# Patient Record
Sex: Male | Born: 1935 | Race: White | Hispanic: No | Marital: Married | Smoking: Never smoker
Health system: Southern US, Community
[De-identification: ages and names within clinical notes are randomized; demographics above are authoritative.]

## PROBLEM LIST (undated history)

## (undated) DIAGNOSIS — D649 Anemia, unspecified: Secondary | ICD-10-CM

## (undated) DIAGNOSIS — I10 Essential (primary) hypertension: Secondary | ICD-10-CM

## (undated) DIAGNOSIS — R627 Adult failure to thrive: Secondary | ICD-10-CM

## (undated) DIAGNOSIS — K635 Polyp of colon: Secondary | ICD-10-CM

## (undated) DIAGNOSIS — E039 Hypothyroidism, unspecified: Secondary | ICD-10-CM

## (undated) DIAGNOSIS — N400 Enlarged prostate without lower urinary tract symptoms: Secondary | ICD-10-CM

## (undated) DIAGNOSIS — M75122 Complete rotator cuff tear or rupture of left shoulder, not specified as traumatic: Secondary | ICD-10-CM

## (undated) DIAGNOSIS — K5900 Constipation, unspecified: Secondary | ICD-10-CM

## (undated) DIAGNOSIS — T148XXA Other injury of unspecified body region, initial encounter: Secondary | ICD-10-CM

## (undated) DIAGNOSIS — L089 Local infection of the skin and subcutaneous tissue, unspecified: Secondary | ICD-10-CM

## (undated) DIAGNOSIS — K5790 Diverticulosis of intestine, part unspecified, without perforation or abscess without bleeding: Secondary | ICD-10-CM

## (undated) DIAGNOSIS — N3281 Overactive bladder: Secondary | ICD-10-CM

## (undated) DIAGNOSIS — J189 Pneumonia, unspecified organism: Secondary | ICD-10-CM

## (undated) DIAGNOSIS — M545 Low back pain: Secondary | ICD-10-CM

## (undated) DIAGNOSIS — L209 Atopic dermatitis, unspecified: Secondary | ICD-10-CM

## (undated) DIAGNOSIS — F0390 Unspecified dementia without behavioral disturbance: Secondary | ICD-10-CM

## (undated) DIAGNOSIS — I70219 Atherosclerosis of native arteries of extremities with intermittent claudication, unspecified extremity: Secondary | ICD-10-CM

## (undated) HISTORY — DX: Adult failure to thrive: R62.7

## (undated) HISTORY — DX: Atherosclerosis of native arteries of extremities with intermittent claudication, unspecified extremity: I70.219

## (undated) HISTORY — DX: Benign prostatic hyperplasia without lower urinary tract symptoms: N40.0

## (undated) HISTORY — DX: Overactive bladder: N32.81

## (undated) HISTORY — DX: Essential (primary) hypertension: I10

## (undated) HISTORY — DX: Constipation, unspecified: K59.00

## (undated) HISTORY — DX: Low back pain: M54.5

## (undated) HISTORY — DX: Anemia, unspecified: D64.9

## (undated) HISTORY — DX: Complete rotator cuff tear or rupture of left shoulder, not specified as traumatic: M75.122

## (undated) HISTORY — DX: Hypothyroidism, unspecified: E03.9

## (undated) HISTORY — PX: COLONOSCOPY: SHX174

## (undated) HISTORY — DX: Local infection of the skin and subcutaneous tissue, unspecified: L08.9

## (undated) HISTORY — DX: Diverticulosis of intestine, part unspecified, without perforation or abscess without bleeding: K57.90

## (undated) HISTORY — DX: Pneumonia, unspecified organism: J18.9

## (undated) HISTORY — DX: Atopic dermatitis, unspecified: L20.9

## (undated) HISTORY — DX: Other injury of unspecified body region, initial encounter: T14.8XXA

## (undated) HISTORY — DX: Polyp of colon: K63.5

## (undated) HISTORY — PX: EYE SURGERY: SHX253

---

## 1970-06-09 HISTORY — PX: HEMORRHOID SURGERY: SHX153

## 1990-07-10 HISTORY — PX: VITRECTOMY: SHX106

## 1995-04-10 HISTORY — PX: INGUINAL HERNIA REPAIR: SUR1180

## 2001-01-08 ENCOUNTER — Other Ambulatory Visit: Admission: RE | Admit: 2001-01-08 | Discharge: 2001-01-08 | Payer: Self-pay | Admitting: Gastroenterology

## 2001-01-08 ENCOUNTER — Encounter (INDEPENDENT_AMBULATORY_CARE_PROVIDER_SITE_OTHER): Payer: Self-pay | Admitting: Specialist

## 2002-12-29 ENCOUNTER — Encounter: Admission: RE | Admit: 2002-12-29 | Discharge: 2002-12-29 | Payer: Self-pay | Admitting: Endocrinology

## 2002-12-29 ENCOUNTER — Encounter: Payer: Self-pay | Admitting: Endocrinology

## 2006-07-24 ENCOUNTER — Inpatient Hospital Stay (HOSPITAL_COMMUNITY): Admission: EM | Admit: 2006-07-24 | Discharge: 2006-07-31 | Payer: Self-pay | Admitting: Emergency Medicine

## 2006-10-15 ENCOUNTER — Ambulatory Visit: Payer: Self-pay | Admitting: Gastroenterology

## 2006-10-16 ENCOUNTER — Encounter: Admission: RE | Admit: 2006-10-16 | Discharge: 2006-10-16 | Payer: Self-pay | Admitting: Endocrinology

## 2006-11-03 ENCOUNTER — Ambulatory Visit: Payer: Self-pay | Admitting: Gastroenterology

## 2007-12-05 ENCOUNTER — Encounter: Admission: RE | Admit: 2007-12-05 | Discharge: 2007-12-05 | Payer: Self-pay | Admitting: Endocrinology

## 2009-10-07 DEATH — deceased

## 2010-08-05 ENCOUNTER — Other Ambulatory Visit: Payer: MEDICARE

## 2010-08-05 ENCOUNTER — Other Ambulatory Visit: Payer: Self-pay | Admitting: Internal Medicine

## 2010-08-05 ENCOUNTER — Ambulatory Visit (INDEPENDENT_AMBULATORY_CARE_PROVIDER_SITE_OTHER): Payer: MEDICARE | Admitting: Internal Medicine

## 2010-08-05 ENCOUNTER — Encounter: Payer: Self-pay | Admitting: Internal Medicine

## 2010-08-05 DIAGNOSIS — E039 Hypothyroidism, unspecified: Secondary | ICD-10-CM

## 2010-08-05 DIAGNOSIS — I10 Essential (primary) hypertension: Secondary | ICD-10-CM

## 2010-08-05 DIAGNOSIS — D649 Anemia, unspecified: Secondary | ICD-10-CM

## 2010-08-05 DIAGNOSIS — D518 Other vitamin B12 deficiency anemias: Secondary | ICD-10-CM

## 2010-08-05 HISTORY — DX: Essential (primary) hypertension: I10

## 2010-08-05 HISTORY — DX: Hypothyroidism, unspecified: E03.9

## 2010-08-05 LAB — CBC WITH DIFFERENTIAL/PLATELET
Eosinophils Relative: 2.7 % (ref 0.0–5.0)
MCV: 103 fl — ABNORMAL HIGH (ref 78.0–100.0)
Monocytes Absolute: 0.7 10*3/uL (ref 0.1–1.0)
Monocytes Relative: 12.1 % — ABNORMAL HIGH (ref 3.0–12.0)
Neutrophils Relative %: 53 % (ref 43.0–77.0)
Platelets: 192 10*3/uL (ref 150.0–400.0)
WBC: 5.7 10*3/uL (ref 4.5–10.5)

## 2010-08-05 LAB — BASIC METABOLIC PANEL
BUN: 25 mg/dL — ABNORMAL HIGH (ref 6–23)
Calcium: 9.7 mg/dL (ref 8.4–10.5)
Creatinine, Ser: 1 mg/dL (ref 0.4–1.5)
GFR: 77.16 mL/min (ref >60.00)

## 2010-08-05 LAB — B12 AND FOLATE PANEL: Vitamin B-12: 488 pg/mL (ref 211–911)

## 2010-08-05 LAB — TSH: TSH: 1.45 u[IU]/mL (ref 0.35–5.50)

## 2010-08-12 ENCOUNTER — Telehealth: Payer: Self-pay | Admitting: Internal Medicine

## 2010-08-13 ENCOUNTER — Ambulatory Visit: Payer: MEDICARE

## 2010-08-15 NOTE — Assessment & Plan Note (Signed)
Summary: NEW/ MEDICARE /[place]   Vital Signs:  Patient profile:   77 year old male Height:      71 inches Weight:      135 pounds BMI:     18.90 O2 Sat:      98 % on Room air Temp:     97.3 degrees F oral Pulse rate:   67 / minute Pulse rhythm:   regular Resp:     16 per minute BP sitting:   120 / 70  (left arm) Cuff size:   regular  Vitals Entered By: Lamar Sprinkles, CMA (August 05, 2010 3:06 PM)  O2 Flow:  Room air CC: New to est/SD Is Patient Diabetic? No Pain Assessment Patient in pain? no        Primary Care Provider:  Etta Grandchild MD  CC:  New to est/SD.  History of Present Illness: New to me he needs a new PCP. Follow-Up Visit      This is an 77 year old man who presents for Follow-up visit.  The patient denies chest pain, palpitations, dizziness, syncope, low blood sugar symptoms, high blood sugar symptoms, edema, SOB, DOE, PND, and orthopnea.  Since the last visit the patient notes no new problems or concerns.  The patient reports taking meds as prescribed, monitoring BP, and dietary compliance.  When questioned about possible medication side effects, the patient notes none.    Preventive Screening-Counseling & Management  Alcohol-Tobacco     Alcohol drinks/day: <1     Alcohol type: beer     >5/day in last 3 mos: no     Alcohol Counseling: not indicated; use of alcohol is not excessive or problematic     Feels need to cut down: no     Feels annoyed by complaints: no     Feels guilty re: drinking: no     Needs 'eye opener' in am: no     Smoking Status: never     Tobacco Counseling: not indicated; no tobacco use  Caffeine-Diet-Exercise     Does Patient Exercise: yes  Hep-HIV-STD-Contraception     Hepatitis Risk: no risk noted     HIV Risk: risk noted     STD Risk: no risk noted      Sexual History:  currently monogamous.        Drug Use:  yes.        Blood Transfusions:  no.    Medications Prior to Update: 1)  None  Current Medications  (verified): 1)  Synthroid 75 Mcg Tabs (Levothyroxine Sodium) .Marland Kitchen.. 1 Once Daily 2)  Triamterene-Hctz 37.5-25 Mg Tabs (Triamterene-Hctz) .... 1/2 Once Daily  Allergies (verified): No Known Drug Allergies  Past History:  Past Medical History: Anemia-NOS Hypertension Hypothyroidism  Past Surgical History: Denies surgical history  Family History: Family History Lung cancer  Social History: Retired Married Never Smoked Alcohol use-yes Drug use-yes Regular exercise-yes Smoking Status:  never Drug Use:  yes Does Patient Exercise:  yes Hepatitis Risk:  no risk noted HIV Risk:  risk noted STD Risk:  no risk noted Sexual History:  currently monogamous Blood Transfusions:  no  Review of Systems  The patient denies anorexia, fever, weight loss, weight gain, chest pain, syncope, dyspnea on exertion, peripheral edema, prolonged cough, headaches, hemoptysis, abdominal pain, hematuria, suspicious skin lesions, transient blindness, difficulty walking, depression, enlarged lymph nodes, and angioedema.   Endo:  Denies cold intolerance, excessive hunger, excessive thirst, excessive urination, heat intolerance, polyuria, and weight change. Heme:  Denies abnormal bruising, bleeding, enlarge lymph nodes, fevers, pallor, and skin discoloration.  Physical Exam  General:  alert, well-developed, well-nourished, well-hydrated, appropriate dress, normal appearance, healthy-appearing, cooperative to examination, good hygiene, and underweight appearing.   Head:  normocephalic, atraumatic, no abnormalities observed, and no abnormalities palpated.   Eyes:  vision grossly intact, pupils equal, pupils round, and pupils reactive to light.   Ears:  R ear normal and L ear normal.   Nose:  External nasal examination shows no deformity or inflammation. Nasal mucosa are pink and moist without lesions or exudates. Mouth:  Oral mucosa and oropharynx without lesions or exudates.  Teeth in good repair. Neck:   supple, full ROM, no masses, no thyromegaly, no thyroid nodules or tenderness, no JVD, no HJR, and normal carotid upstroke.   Lungs:  normal respiratory effort, no intercostal retractions, no accessory muscle use, normal breath sounds, no dullness, no fremitus, no crackles, and no wheezes.   Heart:  normal rate, regular rhythm, no murmur, no gallop, no rub, and no JVD.   Abdomen:  soft, non-tender, normal bowel sounds, no distention, no masses, no guarding, no rigidity, no rebound tenderness, no abdominal hernia, no inguinal hernia, no hepatomegaly, and no splenomegaly.   Msk:  No deformity or scoliosis noted of thoracic or lumbar spine.   Pulses:  R and L carotid,radial,femoral,dorsalis pedis and posterior tibial pulses are full and equal bilaterally Extremities:  No clubbing, cyanosis, edema, or deformity noted with normal full range of motion of all joints.   Neurologic:  No cranial nerve deficits noted. Station and gait are normal. Plantar reflexes are down-going bilaterally. DTRs are symmetrical throughout. Sensory, motor and coordinative functions appear intact. Skin:  Intact without suspicious lesions or rashes Cervical Nodes:  No lymphadenopathy noted Psych:  Cognition and judgment appear intact. Alert and cooperative with normal attention span and concentration. No apparent delusions, illusions, hallucinations   Impression & Recommendations:  Problem # 1:  HYPOTHYROIDISM (ICD-244.9) Assessment Unchanged  His updated medication list for this problem includes:    Synthroid 75 Mcg Tabs (Levothyroxine sodium) .Marland Kitchen... 1 once daily  Orders: Venipuncture (34742) TLB-BMP (Basic Metabolic Panel-BMET) (80048-METABOL) TLB-CBC Platelet - w/Differential (85025-CBCD) TLB-TSH (Thyroid Stimulating Hormone) (84443-TSH) TLB-B12 + Folate Pnl (59563_87564-P32/RJJ)  Problem # 2:  HYPERTENSION (ICD-401.9) Assessment: Unchanged  His updated medication list for this problem includes:     Triamterene-hctz 37.5-25 Mg Tabs (Triamterene-hctz) .Marland Kitchen... 1/2 once daily  Orders: Venipuncture (88416) TLB-BMP (Basic Metabolic Panel-BMET) (80048-METABOL) TLB-CBC Platelet - w/Differential (85025-CBCD) TLB-TSH (Thyroid Stimulating Hormone) (84443-TSH) TLB-B12 + Folate Pnl (60630_16010-X32/TFT)  BP today: 120/70  Problem # 3:  ANEMIA, B12 DEFICIENCY (ICD-281.1) Assessment: Unchanged  Orders: Venipuncture (73220) TLB-BMP (Basic Metabolic Panel-BMET) (80048-METABOL) TLB-CBC Platelet - w/Differential (85025-CBCD) TLB-TSH (Thyroid Stimulating Hormone) (84443-TSH) TLB-B12 + Folate Pnl (25427_06237-S28/BTD)  Complete Medication List: 1)  Synthroid 75 Mcg Tabs (Levothyroxine sodium) .Marland Kitchen.. 1 once daily 2)  Triamterene-hctz 37.5-25 Mg Tabs (Triamterene-hctz) .... 1/2 once daily  Colorectal Screening:  Current Recommendations:    Hemoccult: patient refused  Colonoscopy Results:    Date of Exam: 08/24/2007    Results: Normal  Immunization & Chemoprophylaxis:    Tetanus vaccine: Tdap (State)  (09/08/2007)    Influenza vaccine: Historical  (03/14/2010)    Pneumovax: Pneumovax (Medicare)  (03/10/2007)  Patient Instructions: 1)  Please schedule a follow-up appointment in 4 months. 2)  Check your Blood Pressure regularly. If it is above 130/80: you should make an appointment.   Orders Added: 1)  Venipuncture [17616] 2)  TLB-BMP (Basic Metabolic Panel-BMET) [80048-METABOL] 3)  TLB-CBC Platelet - w/Differential [85025-CBCD] 4)  TLB-TSH (Thyroid Stimulating Hormone) [84443-TSH] 5)  TLB-B12 + Folate Pnl [82746_82607-B12/FOL] 6)  New Patient Level IV [16109]   Immunization History:  Tetanus/Td Immunization History:    Tetanus/Td:  tdap (state) (09/08/2007)  Influenza Immunization History:    Influenza:  historical (03/14/2010)  Pneumovax Immunization History:    Pneumovax:  pneumovax (medicare) (03/10/2007)   Immunization History:  Tetanus/Td Immunization History:     Tetanus/Td:  Tdap (State) (09/08/2007)  Influenza Immunization History:    Influenza:  Historical (03/14/2010)  Pneumovax Immunization History:    Pneumovax:  Pneumovax (Medicare) (03/10/2007)

## 2010-08-20 NOTE — Progress Notes (Signed)
Summary: B12?   Phone Note Call from Patient Call back at [facility] Phone [phone]   Summary of Call: Pt states that he gets b12 injections once monthly. OK to schedule nurse visit for this?  Initial call taken by: Lamar Sprinkles, CMA,  August 12, 2010 11:06 AM  Follow-up for Phone Call        yes Follow-up by: Etta Grandchild MD,  August 12, 2010 11:26 AM  Additional Follow-up for Phone Call Additional follow up Details #1::        Please set pt up for b12 nurse visit once monthly. THANKS Additional Follow-up by: Lamar Sprinkles, CMA,  August 12, 2010 11:31 AM    Additional Follow-up for Phone Call Additional follow up Details #2::    PT IS COMING TUES AT 10 FOR B12. Follow-up by: Hilarie Fredrickson,  August 12, 2010 4:16 PM

## 2010-09-09 ENCOUNTER — Ambulatory Visit (INDEPENDENT_AMBULATORY_CARE_PROVIDER_SITE_OTHER): Payer: MEDICARE | Admitting: *Deleted

## 2010-09-09 DIAGNOSIS — D518 Other vitamin B12 deficiency anemias: Secondary | ICD-10-CM

## 2010-09-09 DIAGNOSIS — D519 Vitamin B12 deficiency anemia, unspecified: Secondary | ICD-10-CM

## 2010-09-09 MED ORDER — CYANOCOBALAMIN 1000 MCG/ML IJ SOLN
1000.0000 ug | Freq: Once | INTRAMUSCULAR | Status: AC
  Administered 2010-09-09: 1000 ug via INTRAMUSCULAR

## 2010-09-19 ENCOUNTER — Telehealth: Payer: Self-pay | Admitting: Internal Medicine

## 2010-09-19 NOTE — Telephone Encounter (Signed)
Forwarded to Dr. Jones for review. °

## 2010-10-09 ENCOUNTER — Ambulatory Visit (INDEPENDENT_AMBULATORY_CARE_PROVIDER_SITE_OTHER): Payer: MEDICARE | Admitting: *Deleted

## 2010-10-09 DIAGNOSIS — E538 Deficiency of other specified B group vitamins: Secondary | ICD-10-CM

## 2010-10-09 DIAGNOSIS — D518 Other vitamin B12 deficiency anemias: Secondary | ICD-10-CM

## 2010-10-09 MED ORDER — CYANOCOBALAMIN 1000 MCG/ML IJ SOLN
1000.0000 ug | Freq: Once | INTRAMUSCULAR | Status: AC
  Administered 2010-10-09: 1000 ug via INTRAMUSCULAR

## 2010-10-25 NOTE — Op Note (Signed)
NAME:  Connor Miller, TANT NO.:  [id number]   MEDICAL RECORD NO.:  [id number]          PATIENT TYPE:  INP   LOCATION:  5013                         FACILITY:  [facility]   PHYSICIAN:  Mark C. Ophelia Charter, M.D.    DATE OF BIRTH:  [birth year]   DATE OF PROCEDURE:  DATE OF DISCHARGE:  07/31/2006                               OPERATIVE REPORT   FINAL DIAGNOSES:  1. Left intertrochanteric hip fracture after a fall.  2. Acute blood loss anemia secondary to intertrochanteric hip      fracture.  3. Hypothyroidism.  4. Hypertension.  5. History of glaucoma.   This 77 year old male who is active and is a good ambulator, was  trimming some trees, fell off a 4-foot ladder suffering left  intertrochanteric hip fracture.   ADMISSION MEDICATIONS:  Include:  1. Synthroid 0.1 mg daily.  2. Triamterene 37.5 mg.  3. Cosopt left eye drops 1 drop b.i.d.   He was admitted and after informed consent, he was taken to the  operating room and underwent intermedullary hip screw fixation on  July 24, 2006 with a long Synthes intramedullary rod that went down  to the patella.  X-rays postop looked good.  Postop hemoglobin was 9.2.  Due to a severe comminuted fracture, it was decided by the operating  surgeon, Dr. Ophelia Charter, to put the patient on subtherapeutic Coumadin  treatment due to expected blood loss from the severe comminuted fracture  and high energy of impact.  He did have a glucose of 112.  He was placed  on an ADA diet.  Followup hemoglobin the next day with an INR of 1.3  showed his hemoglobin dropped to 7.7 with hematocrit of 21.8 and he  required transfusion.  Post transfusion of 2 units of blood, his  hemoglobin was 10, INR remained 1.3.  There was no evidence of DVT.  He  was ambulatory 10 feet.  Foley was removed on postop day #3.  He was  voiding well.  Hemoglobin remained stable at 10.  He was seen by PT, OT  and made excellent progress.  He was ambulatory down the halls with a  walker.   PLAN:  Coumadin 1 mg daily x3 weeks which is subtherapeutic as planned.  INR was stable 1.3.  He was taking Tylox for pain.  X-ray showed  excellent position, wound was dry.  Office followup 1 week after  discharge to see Dr. Ophelia Charter.   CONDITION AT DISCHARGE:  Improved.   DISCHARGE MEDICATIONS:  Same as admission medications with the addition  of Coumadin 1 mg daily x3 weeks and Tylox for pain.      Mark C. Ophelia Charter, M.D.  Electronically Signed     MCY/MEDQ  D:  09/04/2006  T:  09/04/2006  Job:  [id number]

## 2010-10-25 NOTE — Op Note (Signed)
NAME:  Connor Miller, Connor Miller NO.:  [id number]   MEDICAL RECORD NO.:  [id number]          PATIENT TYPE:  INP   LOCATION:  1843                         FACILITY:  [facility]   PHYSICIAN:  Mark C. Ophelia Charter, M.D.    DATE OF BIRTH:  08-05-1922   DATE OF PROCEDURE:  07/24/2006  DATE OF DISCHARGE:                               OPERATIVE REPORT   PREOPERATIVE DIAGNOSIS:  Left four-part intertrochanteric hip fracture.   POSTOPERATIVE DIAGNOSIS:  Left four-part intertrochanteric hip fracture.   PROCEDURE PERFORMED:  Left intramedullary hip screw.   SURGEON:  Mark C. Ophelia Charter, M.D.   ANESTHESIA:  GOT.   ESTIMATED BLOOD LOSS:  100 mL.   COMPONENTS USED:  Synthes intramedullary hip screw 110 lag screw, 11 mm  x 420 mm nail.   PROCEDURE:  After induction of general anesthesia, reduction  confirmation under fluoroscopy with traction internal rotation, AP and  lateral x-ray were checked, fluoroscopic views with good positioning of  the lamina and standard prep with DuraPrep, the area was spread with  towels, large shower curtain, Betadine body Vi-Drape was applied.  Timeout was taken.  Preoperative Ancef had been given.  Incision was  made proximal to trochanter.  Fascia over the gluteus medius was split.  K wire was drilled into the appropriate position. Intramedullary reaming  was performed and then measurement over the top with a long ruler and  placement of a 420 mm nail.  The nail went down to the mid portion of  the patella on AP view.  It was currently tight in the intramedullary  canal.  Incision was made laterally and the thumb screw was pushed,  tightened down securely against the lateral cortex, reamed, measured and  then 110 mm lag screw was then placed center-center on AP and lateral  with good position and alignment.  After locking the lag screw with hex  head, thumb bolt was pushed.  K wire was removed from its placement  center-center in the head.  Entire apparatus AP and  lateral was checked  under fluoroscopy with excellent position.  Spinal films taken for  confirmation.  Wound irrigated, closed with 0 Vicryl in the fascia, 2-0  in subcutaneous tissue, skin stapled closed after Marcaine infiltration  in the skin and postop dressing.  Instrument count and needle count was  correct.      Mark C. Ophelia Charter, M.D.  Electronically Signed     MCY/MEDQ  D:  07/24/2006  T:  07/25/2006  Job:  [id number]

## 2010-11-12 ENCOUNTER — Encounter: Payer: Self-pay | Admitting: Internal Medicine

## 2010-11-13 ENCOUNTER — Encounter: Payer: Self-pay | Admitting: Internal Medicine

## 2010-11-13 ENCOUNTER — Other Ambulatory Visit (INDEPENDENT_AMBULATORY_CARE_PROVIDER_SITE_OTHER): Payer: MEDICARE

## 2010-11-13 ENCOUNTER — Ambulatory Visit (INDEPENDENT_AMBULATORY_CARE_PROVIDER_SITE_OTHER): Payer: MEDICARE | Admitting: Internal Medicine

## 2010-11-13 VITALS — BP 114/70 | HR 59 | Temp 97.4°F | Resp 16 | Wt 137.0 lb

## 2010-11-13 DIAGNOSIS — I10 Essential (primary) hypertension: Secondary | ICD-10-CM

## 2010-11-13 DIAGNOSIS — D649 Anemia, unspecified: Secondary | ICD-10-CM

## 2010-11-13 DIAGNOSIS — E039 Hypothyroidism, unspecified: Secondary | ICD-10-CM

## 2010-11-13 DIAGNOSIS — D519 Vitamin B12 deficiency anemia, unspecified: Secondary | ICD-10-CM

## 2010-11-13 DIAGNOSIS — D518 Other vitamin B12 deficiency anemias: Secondary | ICD-10-CM

## 2010-11-13 LAB — CBC WITH DIFFERENTIAL/PLATELET
Basophils Relative: 0.3 % (ref 0.0–3.0)
Eosinophils Relative: 2.5 % (ref 0.0–5.0)
HCT: 36.7 % — ABNORMAL LOW (ref 39.0–52.0)
Hemoglobin: 12.6 g/dL — ABNORMAL LOW (ref 13.0–17.0)
Lymphs Abs: 1.6 10*3/uL (ref 0.7–4.0)
Monocytes Relative: 13.1 % — ABNORMAL HIGH (ref 3.0–12.0)
Neutro Abs: 2.3 10*3/uL (ref 1.4–7.7)
RBC: 3.57 Mil/uL — ABNORMAL LOW (ref 4.22–5.81)
RDW: 14.3 % (ref 11.5–14.6)
WBC: 4.7 10*3/uL (ref 4.5–10.5)

## 2010-11-13 LAB — TSH: TSH: 1.35 u[IU]/mL (ref 0.35–5.50)

## 2010-11-13 LAB — COMPREHENSIVE METABOLIC PANEL
ALT: 17 U/L (ref 0–53)
CO2: 33 mEq/L — ABNORMAL HIGH (ref 19–32)
Creatinine, Ser: 1.1 mg/dL (ref 0.4–1.5)
GFR: 70.44 mL/min (ref >60.00)
Total Bilirubin: 0.6 mg/dL (ref 0.3–1.2)

## 2010-11-13 MED ORDER — CYANOCOBALAMIN 1000 MCG/ML IJ SOLN
1000.0000 ug | Freq: Once | INTRAMUSCULAR | Status: AC
  Administered 2010-11-13: 1000 ug via INTRAMUSCULAR

## 2010-11-13 NOTE — Assessment & Plan Note (Signed)
He got a B12 injection today

## 2010-11-13 NOTE — Assessment & Plan Note (Signed)
I will check his CBC today

## 2010-11-13 NOTE — Assessment & Plan Note (Signed)
His fatigue is concerning, I will check his TSH level today

## 2010-11-13 NOTE — Patient Instructions (Signed)
Hypothyroidism The thyroid is a large gland located in the lower front of your neck. The thyroid gland helps control metabolism. Metabolism is how your body handles food. It controls metabolism with the hormone thyroxine. When this gland is underactive (hypothyroid), it produces too little hormone.  SYMPTOMS OF HYPOTHYROIDISM  Lethargy (feeling as though you have no energy)   Cold intolerance   Weight gain (in spite of normal food intake)   Dry skin   Coarse hair   Menstrual irregularity (if severe, may lead to infertility)   Slowing of thought processes  Cardiac problems are also caused by insufficient amounts of thyroid hormone. Hypothyroidism in the newborn is cretinism, and is an extreme form. It is important that this form be treated adequately and immediately or it will lead rapidly to retarded physical and mental development. CAUSES OF HYPOTHYROIDISM These include:   Absence or destruction of thyroid tissue.  Goiter due to iodine deficiency.   Goiter due to medications.   Congenital defects (since birth).  Problems with the pituitary. This causes a lack of TSH (thyroid stimulating hormone). This hormone tells the thyroid to turn out more hormone.   DIAGNOSIS To prove hypothyroidism, your caregiver may do blood tests and ultrasound tests. Sometimes the signs are hidden. It may be necessary for your caregiver to watch this illness with blood tests either before or after diagnosis and treatment. TREATMENT  Low levels of thyroid hormone are increased by using synthetic thyroid hormone. This is a safe, effective treatment. It usually takes about four weeks to gain the full effects of the medication. After you have the full effect of the medication, it will generally take another four weeks for problems to leave. Your caregiver may start you on low doses. If you have had heart problems the dose may be gradually increased. It is generally not an emergency to get rapidly to  normal. HOME CARE INSTRUCTIONS  Take your medications as your caregiver suggests. Let your caregiver know of any medications you are taking or start taking. Your caregiver will help you with dosage schedules.   As your condition improves, your dosage needs may increase. It will be necessary to have continuing blood tests as suggested by your caregiver.   Report all suspected medication side effects to your caregiver.  SEEK MEDICAL CARE IF YOU DEVELOP:  Sweating.  Tremulousness (tremors).   Anxiety.   Rapid weight loss.   Heat intolerance.  Emotional swings.   Diarrhea.   Weakness.   SEEK IMMEDIATE MEDICAL CARE IF: You develop chest pain, an irregular heart beat (palpitations), or a rapid heart beat. MAKE SURE YOU:   Understand these instructions.   Will watch your condition.   Will get help right away if you are not doing well or get worse.  Document Released: 05/26/2005 Document Re-Released: 05/08/2008 [employer] Patient Information 2011 [employer]. 

## 2010-11-13 NOTE — Progress Notes (Signed)
Subjective:    Patient ID: Connor Miller, male    DOB: 09/12/1922, 77 y.o.   MRN: [id number]  Thyroid Problem Presents for follow-up visit. Symptoms include fatigue. Patient reports no anxiety, cold intolerance, constipation, depressed mood, diaphoresis, diarrhea, dry skin, hair loss, heat intolerance, hoarse voice, leg swelling, menstrual problem, nail problem, palpitations, tremors, visual change, weight gain or weight loss. The symptoms have been stable.  Hypertension This is a chronic problem. The current episode started more than 1 year ago. The problem has been gradually improving since onset. The problem is controlled. Pertinent negatives include no anxiety, blurred vision, chest pain, headaches, malaise/fatigue, neck pain, orthopnea, palpitations, peripheral edema, PND, shortness of breath or sweats. Past treatments include diuretics. The current treatment provides significant improvement. There are no compliance problems.  Hypertensive end-organ damage includes a thyroid problem.      Review of Systems  Constitutional: Positive for fatigue. Negative for fever, chills, weight loss, weight gain, malaise/fatigue, diaphoresis, activity change, appetite change and unexpected weight change.  HENT: Negative for sore throat, hoarse voice, facial swelling, trouble swallowing, neck pain, neck stiffness and voice change.   Eyes: Negative for blurred vision, photophobia and visual disturbance.  Respiratory: Negative for apnea, cough, choking, chest tightness, shortness of breath, wheezing and stridor.   Cardiovascular: Negative for chest pain, palpitations, orthopnea, leg swelling and PND.  Gastrointestinal: Negative for nausea, vomiting, abdominal pain, diarrhea, constipation and blood in stool.  Genitourinary: Negative for dysuria, urgency, frequency, hematuria, flank pain, decreased urine volume, enuresis, difficulty urinating, genital sores and menstrual problem.  Musculoskeletal: Negative for  myalgias, back pain, joint swelling, arthralgias and gait problem.  Skin: Negative for color change, pallor and rash.  Neurological: Negative for dizziness, tremors, seizures, syncope, facial asymmetry, speech difficulty, weakness, light-headedness, numbness and headaches.  Hematological: Negative for cold intolerance, heat intolerance and adenopathy. Does not bruise/bleed easily.  Psychiatric/Behavioral: Negative.        Objective:   Physical Exam  [vitalsreviewed. Constitutional: He is oriented to person, place, and time. He appears well-developed and well-nourished. No distress.  HENT:  Head: Normocephalic and atraumatic.  Right Ear: External ear normal.  Left Ear: External ear normal.  Nose: Nose normal.  Mouth/Throat: Oropharynx is clear and moist. No oropharyngeal exudate.  Eyes: Conjunctivae and EOM are normal. Pupils are equal, round, and reactive to light. Right eye exhibits no discharge. Left eye exhibits no discharge. No scleral icterus.  Neck: Normal range of motion. Neck supple. No JVD present. No tracheal deviation present. No thyromegaly present.  Cardiovascular: Normal rate, regular rhythm, normal heart sounds and intact distal pulses.  Exam reveals no gallop and no friction rub.   No murmur heard. Pulmonary/Chest: Effort normal and breath sounds normal. No stridor. No respiratory distress. He has no wheezes. He has no rales. He exhibits no tenderness.  Abdominal: Soft. Bowel sounds are normal. He exhibits no distension and no mass. There is no tenderness. There is no rebound and no guarding.  Musculoskeletal: Normal range of motion. He exhibits no edema and no tenderness.  Lymphadenopathy:    He has no cervical adenopathy.  Neurological: He is alert and oriented to person, place, and time. He has normal reflexes. He displays normal reflexes. No cranial nerve deficit. He exhibits normal muscle tone. Coordination normal.  Skin: Skin is warm and dry. No rash noted. He is not  diaphoretic. No erythema. No pallor.  Psychiatric: He has a normal mood and affect. His behavior is normal. Judgment and thought content  normal.        Lab Results  Component Value Date   WBC 5.7 08/05/2010   HGB 13.0 08/05/2010   HCT 37.9* 08/05/2010   PLT 192.0 08/05/2010   NA 141 08/05/2010   K 4.3 08/05/2010   CL 103 08/05/2010   CREATININE 1.0 08/05/2010   BUN 25* 08/05/2010   CO2 33* 08/05/2010   TSH 1.45 08/05/2010    Assessment & Plan:

## 2010-11-13 NOTE — Assessment & Plan Note (Signed)
His BP is well controlled, today I will monitor his lytes and renal function 

## 2010-11-14 ENCOUNTER — Encounter: Payer: Self-pay | Admitting: Internal Medicine

## 2010-11-19 ENCOUNTER — Other Ambulatory Visit: Payer: Self-pay | Admitting: Dermatology

## 2010-12-12 ENCOUNTER — Ambulatory Visit (INDEPENDENT_AMBULATORY_CARE_PROVIDER_SITE_OTHER): Payer: MEDICARE

## 2010-12-12 DIAGNOSIS — D518 Other vitamin B12 deficiency anemias: Secondary | ICD-10-CM

## 2010-12-12 DIAGNOSIS — D519 Vitamin B12 deficiency anemia, unspecified: Secondary | ICD-10-CM

## 2010-12-12 MED ORDER — CYANOCOBALAMIN 1000 MCG/ML IJ SOLN
1000.0000 ug | Freq: Once | INTRAMUSCULAR | Status: AC
  Administered 2010-12-12: 1000 ug via INTRAMUSCULAR

## 2010-12-13 ENCOUNTER — Ambulatory Visit: Payer: MEDICARE

## 2011-01-08 ENCOUNTER — Ambulatory Visit (INDEPENDENT_AMBULATORY_CARE_PROVIDER_SITE_OTHER): Payer: MEDICARE | Admitting: *Deleted

## 2011-01-08 DIAGNOSIS — D518 Other vitamin B12 deficiency anemias: Secondary | ICD-10-CM

## 2011-01-08 MED ORDER — CYANOCOBALAMIN 1000 MCG/ML IJ SOLN
1000.0000 ug | Freq: Once | INTRAMUSCULAR | Status: AC
  Administered 2011-01-08: 1000 ug via INTRAMUSCULAR

## 2011-02-11 ENCOUNTER — Ambulatory Visit (INDEPENDENT_AMBULATORY_CARE_PROVIDER_SITE_OTHER): Payer: MEDICARE

## 2011-02-11 DIAGNOSIS — E538 Deficiency of other specified B group vitamins: Secondary | ICD-10-CM

## 2011-02-11 MED ORDER — CYANOCOBALAMIN 1000 MCG/ML IJ SOLN
1000.0000 ug | Freq: Once | INTRAMUSCULAR | Status: AC
  Administered 2011-02-11: 1000 ug via INTRAMUSCULAR

## 2011-03-19 ENCOUNTER — Ambulatory Visit (INDEPENDENT_AMBULATORY_CARE_PROVIDER_SITE_OTHER): Payer: MEDICARE | Admitting: Internal Medicine

## 2011-03-19 ENCOUNTER — Encounter: Payer: Self-pay | Admitting: Internal Medicine

## 2011-03-19 VITALS — BP 110/78 | HR 64 | Temp 97.5°F | Resp 16 | Wt 134.0 lb

## 2011-03-19 DIAGNOSIS — D649 Anemia, unspecified: Secondary | ICD-10-CM

## 2011-03-19 DIAGNOSIS — D518 Other vitamin B12 deficiency anemias: Secondary | ICD-10-CM

## 2011-03-19 DIAGNOSIS — I10 Essential (primary) hypertension: Secondary | ICD-10-CM

## 2011-03-19 DIAGNOSIS — E538 Deficiency of other specified B group vitamins: Secondary | ICD-10-CM

## 2011-03-19 DIAGNOSIS — E039 Hypothyroidism, unspecified: Secondary | ICD-10-CM

## 2011-03-19 DIAGNOSIS — Z23 Encounter for immunization: Secondary | ICD-10-CM

## 2011-03-19 MED ORDER — TRIAMTERENE-HCTZ 37.5-25 MG PO CAPS: 1.0000 | ORAL_CAPSULE | Freq: Every day | ORAL | Status: DC

## 2011-03-19 MED ORDER — CYANOCOBALAMIN 1000 MCG/ML IJ SOLN
1000.0000 ug | Freq: Once | INTRAMUSCULAR | Status: AC
  Administered 2011-03-19: 1000 ug via INTRAMUSCULAR

## 2011-03-19 MED ORDER — LEVOTHYROXINE SODIUM 75 MCG PO TABS: 75.0000 ug | ORAL_TABLET | Freq: Every day | ORAL | Status: DC

## 2011-03-19 NOTE — Assessment & Plan Note (Signed)
This is stable 

## 2011-03-19 NOTE — Patient Instructions (Signed)

## 2011-03-19 NOTE — Assessment & Plan Note (Signed)
Continue current synthroid dose.

## 2011-03-19 NOTE — Assessment & Plan Note (Signed)
BP is well controlled 

## 2011-03-19 NOTE — Progress Notes (Signed)
Subjective:    Patient ID: Connor Miller, male    DOB: 02/01/1923, 78 y.o.   MRN: [id number]  Thyroid Problem Presents for follow-up visit. Patient reports no anxiety, cold intolerance, constipation, depressed mood, diaphoresis, diarrhea, dry skin, fatigue, hair loss, heat intolerance, hoarse voice, leg swelling, nail problem, palpitations, tremors, visual change, weight gain or weight loss. The symptoms have been stable.  Hypertension This is a chronic problem. The current episode started more than 1 year ago. The problem has been gradually improving since onset. The problem is controlled. Pertinent negatives include no anxiety, blurred vision, chest pain, headaches, malaise/fatigue, neck pain, orthopnea, palpitations, peripheral edema, PND, shortness of breath or sweats. There are no associated agents to hypertension. Past treatments include diuretics. The current treatment provides significant improvement. There are no compliance problems.  Hypertensive end-organ damage includes a thyroid problem.      Review of Systems  Constitutional: Negative for fever, chills, weight loss, weight gain, malaise/fatigue, diaphoresis, activity change, appetite change, fatigue and unexpected weight change.  HENT: Negative.  Negative for hoarse voice and neck pain.   Eyes: Negative.  Negative for blurred vision.  Respiratory: Negative for apnea, cough, choking, chest tightness, shortness of breath, wheezing and stridor.   Cardiovascular: Negative for chest pain, palpitations, orthopnea, leg swelling and PND.  Gastrointestinal: Negative for nausea, vomiting, abdominal pain, diarrhea, constipation, blood in stool, abdominal distention, anal bleeding and rectal pain.  Genitourinary: Negative for dysuria, urgency, frequency, hematuria, flank pain, decreased urine volume, enuresis, difficulty urinating and genital sores.  Musculoskeletal: Negative.   Skin: Negative for color change, pallor, rash and wound.    Neurological: Negative for dizziness, tremors, seizures, syncope, facial asymmetry, speech difficulty, weakness, light-headedness, numbness and headaches.  Hematological: Negative for cold intolerance, heat intolerance and adenopathy. Does not bruise/bleed easily.  Psychiatric/Behavioral: Negative.        Objective:   Physical Exam  Vitals reviewed. Constitutional: He is oriented to person, place, and time. He appears well-developed and well-nourished. No distress.  HENT:  Mouth/Throat: Oropharynx is clear and moist. No oropharyngeal exudate.  Eyes: Conjunctivae are normal. Right eye exhibits no discharge. Left eye exhibits no discharge. No scleral icterus.  Neck: Normal range of motion. Neck supple. No JVD present. No tracheal deviation present. No thyromegaly present.  Cardiovascular: Normal rate, regular rhythm, normal heart sounds and intact distal pulses.  Exam reveals no gallop and no friction rub.   No murmur heard. Pulmonary/Chest: Effort normal and breath sounds normal. No stridor. No respiratory distress. He has no wheezes. He has no rales. He exhibits no tenderness.  Abdominal: Soft. Bowel sounds are normal. He exhibits no distension. There is no tenderness. There is no rebound and no guarding.  Musculoskeletal: Normal range of motion. He exhibits no edema and no tenderness.  Lymphadenopathy:    He has no cervical adenopathy.  Neurological: He is oriented to person, place, and time. He displays normal reflexes. He exhibits normal muscle tone. Coordination normal.  Skin: Skin is warm and dry. No rash noted. He is not diaphoretic. No erythema. No pallor.  Psychiatric: He has a normal mood and affect. His behavior is normal. Judgment and thought content normal.     Lab Results  Component Value Date   WBC 4.7 11/13/2010   HGB 12.6* 11/13/2010   HCT 36.7* 11/13/2010   PLT 202.0 11/13/2010   GLUCOSE 74 11/13/2010   ALT 17 11/13/2010   AST 19 11/13/2010   NA 137 11/13/2010   K 4.3 11/13/2010  CL 100 11/13/2010   CREATININE 1.1 11/13/2010   BUN 19 11/13/2010   CO2 33* 11/13/2010   TSH 1.35 11/13/2010       Assessment & Plan:

## 2011-03-19 NOTE — Assessment & Plan Note (Signed)
Continue B12 replacement. ?

## 2011-04-15 ENCOUNTER — Telehealth: Payer: Self-pay

## 2011-04-15 NOTE — Telephone Encounter (Signed)
Patient called [facility] 04/14/11 requesting to reschedule b12 appt from 11/9 to 04/21/11. Appt r/s, returned call to patient//no answer or VM.

## 2011-04-18 ENCOUNTER — Ambulatory Visit: Payer: MEDICARE

## 2011-04-18 DIAGNOSIS — D518 Other vitamin B12 deficiency anemias: Secondary | ICD-10-CM

## 2011-04-18 MED ORDER — CYANOCOBALAMIN 1000 MCG/ML IJ SOLN
1000.0000 ug | Freq: Once | INTRAMUSCULAR | Status: AC
  Administered 2011-04-18: 1000 ug via INTRAMUSCULAR

## 2011-04-21 ENCOUNTER — Other Ambulatory Visit: Payer: Self-pay | Admitting: Dermatology

## 2011-04-21 ENCOUNTER — Ambulatory Visit: Payer: MEDICARE

## 2011-05-07 ENCOUNTER — Encounter: Payer: Self-pay | Admitting: Internal Medicine

## 2011-05-07 ENCOUNTER — Ambulatory Visit (INDEPENDENT_AMBULATORY_CARE_PROVIDER_SITE_OTHER): Payer: MEDICARE | Admitting: Internal Medicine

## 2011-05-07 ENCOUNTER — Other Ambulatory Visit (INDEPENDENT_AMBULATORY_CARE_PROVIDER_SITE_OTHER): Payer: MEDICARE

## 2011-05-07 VITALS — BP 122/70 | HR 58 | Temp 98.3°F | Resp 16 | Wt 135.8 lb

## 2011-05-07 DIAGNOSIS — E538 Deficiency of other specified B group vitamins: Secondary | ICD-10-CM

## 2011-05-07 DIAGNOSIS — D518 Other vitamin B12 deficiency anemias: Secondary | ICD-10-CM

## 2011-05-07 DIAGNOSIS — D649 Anemia, unspecified: Secondary | ICD-10-CM

## 2011-05-07 DIAGNOSIS — I10 Essential (primary) hypertension: Secondary | ICD-10-CM

## 2011-05-07 DIAGNOSIS — N400 Enlarged prostate without lower urinary tract symptoms: Secondary | ICD-10-CM

## 2011-05-07 DIAGNOSIS — E039 Hypothyroidism, unspecified: Secondary | ICD-10-CM

## 2011-05-07 DIAGNOSIS — R634 Abnormal weight loss: Secondary | ICD-10-CM

## 2011-05-07 HISTORY — DX: Benign prostatic hyperplasia without lower urinary tract symptoms: N40.0

## 2011-05-07 LAB — COMPREHENSIVE METABOLIC PANEL
ALT: 15 U/L (ref 0–53)
AST: 19 U/L (ref 0–37)
BUN: 25 mg/dL — ABNORMAL HIGH (ref 6–23)
CO2: 32 mEq/L (ref 19–32)
Creatinine, Ser: 1 mg/dL (ref 0.4–1.5)
GFR: 74.39 mL/min (ref >60.00)
Total Bilirubin: 0.8 mg/dL (ref 0.3–1.2)

## 2011-05-07 LAB — CBC WITH DIFFERENTIAL/PLATELET
Basophils Absolute: 0 10*3/uL (ref 0.0–0.1)
HCT: 36.9 % — ABNORMAL LOW (ref 39.0–52.0)
Lymphs Abs: 1.7 10*3/uL (ref 0.7–4.0)
MCHC: 33.5 g/dL (ref 30.0–36.0)
MCV: 104.6 fl — ABNORMAL HIGH (ref 78.0–100.0)
Monocytes Absolute: 0.5 10*3/uL (ref 0.1–1.0)
Platelets: 200 10*3/uL (ref 150.0–400.0)
RDW: 14.5 % (ref 11.5–14.6)

## 2011-05-07 LAB — URINALYSIS, ROUTINE W REFLEX MICROSCOPIC
Nitrite: NEGATIVE
Specific Gravity, Urine: 1.025 (ref 1.000–1.030)
Urine Glucose: NEGATIVE
Urobilinogen, UA: 0.2 (ref 0.0–1.0)

## 2011-05-07 LAB — PSA: PSA: 2.32 ng/mL (ref 0.10–4.00)

## 2011-05-07 LAB — HEMOGLOBIN A1C: Hgb A1c MFr Bld: 5.5 % (ref 4.6–6.5)

## 2011-05-07 MED ORDER — CYANOCOBALAMIN 1000 MCG/ML IJ SOLN
1000.0000 ug | Freq: Once | INTRAMUSCULAR | Status: AC
  Administered 2011-05-07: 1000 ug via INTRAMUSCULAR

## 2011-05-07 NOTE — Assessment & Plan Note (Signed)
I will check his CBC and vitamin B12 level

## 2011-05-07 NOTE — Progress Notes (Signed)
Subjective:    Patient ID: Connor Miller, male    DOB: January 31, 1923, 77 y.o.   MRN: [id number]  HPI He returns for f/up and he tells me that he has lost about 35 pounds over the last 3 years and he tells me that his prior physician was not able to find anything wrong with him that caused the weight loss. He does not have any complaints today to tell me about other than the weight loss.   Review of Systems  Constitutional: Positive for unexpected weight change. Negative for fever, chills, diaphoresis, activity change, appetite change and fatigue.  HENT: Negative.   Eyes: Negative.   Respiratory: Negative for cough, choking, chest tightness, shortness of breath, wheezing and stridor.   Cardiovascular: Negative for chest pain, palpitations and leg swelling.  Gastrointestinal: Negative for nausea, vomiting, abdominal pain, diarrhea, constipation, blood in stool, abdominal distention and anal bleeding.  Genitourinary: Negative for dysuria, urgency, frequency, hematuria, flank pain, decreased urine volume, discharge, penile swelling, scrotal swelling, enuresis, difficulty urinating, genital sores, penile pain and testicular pain.  Musculoskeletal: Negative for myalgias, back pain, joint swelling, arthralgias and gait problem.  Skin: Negative for color change, pallor, rash and wound.  Neurological: Negative for dizziness, tremors, seizures, syncope, facial asymmetry, speech difficulty, weakness, light-headedness, numbness and headaches.  Hematological: Negative for adenopathy. Does not bruise/bleed easily.  Psychiatric/Behavioral: Negative.        Objective:   Physical Exam  Vitals reviewed. Constitutional: He is oriented to person, place, and time. He appears well-developed and well-nourished. No distress.  HENT:  Head: Normocephalic and atraumatic.  Mouth/Throat: Oropharynx is clear and moist. No oropharyngeal exudate.  Eyes: Conjunctivae are normal. Right eye exhibits no discharge. Left eye  exhibits no discharge. No scleral icterus.  Neck: Normal range of motion. Neck supple. No JVD present. No tracheal deviation present. No thyromegaly present.  Cardiovascular: Normal rate, regular rhythm, normal heart sounds and intact distal pulses.  Exam reveals no gallop and no friction rub.   No murmur heard. Pulmonary/Chest: Effort normal and breath sounds normal. No stridor. No respiratory distress. He has no wheezes. He has no rales. He exhibits no tenderness.  Abdominal: Soft. Bowel sounds are normal. He exhibits no distension and no mass. There is no tenderness. There is no rebound and no guarding. Hernia confirmed negative in the right inguinal area and confirmed negative in the left inguinal area.  Genitourinary: Rectum normal, testes normal and penis normal. Rectal exam shows no external hemorrhoid, no internal hemorrhoid, no fissure, no mass, no tenderness and anal tone normal. Guaiac negative stool. Prostate is enlarged. Prostate is not tender. Right testis shows no mass, no swelling and no tenderness. Right testis is descended. Left testis shows no mass, no swelling and no tenderness. Left testis is descended. No penile erythema or penile tenderness. No discharge found.  Musculoskeletal: Normal range of motion. He exhibits no edema and no tenderness.  Lymphadenopathy:    He has no cervical adenopathy.       Right: No inguinal adenopathy present.       Left: No inguinal adenopathy present.  Neurological: He is oriented to person, place, and time.  Skin: Skin is warm and dry. No rash noted. He is not diaphoretic. No erythema. No pallor.  Psychiatric: He has a normal mood and affect. His behavior is normal. Judgment and thought content normal.     Lab Results  Component Value Date   WBC 4.7 11/13/2010   HGB 12.6* 11/13/2010  HCT 36.7* 11/13/2010   PLT 202.0 11/13/2010   GLUCOSE 74 11/13/2010   ALT 17 11/13/2010   AST 19 11/13/2010   NA 137 11/13/2010   K 4.3 11/13/2010   CL 100 11/13/2010    CREATININE 1.1 11/13/2010   BUN 19 11/13/2010   CO2 33* 11/13/2010   TSH 1.35 11/13/2010       Assessment & Plan:

## 2011-05-07 NOTE — Assessment & Plan Note (Signed)
This appears to be stable 

## 2011-05-07 NOTE — Progress Notes (Signed)
Addended by: Rock Nephew T on: 05/07/2011 02:13 PM   Modules accepted: Orders

## 2011-05-07 NOTE — Assessment & Plan Note (Signed)
I will check his PSA and UA

## 2011-05-07 NOTE — Assessment & Plan Note (Signed)
The weight loss sounds like it is part of advanced age, I do not see anything today to explain it. I will check labs to look for secondary causes.

## 2011-05-07 NOTE — Patient Instructions (Signed)

## 2011-05-07 NOTE — Assessment & Plan Note (Signed)
His BP is well controlled, I will check his lytes and renal function 

## 2011-05-13 ENCOUNTER — Ambulatory Visit: Payer: MEDICARE

## 2011-05-28 ENCOUNTER — Encounter: Payer: Self-pay | Admitting: Internal Medicine

## 2011-05-28 ENCOUNTER — Ambulatory Visit (INDEPENDENT_AMBULATORY_CARE_PROVIDER_SITE_OTHER)
Admission: RE | Admit: 2011-05-28 | Discharge: 2011-05-28 | Disposition: A | Discharge status: Home / Self Care | Payer: MEDICARE | Source: Ambulatory Visit | Attending: Internal Medicine | Admitting: Internal Medicine

## 2011-05-28 ENCOUNTER — Other Ambulatory Visit: Payer: Self-pay | Admitting: *Deleted

## 2011-05-28 ENCOUNTER — Ambulatory Visit (INDEPENDENT_AMBULATORY_CARE_PROVIDER_SITE_OTHER): Payer: MEDICARE | Admitting: Internal Medicine

## 2011-05-28 DIAGNOSIS — I10 Essential (primary) hypertension: Secondary | ICD-10-CM

## 2011-05-28 DIAGNOSIS — J189 Pneumonia, unspecified organism: Secondary | ICD-10-CM

## 2011-05-28 DIAGNOSIS — E039 Hypothyroidism, unspecified: Secondary | ICD-10-CM

## 2011-05-28 DIAGNOSIS — D649 Anemia, unspecified: Secondary | ICD-10-CM

## 2011-05-28 DIAGNOSIS — R634 Abnormal weight loss: Secondary | ICD-10-CM

## 2011-05-28 DIAGNOSIS — R05 Cough: Secondary | ICD-10-CM

## 2011-05-28 DIAGNOSIS — D518 Other vitamin B12 deficiency anemias: Secondary | ICD-10-CM

## 2011-05-28 HISTORY — DX: Pneumonia, unspecified organism: J18.9

## 2011-05-28 MED ORDER — MOXIFLOXACIN HCL 400 MG PO TABS: 400.0000 mg | ORAL_TABLET | Freq: Every day | ORAL | Status: DC

## 2011-05-28 MED ORDER — MOXIFLOXACIN HCL 400 MG PO TABS: 400.0000 mg | ORAL_TABLET | Freq: Every day | ORAL | Status: AC

## 2011-05-28 MED ORDER — GUAIFENESIN-CODEINE 100-10 MG/5ML PO SYRP: 5.0000 mL | ORAL_SOLUTION | Freq: Three times a day (TID) | ORAL | Status: AC | PRN

## 2011-05-28 NOTE — Assessment & Plan Note (Signed)
Recent tsh was normal

## 2011-05-28 NOTE — Patient Instructions (Signed)

## 2011-05-28 NOTE — Assessment & Plan Note (Signed)
He will start taking avelox for the infection and a cough suppressant

## 2011-05-28 NOTE — Assessment & Plan Note (Signed)
His BP is well controlled 

## 2011-05-28 NOTE — Progress Notes (Signed)
Subjective:    Patient ID: Connor Miller, male    DOB: [birth year], 77 y.o.   MRN: [id number]  Cough This is a new problem. Episode onset: for 2 weeks. The problem has been gradually worsening. The problem occurs every few hours. The cough is non-productive. Associated symptoms include chills, rhinorrhea and shortness of breath. Pertinent negatives include no chest pain, ear congestion, ear pain, fever, headaches, heartburn, hemoptysis, myalgias, nasal congestion, postnasal drip, rash, sore throat, sweats, weight loss or wheezing. The symptoms are aggravated by nothing. He has tried OTC cough suppressant for the symptoms. The treatment provided no relief.      Review of Systems  Constitutional: Positive for chills. Negative for fever, weight loss, diaphoresis, activity change, appetite change, fatigue and unexpected weight change.  HENT: Positive for rhinorrhea. Negative for ear pain, sore throat, facial swelling, sneezing, trouble swallowing, neck pain, neck stiffness, voice change, postnasal drip and sinus pressure.   Respiratory: Positive for cough and shortness of breath. Negative for hemoptysis, choking, chest tightness, wheezing and stridor.   Cardiovascular: Negative for chest pain, palpitations and leg swelling.  Gastrointestinal: Negative for heartburn, nausea, vomiting, abdominal pain, diarrhea, constipation, blood in stool and abdominal distention.  Genitourinary: Negative.   Musculoskeletal: Negative for myalgias, back pain, joint swelling, arthralgias and gait problem.  Skin: Negative for color change, pallor, rash and wound.  Neurological: Negative for dizziness, tremors, seizures, syncope, facial asymmetry, speech difficulty, weakness, light-headedness, numbness and headaches.  Hematological: Negative for adenopathy. Does not bruise/bleed easily.  Psychiatric/Behavioral: Negative.        Objective:   Physical Exam  Vitals reviewed. Constitutional: He is oriented to person,  place, and time. He appears well-developed and well-nourished. No distress.  HENT:  Head: No trismus in the jaw.  Right Ear: Hearing, tympanic membrane, external ear and ear canal normal.  Left Ear: Hearing, tympanic membrane, external ear and ear canal normal.  Nose: Nose normal. No mucosal edema, rhinorrhea, nose lacerations, sinus tenderness, nasal deformity, septal deviation or nasal septal hematoma. No epistaxis.  No foreign bodies. Right sinus exhibits no maxillary sinus tenderness and no frontal sinus tenderness. Left sinus exhibits no maxillary sinus tenderness and no frontal sinus tenderness.  Mouth/Throat: Oropharynx is clear and moist and mucous membranes are normal. Mucous membranes are not pale, not dry and not cyanotic. No uvula swelling. No oropharyngeal exudate, posterior oropharyngeal edema, posterior oropharyngeal erythema or tonsillar abscesses.  Eyes: Conjunctivae are normal. Right eye exhibits no discharge. Left eye exhibits no discharge. No scleral icterus.  Neck: Normal range of motion. Neck supple. No JVD present. No tracheal deviation present. No thyromegaly present.  Cardiovascular: Normal rate, regular rhythm, normal heart sounds and intact distal pulses.  Exam reveals no gallop and no friction rub.   No murmur heard. Pulmonary/Chest: Effort normal and breath sounds normal. No stridor. No respiratory distress. He has no wheezes. He has no rales. He exhibits no tenderness.  Abdominal: Soft. Bowel sounds are normal. He exhibits no distension and no mass. There is no tenderness. There is no rebound and no guarding.  Musculoskeletal: Normal range of motion. He exhibits no edema and no tenderness.  Lymphadenopathy:    He has no cervical adenopathy.  Neurological: He is oriented to person, place, and time.  Skin: Skin is warm and dry. No rash noted. He is not diaphoretic. No erythema. No pallor.  Psychiatric: He has a normal mood and affect. His behavior is normal. Judgment and  thought content normal.  Lab Results  Component Value Date   WBC 4.6 05/07/2011   HGB 12.3* 05/07/2011   HCT 36.9* 05/07/2011   PLT 200.0 05/07/2011   GLUCOSE 63* 05/07/2011   ALT 15 05/07/2011   AST 19 05/07/2011   NA 144 05/07/2011   K 4.3 05/07/2011   CL 104 05/07/2011   CREATININE 1.0 05/07/2011   BUN 25* 05/07/2011   CO2 32 05/07/2011   TSH 1.24 05/07/2011   PSA 2.32 05/07/2011   HGBA1C 5.5 05/07/2011      Assessment & Plan:

## 2011-05-28 NOTE — Assessment & Plan Note (Signed)
This is not pathological

## 2011-05-28 NOTE — Assessment & Plan Note (Signed)
His CXR is + for a new infiltrate c/w pna

## 2011-06-11 ENCOUNTER — Telehealth: Payer: Self-pay | Admitting: *Deleted

## 2011-06-11 DIAGNOSIS — K521 Toxic gastroenteritis and colitis: Secondary | ICD-10-CM

## 2011-06-11 DIAGNOSIS — T887XXA Unspecified adverse effect of drug or medicament, initial encounter: Secondary | ICD-10-CM

## 2011-06-11 NOTE — Telephone Encounter (Signed)
Patient informed. IFob & Align samples ready for p/u. Order for stool c-diff entered.

## 2011-06-11 NOTE — Telephone Encounter (Signed)
Pt as given Avelox which at first caused constipation, but has now turned to diarrhea-Pt c/o "can't leave the house now" and is requesting recommendation as to what he can take for this issue.?

## 2011-06-11 NOTE — Telephone Encounter (Signed)
Imodium  AD 2 qid prn Align 1 a day x 2 wks Stool for c.diff OV any MD this wk Thx

## 2011-06-12 ENCOUNTER — Ambulatory Visit: Payer: MEDICARE

## 2011-06-17 ENCOUNTER — Ambulatory Visit (INDEPENDENT_AMBULATORY_CARE_PROVIDER_SITE_OTHER): Payer: MEDICARE | Admitting: Internal Medicine

## 2011-06-17 ENCOUNTER — Ambulatory Visit (INDEPENDENT_AMBULATORY_CARE_PROVIDER_SITE_OTHER)
Admission: RE | Admit: 2011-06-17 | Discharge: 2011-06-17 | Disposition: A | Discharge status: Home / Self Care | Payer: MEDICARE | Source: Ambulatory Visit | Attending: Internal Medicine | Admitting: Internal Medicine

## 2011-06-17 ENCOUNTER — Encounter: Payer: Self-pay | Admitting: Internal Medicine

## 2011-06-17 DIAGNOSIS — K5900 Constipation, unspecified: Secondary | ICD-10-CM

## 2011-06-17 DIAGNOSIS — I10 Essential (primary) hypertension: Secondary | ICD-10-CM

## 2011-06-17 DIAGNOSIS — J189 Pneumonia, unspecified organism: Secondary | ICD-10-CM

## 2011-06-17 DIAGNOSIS — D649 Anemia, unspecified: Secondary | ICD-10-CM

## 2011-06-17 MED ORDER — POLYETHYLENE GLYCOL 3350 17 GM/SCOOP PO POWD: 17.0000 g | Freq: Two times a day (BID) | ORAL | Status: AC | PRN

## 2011-06-17 NOTE — Patient Instructions (Signed)

## 2011-06-18 DIAGNOSIS — K5900 Constipation, unspecified: Secondary | ICD-10-CM | POA: Insufficient documentation

## 2011-06-18 HISTORY — DX: Constipation, unspecified: K59.00

## 2011-06-18 NOTE — Assessment & Plan Note (Signed)
This is stable 

## 2011-06-18 NOTE — Assessment & Plan Note (Signed)
I will try him on miralax to hopefully get more regular and consistent BM's

## 2011-06-18 NOTE — Assessment & Plan Note (Signed)
BP is well controlled 

## 2011-06-18 NOTE — Assessment & Plan Note (Signed)
His CXR shows significant improvement today, his symptoms have resolved

## 2011-06-18 NOTE — Progress Notes (Signed)
  Subjective:    Patient ID: Connor Miller, male    DOB: [birth year], 78 y.o.   MRN: [id number]  HPI He returns c/o constipation for several weeks, he feels like the antibiotics he took for PNA caused it. He did a laxative 2 weeks ago and had VERY good results, in fact he tells me that he had diarrhea for 2 days after the laxative and so he started immodium-ad and now he feels constipated again.  Review of Systems  Constitutional: Negative for fever, chills, diaphoresis, activity change, appetite change, fatigue and unexpected weight change.  HENT: Negative for sore throat, facial swelling, trouble swallowing, neck pain, neck stiffness and voice change.   Eyes: Negative.   Respiratory: Negative for cough, chest tightness, shortness of breath, wheezing and stridor.   Cardiovascular: Negative for chest pain, palpitations and leg swelling.  Gastrointestinal: Positive for constipation. Negative for nausea, vomiting, abdominal pain, diarrhea, blood in stool, abdominal distention, anal bleeding and rectal pain.  Genitourinary: Negative.   Musculoskeletal: Negative.   Skin: Negative.   Neurological: Negative for dizziness, tremors, seizures, syncope, facial asymmetry, speech difficulty, weakness, light-headedness, numbness and headaches.  Hematological: Negative for adenopathy. Does not bruise/bleed easily.  Psychiatric/Behavioral: Negative.        Objective:   Physical Exam  Vitals reviewed. Constitutional: He is oriented to person, place, and time. He appears well-developed and well-nourished. No distress.  HENT:  Head: Normocephalic and atraumatic.  Mouth/Throat: Oropharynx is clear and moist. No oropharyngeal exudate.  Eyes: Conjunctivae are normal. Right eye exhibits no discharge. Left eye exhibits no discharge. No scleral icterus.  Neck: Normal range of motion. Neck supple. No JVD present. No tracheal deviation present. No thyromegaly present.  Cardiovascular: Normal rate, regular  rhythm, normal heart sounds and intact distal pulses.  Exam reveals no gallop and no friction rub.   No murmur heard. Pulmonary/Chest: Effort normal and breath sounds normal. No stridor. No respiratory distress. He has no wheezes. He has no rales. He exhibits no tenderness.  Abdominal: Soft. Bowel sounds are normal. He exhibits no distension and no mass. There is no tenderness. There is no rebound and no guarding.  Musculoskeletal: Normal range of motion. He exhibits no edema and no tenderness.  Lymphadenopathy:    He has no cervical adenopathy.  Neurological: He is oriented to person, place, and time.  Skin: Skin is warm and dry. No rash noted. He is not diaphoretic. No erythema. No pallor.  Psychiatric: He has a normal mood and affect. His behavior is normal. Judgment and thought content normal.     Lab Results  Component Value Date   WBC 4.6 05/07/2011   HGB 12.3* 05/07/2011   HCT 36.9* 05/07/2011   PLT 200.0 05/07/2011   GLUCOSE 63* 05/07/2011   ALT 15 05/07/2011   AST 19 05/07/2011   NA 144 05/07/2011   K 4.3 05/07/2011   CL 104 05/07/2011   CREATININE 1.0 05/07/2011   BUN 25* 05/07/2011   CO2 32 05/07/2011   TSH 1.24 05/07/2011   PSA 2.32 05/07/2011   HGBA1C 5.5 05/07/2011       Assessment & Plan:

## 2011-06-26 ENCOUNTER — Other Ambulatory Visit: Payer: MEDICARE

## 2011-06-26 DIAGNOSIS — Z1211 Encounter for screening for malignant neoplasm of colon: Secondary | ICD-10-CM

## 2011-06-26 LAB — HEMOCCULT SLIDES (X 3 CARDS)
Fecal Occult Blood: NEGATIVE
OCCULT 2: NEGATIVE
OCCULT 3: NEGATIVE
OCCULT 4: NEGATIVE

## 2011-07-09 ENCOUNTER — Emergency Department (HOSPITAL_COMMUNITY): Payer: MEDICARE

## 2011-07-09 ENCOUNTER — Emergency Department (HOSPITAL_COMMUNITY)
Admission: EM | Admit: 2011-07-09 | Discharge: 2011-07-09 | Disposition: A | Discharge status: Home / Self Care | Payer: MEDICARE | Attending: Emergency Medicine | Admitting: Emergency Medicine

## 2011-07-09 ENCOUNTER — Other Ambulatory Visit: Payer: Self-pay

## 2011-07-09 ENCOUNTER — Encounter (HOSPITAL_COMMUNITY): Payer: Self-pay | Admitting: Emergency Medicine

## 2011-07-09 DIAGNOSIS — W010XXA Fall on same level from slipping, tripping and stumbling without subsequent striking against object, initial encounter: Secondary | ICD-10-CM | POA: Insufficient documentation

## 2011-07-09 DIAGNOSIS — S0180XA Unspecified open wound of other part of head, initial encounter: Secondary | ICD-10-CM | POA: Insufficient documentation

## 2011-07-09 DIAGNOSIS — H5789 Other specified disorders of eye and adnexa: Secondary | ICD-10-CM | POA: Insufficient documentation

## 2011-07-09 DIAGNOSIS — I10 Essential (primary) hypertension: Secondary | ICD-10-CM | POA: Insufficient documentation

## 2011-07-09 DIAGNOSIS — M25519 Pain in unspecified shoulder: Secondary | ICD-10-CM | POA: Insufficient documentation

## 2011-07-09 DIAGNOSIS — S0003XA Contusion of scalp, initial encounter: Secondary | ICD-10-CM | POA: Insufficient documentation

## 2011-07-09 DIAGNOSIS — IMO0002 Reserved for concepts with insufficient information to code with codable children: Secondary | ICD-10-CM

## 2011-07-09 DIAGNOSIS — W19XXXA Unspecified fall, initial encounter: Secondary | ICD-10-CM

## 2011-07-09 DIAGNOSIS — R079 Chest pain, unspecified: Secondary | ICD-10-CM | POA: Insufficient documentation

## 2011-07-09 DIAGNOSIS — E039 Hypothyroidism, unspecified: Secondary | ICD-10-CM | POA: Insufficient documentation

## 2011-07-09 DIAGNOSIS — S1093XA Contusion of unspecified part of neck, initial encounter: Secondary | ICD-10-CM | POA: Insufficient documentation

## 2011-07-09 DIAGNOSIS — T148XXA Other injury of unspecified body region, initial encounter: Secondary | ICD-10-CM | POA: Insufficient documentation

## 2011-07-09 DIAGNOSIS — I6789 Other cerebrovascular disease: Secondary | ICD-10-CM | POA: Insufficient documentation

## 2011-07-09 DIAGNOSIS — M25529 Pain in unspecified elbow: Secondary | ICD-10-CM | POA: Insufficient documentation

## 2011-07-09 LAB — CBC
HCT: 33.9 % — ABNORMAL LOW (ref 39.0–52.0)
MCHC: 33.6 g/dL (ref 30.0–36.0)
MCV: 99.1 fL (ref 78.0–100.0)
Platelets: 162 10*3/uL (ref 150–400)
RDW: 14.1 % (ref 11.5–15.5)
WBC: 7.3 10*3/uL (ref 4.0–10.5)

## 2011-07-09 LAB — POCT I-STAT TROPONIN I: Troponin i, poc: 0.14 ng/mL (ref 0.00–0.08)

## 2011-07-09 LAB — POCT I-STAT, CHEM 8
Calcium, Ion: 0.99 mmol/L — ABNORMAL LOW (ref 1.12–1.32)
Chloride: 104 mEq/L (ref 96–112)
Glucose, Bld: 106 mg/dL — ABNORMAL HIGH (ref 70–99)
HCT: 37 % — ABNORMAL LOW (ref 39.0–52.0)
Hemoglobin: 12.6 g/dL — ABNORMAL LOW (ref 13.0–17.0)

## 2011-07-09 LAB — CARDIAC PANEL(CRET KIN+CKTOT+MB+TROPI): Relative Index: INVALID (ref 0.0–2.5)

## 2011-07-09 MED ORDER — LIDOCAINE HCL 1 % IJ SOLN
INTRAMUSCULAR | Status: AC
  Filled 2011-07-09: qty 20

## 2011-07-09 MED ORDER — LIDOCAINE HCL (PF) 1 % IJ SOLN
5.0000 mL | Freq: Once | INTRAMUSCULAR | Status: DC
  Filled 2011-07-09: qty 5

## 2011-07-09 MED ORDER — TETANUS-DIPHTH-ACELL PERTUSSIS 5-2.5-18.5 LF-MCG/0.5 IM SUSP
0.5000 mL | Freq: Once | INTRAMUSCULAR | Status: AC
  Administered 2011-07-09: 0.5 mL via INTRAMUSCULAR
  Filled 2011-07-09: qty 0.5

## 2011-07-09 MED ORDER — OXYCODONE-ACETAMINOPHEN 5-325 MG PO TABS
1.0000 | ORAL_TABLET | Freq: Once | ORAL | Status: AC
  Administered 2011-07-09: 1 via ORAL
  Filled 2011-07-09: qty 1

## 2011-07-09 MED ORDER — CEPHALEXIN 500 MG PO CAPS: 500.0000 mg | ORAL_CAPSULE | Freq: Three times a day (TID) | ORAL | Status: AC

## 2011-07-09 NOTE — ED Notes (Signed)
Was trimmimg branches with a long pole yesterday, lost balance, fell onto concrete driveway. Left side of face bruised, nose swollen/bruised with small lac to bridge of nose. Laceration to forehead between eyebrows- open, not bleeding, approximately 1". Denies LOC

## 2011-07-09 NOTE — ED Notes (Signed)
Patient taken to radiology.will obtain labs upon patient's return

## 2011-07-09 NOTE — ED Notes (Signed)
Attempted IV start x 3. IV tx called.

## 2011-07-09 NOTE — ED Notes (Signed)
Ambulated in hallway with minimal assistance.

## 2011-07-09 NOTE — ED Provider Notes (Signed)
History     CSN: [id number]  Arrival date & time 07/09/11  1017   First MD Initiated Contact with Patient 07/09/11 1032      Chief Complaint  Patient presents with  . fall- facial lac   . Chest Pain    sternum pain -- hurts with movement    (Consider location/radiation/quality/duration/timing/severity/associated sxs/prior treatment) HPI Comments: Patient with hx of anemia, hyperthyroidism, & HTN presents to the emergency deartment with chief complaint of fall.  He states that he was cutting branches yesterday afternoon around 4 p.m. with a heavy pool, then noticed he felt dizzy, miss stepped and fell face forward.  Impact of fall was on his left shoulder, elbow, and nose & forehead.  Patient denies LOC, change in speech or vision, unilateral weakness, memory or cognition impairment, nausea, vomiting or HA. Pt is not on blood thinners, but reports taking 2 Tylenol last night to treat pain. Pt reports having sternal chest pain that is worsened with movement and deep breathing.Pt states he has been unable to ambulate d/t sternal pain.   The pain does not radiate and is rated at an 8/10. He also reports pain of his nose face and left arm. Pt denies SOB, DOE, edema, or cardiac history.   Patient is a 78 y.o. male presenting with chest pain. The history is provided by the patient.  Chest Pain Primary symptoms include dizziness (prior to fall, not current). Pertinent negatives for primary symptoms include no fever, no shortness of breath, no cough, no wheezing, no palpitations, no abdominal pain, no nausea and no vomiting.  Dizziness does not occur with tinnitus, nausea, vomiting, weakness or diaphoresis.  Pertinent negatives for associated symptoms include no diaphoresis, no numbness and no weakness.  Pertinent negatives for past medical history include no seizures.     Past Medical History  Diagnosis Date  . Anemia     NOS  . Hypertension   . Hypothyroidism     History reviewed. No  pertinent past surgical history.  Family History  Problem Relation Age of Onset  . Lung cancer Other   . Cancer Other     lung    History  Substance Use Topics  . Smoking status: Never Smoker   . Smokeless tobacco: Not on file  . Alcohol Use: 0.0 oz/week     occasionally,       Review of Systems  Constitutional: Negative for fever, chills, diaphoresis and appetite change.  HENT: Negative for hearing loss, ear pain, trouble swallowing, neck pain, neck stiffness and tinnitus.   Eyes: Negative for photophobia, pain and visual disturbance.  Respiratory: Negative for cough, shortness of breath, wheezing and stridor.   Cardiovascular: Positive for chest pain. Negative for palpitations.  Gastrointestinal: Negative for nausea, vomiting and abdominal pain.  Genitourinary: Negative for dysuria, urgency and difficulty urinating.  Musculoskeletal: Negative for myalgias and back pain.  Skin: Positive for wound. Negative for color change, pallor and rash.  Neurological: Positive for dizziness (prior to fall, not current). Negative for seizures, syncope, facial asymmetry, speech difficulty, weakness, light-headedness, numbness and headaches.  Hematological: Does not bruise/bleed easily.  Psychiatric/Behavioral: Negative for behavioral problems, confusion, decreased concentration and agitation.  All other systems reviewed and are negative.    Allergies  Review of patient's allergies indicates no known allergies.  Home Medications   Current Outpatient Rx  Name Route Sig Dispense Refill  . ACETAMINOPHEN 325 MG PO TABS Oral Take 650 mg by mouth every 6 (six) hours as needed.  For pain.    . DORZOLAMIDE HCL-TIMOLOL MAL 22.3-6.8 MG/ML OP SOLN Left Eye Place 1 drop into the left eye daily.    Marland Kitchen LEVOTHYROXINE SODIUM 75 MCG PO TABS Oral Take 1 tablet (75 mcg total) by mouth daily. 90 tablet 3    BRAND MEDICALLY NECESSARY  . ALIGN PO Oral Take 1 capsule by mouth daily.     . TRIAMTERENE-HCTZ  37.5-25 MG PO CAPS Oral Take 1 capsule by mouth every other day.      BP 131/63  Pulse 64  Temp(Src) 97.7 F (36.5 C) (Oral)  Resp 16  SpO2 99%  Physical Exam  Nursing note and vitals reviewed. Constitutional: He is oriented to person, place, and time. Vital signs are normal. He appears well-developed and well-nourished. No distress.  HENT:  Head: Normocephalic. Head is with abrasion, with contusion and with laceration.    Nose: Nose lacerations and sinus tenderness present. No septal deviation or nasal septal hematoma.       1.5 cm laceration located between eyes, not currently bleeding. Contusions and tenderness to palpation under left eye, no entrapment with EOMs. No battle's sign or mastoid tenderness. Abrasion located on nose bridge.   Eyes: Conjunctivae and EOM are normal. Pupils are equal, round, and reactive to light. No scleral icterus.  Neck: Normal range of motion and full passive range of motion without pain. Neck supple. No JVD present. Carotid bruit is not present. No rigidity. No Brudzinski's sign noted.       No spinous process tenderness, FROM with out pain.   Cardiovascular: Normal rate, regular rhythm, normal heart sounds, intact distal pulses and normal pulses.   Pulmonary/Chest: Effort normal and breath sounds normal. No respiratory distress. He has no wheezes. He has no rales. He exhibits tenderness and bony tenderness. He exhibits no crepitus, no deformity and no swelling.    Abdominal: Soft. Normal appearance, normal aorta and bowel sounds are normal. There is no tenderness.  Musculoskeletal:       Left shoulder: He exhibits decreased range of motion, tenderness, bony tenderness and pain. He exhibits no swelling, no effusion and no laceration.       Left elbow: He exhibits normal range of motion and no swelling. tenderness found.       Arms:      No hip instability. FROM active & passive of hips. No bony tenderness of lower extremities. Pain with both active and  passive ROM of left upper extremity w bony tenderness of olecranon process and humerus head.   Lymphadenopathy:    He has no cervical adenopathy.  Neurological: He is alert and oriented to person, place, and time. He has normal strength. No cranial nerve deficit or sensory deficit. He displays a negative Romberg sign. Coordination and gait normal. GCS eye subscore is 4. GCS verbal subscore is 5. GCS motor subscore is 6.       A&O x3.  Able to follow commands. PERRL, EOMs, no vertical or bidirectional nystagmus. Shoulder shrug, facial muscles, tongue protrusion and swallow intact.  Motor strength 5/5 bilaterally including grip strength, triceps, hamstrings and ankle dorsiflexion.  Normal patellar DTRs.  Light touch intact in all 4 distal limbs.  Intact finger to nose, shin to heel and rapid alternating movements. No ataxia or dysequilibrium.   Skin: Skin is warm and dry. Abrasion, bruising and laceration noted. No rash noted. He is not diaphoretic.       See HEENT  Psychiatric: He has a normal mood and affect.  His behavior is normal.    ED Course  Procedures (including critical care time)  Labs Reviewed  CBC - Abnormal; Notable for the following:    RBC 3.42 (*)    Hemoglobin 11.4 (*)    HCT 33.9 (*)    All other components within normal limits  POCT I-STAT, CHEM 8 - Abnormal; Notable for the following:    Glucose, Bld 106 (*)    Calcium, Ion 0.99 (*)    Hemoglobin 12.6 (*)    HCT 37.0 (*)    All other components within normal limits  POCT I-STAT TROPONIN I - Abnormal; Notable for the following:    Troponin i, poc 0.14 (*)    All other components within normal limits  CARDIAC PANEL(CRET KIN+CKTOT+MB+TROPI)   Dg Chest 2 View  07/09/2011  *RADIOLOGY REPORT*  Clinical Data: Anterior chest pain.  Patient fell on concrete.  CHEST - 2 VIEW  Comparison: Chest radiograph 06/17/2011 and 05/28/2011.  Findings: Heart, mediastinal, and hilar contours are within normal limits.  There are  emphysematous changes.  Linear scar atelectasis in the lingula.  Negative for pneumothorax or pleural effusion.  In the lateral projection, no displaced sternal fracture is identified.  Thoracic spine vertebral bodies are decreased in mineralization, but normal in height.  A right cervical rib is noted.  IMPRESSION: 1.  No acute cardiopulmonary disease or evidence of trauma to the chest. 2.  Probable emphysema.  Original Report Authenticated By: Britta Mccreedy, M.D.   Dg Elbow Complete Left  07/09/2011  *RADIOLOGY REPORT*  Clinical Data: Fall.  Pain.  LEFT ELBOW - COMPLETE 3+ VIEW  Comparison: None  Findings: No acute bony abnormality.  Specifically, no fracture, subluxation, or dislocation.  Soft tissues are intact.  IMPRESSION: No acute bony abnormality.  Original Report Authenticated By: Cyndie Chime, M.D.   Ct Head Wo Contrast  07/09/2011  *RADIOLOGY REPORT*  Clinical Data:  Fall, bruising left orbit.  CT HEAD WITHOUT CONTRAST CT MAXILLOFACIAL WITHOUT CONTRAST  Technique:  Multidetector CT imaging of the head and maxillofacial structures were performed using the standard protocol without intravenous contrast. Multiplanar CT image reconstructions of the maxillofacial structures were also generated.  Comparison:  None.  CT HEAD  Findings: There is atrophy and chronic small vessel disease changes. No acute intracranial abnormality.  Specifically, no hemorrhage, hydrocephalus, mass lesion, acute infarction, or significant intracranial injury.  No acute calvarial abnormality. Visualized paranasal sinuses and mastoids clear.  Orbital soft tissues unremarkable.  IMPRESSION: No acute intracranial abnormality.  Atrophy, chronic microvascular disease.  CT MAXILLOFACIAL  Findings:   Soft tissue swelling in the left supraorbital region. Small amount of soft tissue gas, presumably from laceration. Orbital walls are intact.  Paranasal sinuses are clear.  Zygomatic arches intact.  Globes and intraorbital soft tissues  unremarkable.  IMPRESSION: No evidence of facial fracture.  Original Report Authenticated By: Cyndie Chime, M.D.   Dg Shoulder Left  07/09/2011  *RADIOLOGY REPORT*  Clinical Data: Fall, left arm and shoulder pain.  LEFT SHOULDER - 2+ VIEW  Comparison: None.  Findings: Mild degenerative changes in the left shoulder. No acute bony abnormality.  Specifically, no fracture, subluxation, or dislocation.  Soft tissues are intact.  IMPRESSION: No acute bony abnormality.  Original Report Authenticated By: Cyndie Chime, M.D.   Ct Maxillofacial Wo Cm  07/09/2011  *RADIOLOGY REPORT*  Clinical Data:  Fall, bruising left orbit.  CT HEAD WITHOUT CONTRAST CT MAXILLOFACIAL WITHOUT CONTRAST  Technique:  Multidetector CT imaging of  the head and maxillofacial structures were performed using the standard protocol without intravenous contrast. Multiplanar CT image reconstructions of the maxillofacial structures were also generated.  Comparison:  None.  CT HEAD  Findings: There is atrophy and chronic small vessel disease changes. No acute intracranial abnormality.  Specifically, no hemorrhage, hydrocephalus, mass lesion, acute infarction, or significant intracranial injury.  No acute calvarial abnormality. Visualized paranasal sinuses and mastoids clear.  Orbital soft tissues unremarkable.  IMPRESSION: No acute intracranial abnormality.  Atrophy, chronic microvascular disease.  CT MAXILLOFACIAL  Findings:   Soft tissue swelling in the left supraorbital region. Small amount of soft tissue gas, presumably from laceration. Orbital walls are intact.  Paranasal sinuses are clear.  Zygomatic arches intact.  Globes and intraorbital soft tissues unremarkable.  IMPRESSION: No evidence of facial fracture.  Original Report Authenticated By: Cyndie Chime, M.D.     No diagnosis found.   Date: 07/09/2011  Rate: 64  Rhythm: normal sinus rhythm  QRS Axis: normal  Intervals: normal  ST/T Wave abnormalities: normal  Conduction  Disutrbances:none  Narrative Interpretation:   Old EKG Reviewed: No significant changes noted  LACERATION REPAIR Performed by: Jaci Carrel Authorized by: Jaci Carrel Consent: Verbal consent obtained. Risks and benefits: risks, benefits and alternatives were discussed Consent given by: patient Patient identity confirmed: provided demographic data Prepped and Draped in normal sterile fashion Wound explored  Laceration Location: Forehead, between eyes  Laceration Length: 1.5cm  No Foreign Bodies seen or palpated  Anesthesia: local infiltration  Local anesthetic: lidocaine 1% with epinephrine  Anesthetic total: 1 ml  Irrigation method: syringe Amount of cleaning: extensive, w NS & AF sterile spray  Skin closure: Prolene 6.0  Number of sutures: 2  Technique: simple interrupted   Patient tolerance: Patient tolerated the procedure well with no immediate complications.      MDM  Fall, laceration  Patient chest pain likely due to contusion and less likely diagnosis of cardiac etiology do to negative cardiac enzymes and normal EKG.  No evidence of fractures, dislocations or acute hemorrhage  on radiology. Patient's pain was managed in the emergency department with Percocet.  Patient has been ambulated in the hallway and denies dizziness.  The patient was given a tetanus shot and his laceration was repaired with instructions to return to the ED for removal of sutures in 5 days.  Discussed reasons to return to the emergency department including severe headaches, double vision, and vomiting.  Patient verbalizes understanding.  Patient is hemodynamically stable and in no acute distress prior to discharge.  Patient will be sent home with antibiotic Keflex as prophylactic treatment for facial laceration.  This patient has been seen with Dr. Leo Rod who agrees with the above plan.      Jaci Carrel, [place] 07/09/11 1405

## 2011-07-10 NOTE — ED Provider Notes (Signed)
Medical screening examination/treatment/procedure(s) were conducted as a shared visit with non-physician practitioner(s) and myself.  I personally evaluated the patient during the encounter S/p mechanical fall. No faintness or dizziness. No preceeding symptoms. Contusion/tenderness ant chest wall and face/nose. Spine non tender. Xrays/   Suzi Roots, MD 07/10/11 [id number]

## 2011-07-15 ENCOUNTER — Encounter: Payer: Self-pay | Admitting: Internal Medicine

## 2011-07-15 ENCOUNTER — Ambulatory Visit (INDEPENDENT_AMBULATORY_CARE_PROVIDER_SITE_OTHER): Payer: MEDICARE | Admitting: Internal Medicine

## 2011-07-15 VITALS — BP 118/84 | HR 73 | Temp 97.6°F | Resp 14 | Wt 137.4 lb

## 2011-07-15 DIAGNOSIS — R071 Chest pain on breathing: Secondary | ICD-10-CM

## 2011-07-15 DIAGNOSIS — Z5189 Encounter for other specified aftercare: Secondary | ICD-10-CM

## 2011-07-15 DIAGNOSIS — S0003XA Contusion of scalp, initial encounter: Secondary | ICD-10-CM

## 2011-07-15 DIAGNOSIS — D518 Other vitamin B12 deficiency anemias: Secondary | ICD-10-CM

## 2011-07-15 DIAGNOSIS — S0083XA Contusion of other part of head, initial encounter: Secondary | ICD-10-CM

## 2011-07-15 DIAGNOSIS — R0789 Other chest pain: Secondary | ICD-10-CM

## 2011-07-15 MED ORDER — CYANOCOBALAMIN 1000 MCG/ML IJ SOLN
1000.0000 ug | Freq: Once | INTRAMUSCULAR | Status: AC
  Administered 2011-07-15: 1000 ug via INTRAMUSCULAR

## 2011-07-20 ENCOUNTER — Encounter: Payer: Self-pay | Admitting: Internal Medicine

## 2011-07-20 DIAGNOSIS — S0083XA Contusion of other part of head, initial encounter: Secondary | ICD-10-CM | POA: Insufficient documentation

## 2011-07-20 DIAGNOSIS — Z5189 Encounter for other specified aftercare: Secondary | ICD-10-CM | POA: Insufficient documentation

## 2011-07-20 DIAGNOSIS — R0789 Other chest pain: Secondary | ICD-10-CM | POA: Insufficient documentation

## 2011-07-20 NOTE — Patient Instructions (Signed)
Facial and Scalp Contusions You have a contusion (bruise) on your face or scalp. Injuries around the face and head generally cause a lot of swelling, especially around the eyes. This is because the blood supply to this area is good and tissues are loose. Swelling from a contusion is usually better in 2-3 days. It may take a week or longer for a "black eye" to clear up completely. HOME CARE INSTRUCTIONS   Apply ice packs to the injured area for about 15 to 20 minutes, 3 to 4 times a day, for the first couple days. This helps keep swelling down.   Use mild pain medicine as needed or instructed by your caregiver.   You may have a mild headache, slight dizziness, nausea, and weakness for a few days. This usually clears up with bed rest and mild pain medications.   Contact your caregiver if you are concerned about facial defects or have any difficulty with your bite or develop pain with chewing.  SEEK IMMEDIATE MEDICAL CARE IF:  You develop severe pain or a headache, unrelieved by medication.   You develop unusual sleepiness, confusion, personality changes, or vomiting.   You have a persistent nosebleed, double or blurred vision, or drainage from the nose or ear.   You have difficulty walking or using your arms or legs.  MAKE SURE YOU:   Understand these instructions.   Will watch your condition.   Will get help right away if you are not doing well or get worse.  Document Released: 07/03/2004 Document Revised: 02/05/2011 Document Reviewed: 05/26/2005 [employer] Patient Information 2012 [employer]. 

## 2011-07-20 NOTE — Assessment & Plan Note (Signed)
This is healing well.

## 2011-07-20 NOTE — Progress Notes (Signed)
Subjective:    Patient ID: Connor Miller, male    DOB: 1922/11/06, 78 y.o.   MRN: [id number]  Suture / Staple Removal The sutures were placed 3 to 6 days ago. He tried antibiotic ointment use since the wound repair. The treatment provided significant relief. There has been no drainage from the wound. There is no redness present. The swelling has improved. The pain has no pain. He has no difficulty moving the affected extremity or digit.  He fell 6 days ago and was seen in the ER, see attached xray results (all normal), and returns today for a suture removal over his forehead. He has no headache, facial pain, or neuro symptoms. He reports mild soreness over his upper chest and he feels like that is much better    Review of Systems  Constitutional: Negative for activity change and appetite change.  HENT: Positive for facial swelling. Negative for hearing loss, nosebleeds, congestion, sore throat, rhinorrhea, sneezing, drooling, mouth sores, trouble swallowing, neck pain, neck stiffness, dental problem, voice change, postnasal drip, sinus pressure, tinnitus and ear discharge.   Eyes: Negative for photophobia, pain, discharge, redness, itching and visual disturbance.  Respiratory: Negative for apnea, cough, choking, chest tightness, shortness of breath, wheezing and stridor.   Cardiovascular: Positive for chest pain. Negative for palpitations and leg swelling.  Genitourinary: Negative.   Musculoskeletal: Positive for arthralgias. Negative for myalgias, back pain, joint swelling and gait problem.  Skin: Positive for wound. Negative for color change, pallor and rash.  Neurological: Negative for dizziness, tremors, seizures, syncope, facial asymmetry, speech difficulty, weakness, light-headedness, numbness and headaches.  Hematological: Negative for adenopathy. Does not bruise/bleed easily.  Psychiatric/Behavioral: Negative.        Objective:   Physical Exam  Vitals reviewed. Constitutional: He  is oriented to person, place, and time. He appears well-developed and well-nourished.  Non-toxic appearance. He does not have a sickly appearance. He does not appear ill. No distress.  HENT:  Head: Head is with raccoon's eyes, with abrasion, with contusion and with laceration. Head is without Battle's sign. Hair is normal. No trismus in the jaw.    Right Ear: Hearing, tympanic membrane, external ear and ear canal normal.  Left Ear: Hearing, tympanic membrane, external ear and ear canal normal.  Nose: No mucosal edema, rhinorrhea, sinus tenderness, nasal deformity, septal deviation or nasal septal hematoma. No epistaxis.  No foreign bodies.  Mouth/Throat: Oropharynx is clear and moist and mucous membranes are normal. Mucous membranes are not pale, not dry and not cyanotic. No uvula swelling. No oropharyngeal exudate, posterior oropharyngeal edema, posterior oropharyngeal erythema or tonsillar abscesses.       He has scattered abrasions/lacerations across his forehead and nose and all appear to be healing well, two sutures are removed from one, there is no exudate/warmth/ttp, he does have mild bilateral racoon eyes  Eyes: EOM are normal. Pupils are equal, round, and reactive to light. Right eye exhibits no discharge. Left eye exhibits no discharge. No scleral icterus.  Neck: Normal range of motion. Neck supple.  Cardiovascular: Normal rate, regular rhythm, normal heart sounds and intact distal pulses.  Exam reveals no gallop and no friction rub.   No murmur heard. Pulmonary/Chest: Effort normal and breath sounds normal. Not tachypneic. No respiratory distress. He has no decreased breath sounds. He has no wheezes. He has no rhonchi. He has no rales. Chest wall is not dull to percussion. He exhibits no mass, no tenderness, no bony tenderness, no laceration, no crepitus, no edema,  no deformity, no swelling and no retraction.  Abdominal: Soft. Bowel sounds are normal. He exhibits no distension and no mass.  There is no tenderness. There is no rebound and no guarding.  Musculoskeletal: Normal range of motion. He exhibits no edema and no tenderness.  Neurological: He is alert and oriented to person, place, and time. He has normal reflexes. He displays normal reflexes. No cranial nerve deficit. He exhibits normal muscle tone. Coordination normal.  Skin: Skin is warm and dry. No rash noted. He is not diaphoretic. No erythema. No pallor.  Psychiatric: He has a normal mood and affect. His behavior is normal. Judgment and thought content normal.      Dg Chest 2 View  07/09/2011  *RADIOLOGY REPORT*  Clinical Data: Anterior chest pain.  Patient fell on concrete.  CHEST - 2 VIEW  Comparison: Chest radiograph 06/17/2011 and 05/28/2011.  Findings: Heart, mediastinal, and hilar contours are within normal limits.  There are emphysematous changes.  Linear scar atelectasis in the lingula.  Negative for pneumothorax or pleural effusion.  In the lateral projection, no displaced sternal fracture is identified.  Thoracic spine vertebral bodies are decreased in mineralization, but normal in height.  A right cervical rib is noted.  IMPRESSION: 1.  No acute cardiopulmonary disease or evidence of trauma to the chest. 2.  Probable emphysema.  Original Report Authenticated By: Britta Mccreedy, M.D.   Dg Elbow Complete Left  07/09/2011  *RADIOLOGY REPORT*  Clinical Data: Fall.  Pain.  LEFT ELBOW - COMPLETE 3+ VIEW  Comparison: None  Findings: No acute bony abnormality.  Specifically, no fracture, subluxation, or dislocation.  Soft tissues are intact.  IMPRESSION: No acute bony abnormality.  Original Report Authenticated By: Cyndie Chime, M.D.   Ct Head Wo Contrast  07/09/2011  *RADIOLOGY REPORT*  Clinical Data:  Fall, bruising left orbit.  CT HEAD WITHOUT CONTRAST CT MAXILLOFACIAL WITHOUT CONTRAST  Technique:  Multidetector CT imaging of the head and maxillofacial structures were performed using the standard protocol without  intravenous contrast. Multiplanar CT image reconstructions of the maxillofacial structures were also generated.  Comparison:  None.  CT HEAD  Findings: There is atrophy and chronic small vessel disease changes. No acute intracranial abnormality.  Specifically, no hemorrhage, hydrocephalus, mass lesion, acute infarction, or significant intracranial injury.  No acute calvarial abnormality. Visualized paranasal sinuses and mastoids clear.  Orbital soft tissues unremarkable.  IMPRESSION: No acute intracranial abnormality.  Atrophy, chronic microvascular disease.  CT MAXILLOFACIAL  Findings:   Soft tissue swelling in the left supraorbital region. Small amount of soft tissue gas, presumably from laceration. Orbital walls are intact.  Paranasal sinuses are clear.  Zygomatic arches intact.  Globes and intraorbital soft tissues unremarkable.  IMPRESSION: No evidence of facial fracture.  Original Report Authenticated By: Cyndie Chime, M.D.   Dg Shoulder Left  07/09/2011  *RADIOLOGY REPORT*  Clinical Data: Fall, left arm and shoulder pain.  LEFT SHOULDER - 2+ VIEW  Comparison: None.  Findings: Mild degenerative changes in the left shoulder. No acute bony abnormality.  Specifically, no fracture, subluxation, or dislocation.  Soft tissues are intact.  IMPRESSION: No acute bony abnormality.  Original Report Authenticated By: Cyndie Chime, M.D.   Ct Maxillofacial Wo Cm  07/09/2011  *RADIOLOGY REPORT*  Clinical Data:  Fall, bruising left orbit.  CT HEAD WITHOUT CONTRAST CT MAXILLOFACIAL WITHOUT CONTRAST  Technique:  Multidetector CT imaging of the head and maxillofacial structures were performed using the standard protocol without intravenous contrast. Multiplanar CT  image reconstructions of the maxillofacial structures were also generated.  Comparison:  None.  CT HEAD  Findings: There is atrophy and chronic small vessel disease changes. No acute intracranial abnormality.  Specifically, no hemorrhage, hydrocephalus, mass  lesion, acute infarction, or significant intracranial injury.  No acute calvarial abnormality. Visualized paranasal sinuses and mastoids clear.  Orbital soft tissues unremarkable.  IMPRESSION: No acute intracranial abnormality.  Atrophy, chronic microvascular disease.  CT MAXILLOFACIAL  Findings:   Soft tissue swelling in the left supraorbital region. Small amount of soft tissue gas, presumably from laceration. Orbital walls are intact.  Paranasal sinuses are clear.  Zygomatic arches intact.  Globes and intraorbital soft tissues unremarkable.  IMPRESSION: No evidence of facial fracture.  Original Report Authenticated By: Cyndie Chime, M.D.     Assessment & Plan:

## 2011-07-20 NOTE — Assessment & Plan Note (Signed)
Sutures removed, all wounds appear to be healing well with no s/s of infection

## 2011-07-20 NOTE — Assessment & Plan Note (Signed)
He appears to be progressing without any complications

## 2011-08-12 ENCOUNTER — Ambulatory Visit: Payer: MEDICARE

## 2011-08-15 ENCOUNTER — Ambulatory Visit (INDEPENDENT_AMBULATORY_CARE_PROVIDER_SITE_OTHER): Payer: MEDICARE | Admitting: *Deleted

## 2011-08-15 DIAGNOSIS — D518 Other vitamin B12 deficiency anemias: Secondary | ICD-10-CM

## 2011-08-15 MED ORDER — CYANOCOBALAMIN 1000 MCG/ML IJ SOLN
1000.0000 ug | Freq: Once | INTRAMUSCULAR | Status: AC
  Administered 2011-08-15: 1000 ug via INTRAMUSCULAR

## 2011-09-18 ENCOUNTER — Other Ambulatory Visit (INDEPENDENT_AMBULATORY_CARE_PROVIDER_SITE_OTHER): Payer: Medicare Other

## 2011-09-18 ENCOUNTER — Ambulatory Visit (INDEPENDENT_AMBULATORY_CARE_PROVIDER_SITE_OTHER): Payer: Medicare Other | Admitting: Internal Medicine

## 2011-09-18 ENCOUNTER — Encounter: Payer: Self-pay | Admitting: Internal Medicine

## 2011-09-18 VITALS — BP 108/78 | HR 77 | Temp 97.5°F | Resp 14 | Wt 138.5 lb

## 2011-09-18 DIAGNOSIS — I10 Essential (primary) hypertension: Secondary | ICD-10-CM

## 2011-09-18 DIAGNOSIS — D649 Anemia, unspecified: Secondary | ICD-10-CM

## 2011-09-18 DIAGNOSIS — D518 Other vitamin B12 deficiency anemias: Secondary | ICD-10-CM

## 2011-09-18 DIAGNOSIS — E039 Hypothyroidism, unspecified: Secondary | ICD-10-CM

## 2011-09-18 LAB — COMPREHENSIVE METABOLIC PANEL
BUN: 20 mg/dL (ref 6–23)
CO2: 32 mEq/L (ref 19–32)
Creatinine, Ser: 1.1 mg/dL (ref 0.4–1.5)
GFR: 65.97 mL/min (ref >60.00)
Glucose, Bld: 84 mg/dL (ref 70–99)
Sodium: 138 mEq/L (ref 135–145)
Total Bilirubin: 0.6 mg/dL (ref 0.3–1.2)
Total Protein: 6.4 g/dL (ref 6.0–8.3)

## 2011-09-18 LAB — CBC WITH DIFFERENTIAL/PLATELET
Basophils Relative: 0.4 % (ref 0.0–3.0)
Eosinophils Relative: 1.2 % (ref 0.0–5.0)
HCT: 37 % — ABNORMAL LOW (ref 39.0–52.0)
Hemoglobin: 12.5 g/dL — ABNORMAL LOW (ref 13.0–17.0)
Lymphs Abs: 1.5 10*3/uL (ref 0.7–4.0)
MCV: 104.1 fl — ABNORMAL HIGH (ref 78.0–100.0)
Monocytes Absolute: 0.6 10*3/uL (ref 0.1–1.0)
Monocytes Relative: 13.2 % — ABNORMAL HIGH (ref 3.0–12.0)
Neutro Abs: 2.4 10*3/uL (ref 1.4–7.7)
RBC: 3.56 Mil/uL — ABNORMAL LOW (ref 4.22–5.81)
WBC: 4.6 10*3/uL (ref 4.5–10.5)

## 2011-09-18 MED ORDER — CYANOCOBALAMIN 1000 MCG/ML IJ SOLN
1000.0000 ug | Freq: Once | INTRAMUSCULAR | Status: AC
  Administered 2011-09-18: 1000 ug via INTRAMUSCULAR

## 2011-09-18 NOTE — Progress Notes (Signed)
  Subjective:    Patient ID: Connor Miller, male    DOB: 1923/02/16, 78 y.o.   MRN: [id number]  Hypertension This is a chronic problem. The current episode started more than 1 year ago. The problem has been gradually improving since onset. The problem is controlled. Pertinent negatives include no anxiety, blurred vision, chest pain, headaches, malaise/fatigue, neck pain, orthopnea, palpitations, peripheral edema, PND, shortness of breath or sweats. Past treatments include diuretics. The current treatment provides significant improvement. There are no compliance problems.  Hypertensive end-organ damage includes a thyroid problem.  Thyroid Problem Presents for follow-up visit. Patient reports no anxiety, cold intolerance, constipation, depressed mood, diaphoresis, diarrhea, dry skin, fatigue, hair loss, heat intolerance, hoarse voice, leg swelling, menstrual problem, nail problem, palpitations, tremors, visual change, weight gain or weight loss. The symptoms have been stable.      Review of Systems  Constitutional: Negative.  Negative for weight loss, weight gain, malaise/fatigue, diaphoresis and fatigue.  HENT: Negative.  Negative for hoarse voice and neck pain.   Eyes: Negative.  Negative for blurred vision.  Respiratory: Negative.  Negative for shortness of breath.   Cardiovascular: Negative.  Negative for chest pain, palpitations, orthopnea and PND.  Gastrointestinal: Negative.  Negative for diarrhea and constipation.  Genitourinary: Negative.  Negative for menstrual problem.  Musculoskeletal: Negative.   Skin: Negative.   Neurological: Negative.  Negative for tremors and headaches.  Hematological: Negative.  Negative for cold intolerance and heat intolerance.  Psychiatric/Behavioral: Negative.        Objective:   Physical Exam  Vitals reviewed. Constitutional: He is oriented to person, place, and time. He appears well-developed and well-nourished. No distress.  HENT:  Head:  Normocephalic and atraumatic.  Mouth/Throat: Oropharynx is clear and moist. No oropharyngeal exudate.  Eyes: Conjunctivae are normal. Left eye exhibits no discharge. No scleral icterus.  Neck: Normal range of motion. Neck supple. No JVD present. No tracheal deviation present. No thyromegaly present.  Cardiovascular: Normal rate, normal heart sounds and intact distal pulses.  Exam reveals no gallop and no friction rub.   No murmur heard. Pulmonary/Chest: Effort normal and breath sounds normal. No stridor. No respiratory distress. He has no wheezes. He has no rales. He exhibits no tenderness.  Abdominal: Soft. Bowel sounds are normal. He exhibits no distension and no mass. There is no tenderness. There is no rebound and no guarding.  Musculoskeletal: Normal range of motion. He exhibits no edema and no tenderness.  Lymphadenopathy:    He has no cervical adenopathy.  Neurological: He is oriented to person, place, and time.  Skin: Skin is warm and dry. No rash noted. He is not diaphoretic. No erythema. No pallor.  Psychiatric: He has a normal mood and affect. His behavior is normal. Judgment and thought content normal.     Lab Results  Component Value Date   WBC 7.3 07/09/2011   HGB 12.6* 07/09/2011   HCT 37.0* 07/09/2011   PLT 162 07/09/2011   GLUCOSE 106* 07/09/2011   ALT 15 05/07/2011   AST 19 05/07/2011   NA 136 07/09/2011   K 3.9 07/09/2011   CL 104 07/09/2011   CREATININE 1.20 07/09/2011   BUN 18 07/09/2011   CO2 32 05/07/2011   TSH 1.24 05/07/2011   PSA 2.32 05/07/2011   HGBA1C 5.5 05/07/2011       Assessment & Plan:

## 2011-09-18 NOTE — Assessment & Plan Note (Signed)
I will check his TSH level today and will change his dose if needed

## 2011-09-18 NOTE — Assessment & Plan Note (Signed)
His BP is well controlled, I will check his lytes and renal function today 

## 2011-09-18 NOTE — Progress Notes (Signed)
Addended by: Regis Bill on: 09/18/2011 04:53 PM   Modules accepted: Orders

## 2011-09-18 NOTE — Patient Instructions (Signed)

## 2011-09-18 NOTE — Assessment & Plan Note (Signed)
Recheck the CBC today

## 2011-09-23 ENCOUNTER — Other Ambulatory Visit: Payer: Self-pay | Admitting: Dermatology

## 2011-10-14 ENCOUNTER — Ambulatory Visit (INDEPENDENT_AMBULATORY_CARE_PROVIDER_SITE_OTHER): Payer: MEDICARE

## 2011-10-14 DIAGNOSIS — D518 Other vitamin B12 deficiency anemias: Secondary | ICD-10-CM

## 2011-10-14 MED ORDER — METHYLPREDNISOLONE ACETATE 80 MG/ML IJ SUSP: 120.0000 mg | Freq: Once | INTRAMUSCULAR | Status: DC

## 2011-10-14 MED ORDER — CYANOCOBALAMIN 1000 MCG/ML IJ SOLN
1000.0000 ug | Freq: Once | INTRAMUSCULAR | Status: AC
  Administered 2011-10-14: 1000 ug via INTRAMUSCULAR

## 2011-10-21 ENCOUNTER — Telehealth: Payer: Self-pay | Admitting: Internal Medicine

## 2011-11-20 ENCOUNTER — Encounter: Payer: Self-pay | Admitting: Internal Medicine

## 2011-11-20 ENCOUNTER — Other Ambulatory Visit (INDEPENDENT_AMBULATORY_CARE_PROVIDER_SITE_OTHER): Payer: MEDICARE

## 2011-11-20 ENCOUNTER — Ambulatory Visit (INDEPENDENT_AMBULATORY_CARE_PROVIDER_SITE_OTHER): Payer: MEDICARE | Admitting: Internal Medicine

## 2011-11-20 VITALS — BP 120/78 | HR 76 | Temp 97.7°F | Resp 16 | Wt 133.0 lb

## 2011-11-20 DIAGNOSIS — E039 Hypothyroidism, unspecified: Secondary | ICD-10-CM

## 2011-11-20 DIAGNOSIS — I10 Essential (primary) hypertension: Secondary | ICD-10-CM

## 2011-11-20 LAB — LIPID PANEL
HDL: 68.5 mg/dL (ref >39.00)
LDL Cholesterol: 75 mg/dL (ref 0–99)
Total CHOL/HDL Ratio: 2
VLDL: 9 mg/dL (ref 0.0–40.0)

## 2011-11-20 LAB — TSH: TSH: 0.92 u[IU]/mL (ref 0.35–5.50)

## 2011-11-20 LAB — COMPREHENSIVE METABOLIC PANEL
ALT: 17 U/L (ref 0–53)
AST: 17 U/L (ref 0–37)
Albumin: 3.7 g/dL (ref 3.5–5.2)
Alkaline Phosphatase: 86 U/L (ref 39–117)
Glucose, Bld: 86 mg/dL (ref 70–99)
Potassium: 4.6 mEq/L (ref 3.5–5.1)
Sodium: 140 mEq/L (ref 135–145)
Total Bilirubin: 0.7 mg/dL (ref 0.3–1.2)
Total Protein: 6.2 g/dL (ref 6.0–8.3)

## 2011-11-20 NOTE — Progress Notes (Signed)
  Subjective:    Patient ID: Connor Miller, male    DOB: February 01, 1923, 78 y.o.   MRN: [id number]  Thyroid Problem Presents for follow-up visit. Symptoms include fatigue. Patient reports no anxiety, cold intolerance, constipation, depressed mood, diaphoresis, diarrhea, dry skin, hair loss, heat intolerance, hoarse voice, leg swelling, nail problem, palpitations, tremors, visual change, weight gain or weight loss. The symptoms have been stable.      Review of Systems  Constitutional: Positive for fatigue. Negative for fever, chills, weight loss, weight gain, diaphoresis, activity change, appetite change and unexpected weight change.  HENT: Negative.  Negative for hoarse voice.   Eyes: Negative.   Respiratory: Negative for apnea, cough, choking, shortness of breath, wheezing and stridor.   Cardiovascular: Negative for chest pain, palpitations and leg swelling.  Gastrointestinal: Negative for nausea, vomiting, diarrhea, constipation, blood in stool and anal bleeding.  Genitourinary: Negative for dysuria, urgency, frequency, hematuria, flank pain, decreased urine volume, enuresis and difficulty urinating.  Musculoskeletal: Negative for myalgias, back pain, joint swelling, arthralgias and gait problem.  Skin: Negative for color change, pallor, rash and wound.  Neurological: Negative.  Negative for tremors.  Hematological: Negative for cold intolerance, heat intolerance and adenopathy. Does not bruise/bleed easily.  Psychiatric/Behavioral: Negative.        Objective:   Physical Exam  Vitals reviewed. Constitutional: He is oriented to person, place, and time. He appears well-developed and well-nourished. No distress.  HENT:  Head: Normocephalic and atraumatic.  Mouth/Throat: Oropharynx is clear and moist. No oropharyngeal exudate.  Eyes: Conjunctivae are normal. Right eye exhibits no discharge. Left eye exhibits no discharge. No scleral icterus.  Neck: Normal range of motion. Neck supple. No  JVD present. No tracheal deviation present. No thyromegaly present.  Cardiovascular: Normal rate, regular rhythm, normal heart sounds and intact distal pulses.  Exam reveals no gallop and no friction rub.   No murmur heard. Pulmonary/Chest: Effort normal and breath sounds normal. No stridor. No respiratory distress. He has no wheezes. He has no rales. He exhibits no tenderness.  Abdominal: Soft. Bowel sounds are normal. He exhibits no distension and no mass. There is no tenderness. There is no rebound and no guarding.  Musculoskeletal: Normal range of motion. He exhibits no tenderness.  Lymphadenopathy:    He has no cervical adenopathy.  Neurological: He is oriented to person, place, and time.  Skin: Skin is warm and dry. No rash noted. He is not diaphoretic. No erythema. No pallor.  Psychiatric: He has a normal mood and affect. His behavior is normal. Judgment and thought content normal.      Lab Results  Component Value Date   WBC 4.6 09/18/2011   HGB 12.5* 09/18/2011   HCT 37.0* 09/18/2011   PLT 213.0 09/18/2011   GLUCOSE 84 09/18/2011   ALT 13 09/18/2011   AST 19 09/18/2011   NA 138 09/18/2011   K 4.1 09/18/2011   CL 99 09/18/2011   CREATININE 1.1 09/18/2011   BUN 20 09/18/2011   CO2 32 09/18/2011   TSH 1.64 09/18/2011   PSA 2.32 05/07/2011   HGBA1C 5.5 05/07/2011      Assessment & Plan:

## 2011-11-20 NOTE — Patient Instructions (Signed)

## 2011-11-20 NOTE — Assessment & Plan Note (Signed)
His BP is well controlled, I will check his lytes and renal function 

## 2011-11-20 NOTE — Assessment & Plan Note (Signed)
I will check his TSH today 

## 2011-12-15 ENCOUNTER — Ambulatory Visit (INDEPENDENT_AMBULATORY_CARE_PROVIDER_SITE_OTHER): Payer: MEDICARE

## 2011-12-15 DIAGNOSIS — D649 Anemia, unspecified: Secondary | ICD-10-CM

## 2011-12-15 MED ORDER — CYANOCOBALAMIN 1000 MCG/ML IJ SOLN
1000.0000 ug | Freq: Once | INTRAMUSCULAR | Status: AC
  Administered 2011-12-15: 1000 ug via INTRAMUSCULAR

## 2012-01-15 ENCOUNTER — Ambulatory Visit: Payer: MEDICARE

## 2012-01-21 ENCOUNTER — Ambulatory Visit: Payer: MEDICARE

## 2012-02-10 ENCOUNTER — Ambulatory Visit: Payer: MEDICARE

## 2012-02-11 ENCOUNTER — Emergency Department (HOSPITAL_COMMUNITY): Payer: MEDICARE

## 2012-02-11 ENCOUNTER — Encounter (HOSPITAL_COMMUNITY): Payer: Self-pay | Admitting: *Deleted

## 2012-02-11 ENCOUNTER — Emergency Department (HOSPITAL_COMMUNITY)
Admission: EM | Admit: 2012-02-11 | Discharge: 2012-02-11 | Disposition: A | Discharge status: Home / Self Care | Payer: MEDICARE | Attending: Emergency Medicine | Admitting: Emergency Medicine

## 2012-02-11 DIAGNOSIS — S40029A Contusion of unspecified upper arm, initial encounter: Secondary | ICD-10-CM

## 2012-02-11 DIAGNOSIS — S7000XA Contusion of unspecified hip, initial encounter: Secondary | ICD-10-CM | POA: Insufficient documentation

## 2012-02-11 DIAGNOSIS — S40019A Contusion of unspecified shoulder, initial encounter: Secondary | ICD-10-CM | POA: Insufficient documentation

## 2012-02-11 DIAGNOSIS — E039 Hypothyroidism, unspecified: Secondary | ICD-10-CM | POA: Insufficient documentation

## 2012-02-11 DIAGNOSIS — W1789XA Other fall from one level to another, initial encounter: Secondary | ICD-10-CM | POA: Insufficient documentation

## 2012-02-11 DIAGNOSIS — I10 Essential (primary) hypertension: Secondary | ICD-10-CM | POA: Insufficient documentation

## 2012-02-11 MED ORDER — TRAMADOL-ACETAMINOPHEN 37.5-325 MG PO TABS: 1.0000 | ORAL_TABLET | Freq: Four times a day (QID) | ORAL | Status: AC | PRN

## 2012-02-11 NOTE — ED Notes (Signed)
Pt ambulated to the BR with a cane 

## 2012-02-11 NOTE — ED Provider Notes (Signed)
History     CSN: [id number]  Arrival date & time 02/11/12  1420   First MD Initiated Contact with Patient 02/11/12 1546      Chief Complaint  Patient presents with  . Fall    (Consider location/radiation/quality/duration/timing/severity/associated sxs/prior treatment) HPI Comments: Connor Miller is a 78 y.o. Male  who fell several days ago, mechanical fall, when he tripped while doing work on his deck. Height of the fall was about 6 inches. He was able to get up and work after that. He has had persistent pain in the right shoulder and right hip. He is able to cannulate using a cane. He denies head, neck, or back injury. He is using Celebrex with mild relief. The discomfort is worst when walking.  Patient is a 78 y.o. male presenting with fall. The history is provided by the patient and the spouse.  Fall    Past Medical History  Diagnosis Date  . Anemia     NOS  . Hypertension   . Hypothyroidism     History reviewed. No pertinent past surgical history.  Family History  Problem Relation Age of Onset  . Lung cancer Other   . Cancer Other     lung    History  Substance Use Topics  . Smoking status: Never Smoker   . Smokeless tobacco: Not on file  . Alcohol Use: 0.0 oz/week     occasionally,       Review of Systems  All other systems reviewed and are negative.    Allergies  Review of patient's allergies indicates no known allergies.  Home Medications   Current Outpatient Rx  Name Route Sig Dispense Refill  . DORZOLAMIDE HCL-TIMOLOL MAL 22.3-6.8 MG/ML OP SOLN Left Eye Place 1 drop into the left eye 2 (two) times daily.     Marland Kitchen LEVOTHYROXINE SODIUM 75 MCG PO TABS Oral Take 1 tablet (75 mcg total) by mouth daily. 90 tablet 3    BRAND MEDICALLY NECESSARY  . TRIAMTERENE-HCTZ 37.5-25 MG PO CAPS Oral Take 1 capsule by mouth every other day.    . TRAMADOL-ACETAMINOPHEN 37.5-325 MG PO TABS Oral Take 1 tablet by mouth every 6 (six) hours as needed for pain. 30 tablet 0     BP 111/60  Pulse 70  Temp 97.7 F (36.5 C) (Oral)  Resp 16  SpO2 98%  Physical Exam  Nursing note and vitals reviewed. Constitutional: He is oriented to person, place, and time. He appears well-developed and well-nourished.  HENT:  Head: Normocephalic and atraumatic.  Right Ear: External ear normal.  Left Ear: External ear normal.  Eyes: Conjunctivae and EOM are normal. Pupils are equal, round, and reactive to light.  Neck: Normal range of motion and phonation normal. Neck supple.  Cardiovascular: Normal rate, regular rhythm and normal heart sounds.   Pulmonary/Chest: Effort normal and breath sounds normal. He exhibits no bony tenderness.  Abdominal: Soft. Normal appearance. There is no tenderness.  Musculoskeletal:       Nontender spine. He guards against passive range of motion of the right shoulder and hip. He is able to ambulate, using his cane.  Neurological: He is alert and oriented to person, place, and time. He has normal strength. No cranial nerve deficit or sensory deficit. He exhibits normal muscle tone. Coordination normal.  Skin: Skin is warm, dry and intact.  Psychiatric: He has a normal mood and affect. His behavior is normal. Judgment and thought content normal.    ED Course  Procedures (  including critical care time)  Labs Reviewed - No data to display Dg Shoulder Right  02/11/2012  *RADIOLOGY REPORT*  Clinical Data: Fall.  Pain when raising arm.  RIGHT SHOULDER - 2+ VIEW  Comparison: None.  Findings: No evidence for fracture.  No evidence for shoulder separation or dislocation.  Bones are diffusely demineralized. Degenerative changes are seen at the acromioclavicular joint and rotator cuff insertion.  IMPRESSION: No acute bony findings.   Original Report Authenticated By: ERIC A. MANSELL, M.D.    Dg Hip Complete Right  02/11/2012  *RADIOLOGY REPORT*  Clinical Data: Fall with anterior hip pain  RIGHT HIP - COMPLETE 2+ VIEW  Comparison: None.  Findings: The  frontal view the pelvis shows diffuse bony demineralization. Helical blade fixation of the left hip is evident.  AP and frog-leg lateral views of the right hip show no evidence for right femoral neck fracture.  Joint space in the right hip is relatively well preserved without substantial hypertrophic spurring.  Symphysis pubis and SI joints are unremarkable.  No evidence for an acute pubic ramus fracture.  IMPRESSION: No acute bony findings.   Original Report Authenticated By: ERIC A. MANSELL, M.D.      1. Contusion shoulder/arm   2. Contusion, hip       MDM  Contusions, without fracture. Fall was mechanical in nature Doubt metabolic instability, serious bacterial infection or impending vascular collapse; the patient is stable for discharge.   Plan: Home Medications- Ultracet; Home Treatments- heat and gentle ROM exercices; Recommended follow up- PCP prn       Flint Melter, MD 02/11/12 1640

## 2012-02-11 NOTE — ED Notes (Signed)
Pt reports fall Saturday, must have twisted his foot.  Reports landing on his R shoulder.  Pt states he thought it will get better but pain is getting worse instead.

## 2012-02-23 ENCOUNTER — Emergency Department (HOSPITAL_COMMUNITY): Payer: MEDICARE

## 2012-02-23 ENCOUNTER — Emergency Department (HOSPITAL_COMMUNITY)
Admission: EM | Admit: 2012-02-23 | Discharge: 2012-02-24 | Disposition: A | Discharge status: Home / Self Care | Payer: MEDICARE | Attending: Emergency Medicine | Admitting: Emergency Medicine

## 2012-02-23 ENCOUNTER — Encounter (HOSPITAL_COMMUNITY): Payer: Self-pay | Admitting: *Deleted

## 2012-02-23 DIAGNOSIS — I10 Essential (primary) hypertension: Secondary | ICD-10-CM | POA: Insufficient documentation

## 2012-02-23 DIAGNOSIS — Z801 Family history of malignant neoplasm of trachea, bronchus and lung: Secondary | ICD-10-CM | POA: Insufficient documentation

## 2012-02-23 DIAGNOSIS — S76019A Strain of muscle, fascia and tendon of unspecified hip, initial encounter: Secondary | ICD-10-CM

## 2012-02-23 DIAGNOSIS — IMO0002 Reserved for concepts with insufficient information to code with codable children: Secondary | ICD-10-CM | POA: Insufficient documentation

## 2012-02-23 DIAGNOSIS — W1789XA Other fall from one level to another, initial encounter: Secondary | ICD-10-CM | POA: Insufficient documentation

## 2012-02-23 NOTE — ED Notes (Signed)
Pt seen her on the 3rd after a fall, continues to have rt leg pain

## 2012-02-24 NOTE — ED Provider Notes (Signed)
History     CSN: [id number]  Arrival date & time 02/23/12  1636   First MD Initiated Contact with Patient 02/23/12 2152      Chief Complaint  Patient presents with  . Leg Pain    rt    (Consider location/radiation/quality/duration/timing/severity/associated sxs/prior treatment) HPI Connor Miller is a 78 y.o. male who presented to the emergency department on August 4 after falling onto his lawn from his deck by tripping on a chair. He fell onto the long onto his right side hurt his shoulder and hip both of which were x-rayed at the time and showed no acute fracture. Since that time the patient has continued to have right leg pain which she says is worsening. It is located at the top of the leg, is worse with standing, he is now using a walker or cane to ambulate, denies any fevers chills or weight loss, no diaphoresis, chest pain or shortness of breath. He does bear weight on the leg which causes the pain to be worse. If he takes pressure off the leg it does not hurt at all.  Past Medical History  Diagnosis Date  . Anemia     NOS  . Hypertension   . Hypothyroidism     Past Surgical History  Procedure Date  . Eye surgery     Family History  Problem Relation Age of Onset  . Lung cancer Other   . Cancer Other     lung    History  Substance Use Topics  . Smoking status: Never Smoker   . Smokeless tobacco: Not on file  . Alcohol Use: 0.0 oz/week     occasionally,       Review of Systems At least 10pt or greater review of systems completed and are negative except where specified in the HPI.  Allergies  Review of patient's allergies indicates no known allergies.  Home Medications   Current Outpatient Rx  Name Route Sig Dispense Refill  . DORZOLAMIDE HCL-TIMOLOL MAL 22.3-6.8 MG/ML OP SOLN Left Eye Place 1 drop into the left eye 2 (two) times daily.     Marland Kitchen LEVOTHYROXINE SODIUM 75 MCG PO TABS Oral Take 1 tablet (75 mcg total) by mouth daily. 90 tablet 3    BRAND  MEDICALLY NECESSARY  . TRIAMTERENE-HCTZ 37.5-25 MG PO CAPS Oral Take 1 capsule by mouth every other day.      BP 129/69  Pulse 68  Temp 97.6 F (36.4 C) (Oral)  Resp 18  SpO2 96%  Physical Exam  Nursing notes reviewed.  Electronic medical record reviewed. VITAL SIGNS:   Filed Vitals:   02/23/12 1707  BP: 129/69  Pulse: 68  Temp: 97.6 F (36.4 C)  TempSrc: Oral  Resp: 18  SpO2: 96%   CONSTITUTIONAL: Awake, oriented, appears non-toxic HENT: Atraumatic, normocephalic, oral mucosa pink and moist, airway patent. Nares patent without drainage. External ears normal. EYES: Conjunctiva clear, EOMI, PERRLA NECK: Trachea midline, non-tender, supple CARDIOVASCULAR: Normal heart rate, Normal rhythm, No murmurs, rubs, gallops PULMONARY/CHEST: Clear to auscultation, no rhonchi, wheezes, or rales. Symmetrical breath sounds. Non-tender. ABDOMINAL: Non-distended, soft, non-tender - no rebound or guarding.  BS normal. NEUROLOGIC: Non-focal, moving all four extremities, no gross sensory or motor deficits. EXTREMITIES: No clubbing, cyanosis, or edema. Tenderness to palpation over the hip flexors and hip abductors - pain is most pronounced when actively abducting and a little bit less so against active hip flexion. Bedside ultrasound shows no hematoma. SKIN: Warm, Dry, No erythema, No  rash  ED Course  Procedures (including critical care time)  Labs Reviewed - No data to display Dg Hip Complete Right  02/23/2012  *RADIOLOGY REPORT*  Clinical Data: Fall.  Right hip pain.  RIGHT HIP - COMPLETE 2+ VIEW  Comparison: Plain films right hip 02/11/2012.  Findings: Both hips are located.  Old left intertrochanteric fracture with hardware in place noted.  No acute fracture is identified. Atherosclerotic vascular disease is noted.  IMPRESSION: No acute finding.  Stable compared to prior exam.   Original Report Authenticated By: Bernadene Bell. D'ALESSIO, M.D.    Dg Femur Right  02/23/2012  *RADIOLOGY REPORT*   Clinical Data: Fall 2 weeks ago.  Right hip and femur pain.  RIGHT FEMUR - 2 VIEW  Comparison: Plain films right hip 02/11/12.  Findings: There is no acute bony or joint abnormality. Atherosclerosis is noted.  There is some degenerative disease about the right hip.  IMPRESSION: No acute finding.   Original Report Authenticated By: Bernadene Bell. D'ALESSIO, M.D.      1. Strain of hip flexor       MDM  Connor Miller is a 78 y.o. male presenting with continued right hip pain. Followup x-rays they do show a sclerotic bone or healing bone consistent with the first read as no acute fracture. Patient does have tenderness over the hip flexor and hip abductor on the right, I did a bedside ultrasound of this region which showed no hematoma and otherwise normal tissue.  Patient discharged home stable and good condition with muscle strain. He continues his walker or cane to ambulate and has been using his Celebrex for pain relief. I urged him to follow up with his primary care physician in one week if the pain does not get better the patient may need some physical therapy to aid the process of healing.  Vision should return to the emergency department for any worsening symptoms        Jones Skene, MD 02/24/12 [phone]

## 2012-03-03 ENCOUNTER — Ambulatory Visit (INDEPENDENT_AMBULATORY_CARE_PROVIDER_SITE_OTHER): Payer: MEDICARE | Admitting: Internal Medicine

## 2012-03-03 ENCOUNTER — Other Ambulatory Visit (INDEPENDENT_AMBULATORY_CARE_PROVIDER_SITE_OTHER): Payer: MEDICARE

## 2012-03-03 ENCOUNTER — Encounter: Payer: Self-pay | Admitting: Internal Medicine

## 2012-03-03 VITALS — BP 124/68 | HR 71 | Temp 96.0°F | Resp 16 | Ht 71.0 in | Wt 132.0 lb

## 2012-03-03 DIAGNOSIS — E039 Hypothyroidism, unspecified: Secondary | ICD-10-CM

## 2012-03-03 DIAGNOSIS — I10 Essential (primary) hypertension: Secondary | ICD-10-CM

## 2012-03-03 DIAGNOSIS — M79659 Pain in unspecified thigh: Secondary | ICD-10-CM | POA: Insufficient documentation

## 2012-03-03 DIAGNOSIS — M79609 Pain in unspecified limb: Secondary | ICD-10-CM

## 2012-03-03 DIAGNOSIS — Z23 Encounter for immunization: Secondary | ICD-10-CM | POA: Insufficient documentation

## 2012-03-03 DIAGNOSIS — D518 Other vitamin B12 deficiency anemias: Secondary | ICD-10-CM

## 2012-03-03 DIAGNOSIS — M79606 Pain in leg, unspecified: Secondary | ICD-10-CM | POA: Insufficient documentation

## 2012-03-03 LAB — CBC WITH DIFFERENTIAL/PLATELET
Basophils Absolute: 0 10*3/uL (ref 0.0–0.1)
Eosinophils Absolute: 0 10*3/uL (ref 0.0–0.7)
Hemoglobin: 12.8 g/dL — ABNORMAL LOW (ref 13.0–17.0)
Lymphocytes Relative: 26 % (ref 12.0–46.0)
Lymphs Abs: 1.3 10*3/uL (ref 0.7–4.0)
Monocytes Relative: 9.1 % (ref 3.0–12.0)
Platelets: 214 10*3/uL (ref 150.0–400.0)
RBC: 3.71 Mil/uL — ABNORMAL LOW (ref 4.22–5.81)
RDW: 14.2 % (ref 11.5–14.6)
WBC: 4.9 10*3/uL (ref 4.5–10.5)

## 2012-03-03 LAB — BASIC METABOLIC PANEL
CO2: 32 mEq/L (ref 19–32)
Calcium: 9.1 mg/dL (ref 8.4–10.5)
Chloride: 95 mEq/L — ABNORMAL LOW (ref 96–112)
Glucose, Bld: 111 mg/dL — ABNORMAL HIGH (ref 70–99)
Potassium: 4.1 mEq/L (ref 3.5–5.1)
Sodium: 134 mEq/L — ABNORMAL LOW (ref 135–145)

## 2012-03-03 MED ORDER — CYANOCOBALAMIN 1000 MCG/ML IJ SOLN
1000.0000 ug | Freq: Once | INTRAMUSCULAR | Status: AC
  Administered 2012-03-03: 1000 ug via INTRAMUSCULAR

## 2012-03-03 NOTE — Patient Instructions (Signed)

## 2012-03-03 NOTE — Assessment & Plan Note (Signed)
PT referral for help with pain and to strengthen and mobilize his right leg

## 2012-03-03 NOTE — Assessment & Plan Note (Addendum)
His BP is well controlled, I will check his lytes and renal function today 

## 2012-03-03 NOTE — Progress Notes (Signed)
Subjective:    Patient ID: Connor Miller, male    DOB: 1922-09-02, 78 y.o.   MRN: [id number]  Injury The incident occurred more than 1 week ago. The incident occurred at home. The injury mechanism was a fall. Arm Injury Location: right upper arm. Leg Injury Location: right thigh. The pain is mild. Associated symptoms include weakness (right leg). Pertinent negatives include no abdominal pain, abnormal behavior, chest pain, coughing, headaches, inability to bear weight, light-headedness, loss of consciousness, memory loss, nausea, neck pain, numbness, seizures, tingling or vomiting. His tetanus status is UTD.      Review of Systems  HENT: Negative for facial swelling, neck pain and neck stiffness.   Eyes: Negative.   Respiratory: Negative for cough, chest tightness, shortness of breath, wheezing and stridor.   Cardiovascular: Negative.  Negative for chest pain.  Gastrointestinal: Negative for nausea, vomiting, abdominal pain, diarrhea and constipation.  Genitourinary: Negative.   Musculoskeletal: Positive for arthralgias (aching in right upper arm and right thigh). Negative for myalgias, back pain, joint swelling and gait problem.  Skin: Negative for color change, pallor, rash and wound.  Neurological: Positive for weakness (right leg). Negative for dizziness, tingling, tremors, seizures, loss of consciousness, syncope, facial asymmetry, speech difficulty, light-headedness, numbness and headaches.  Hematological: Negative for adenopathy. Does not bruise/bleed easily.  Psychiatric/Behavioral: Negative.  Negative for memory loss.       Objective:   Physical Exam  Vitals reviewed. Constitutional: He is oriented to person, place, and time. He appears well-developed and well-nourished.  Non-toxic appearance. He does not have a sickly appearance. He does not appear ill. No distress.  HENT:  Head: Normocephalic and atraumatic.  Mouth/Throat: Oropharynx is clear and moist. No oropharyngeal  exudate.  Eyes: Conjunctivae normal are normal. Right eye exhibits no discharge. Left eye exhibits no discharge. No scleral icterus.  Neck: Normal range of motion. Neck supple. No JVD present. No tracheal deviation present. No thyromegaly present.  Cardiovascular: Normal rate, regular rhythm and intact distal pulses.  Exam reveals no gallop and no friction rub.   No murmur heard. Pulmonary/Chest: Effort normal and breath sounds normal. No stridor. No respiratory distress. He has no wheezes. He has no rales. He exhibits no tenderness.  Abdominal: Soft. Bowel sounds are normal. He exhibits no distension and no mass. There is no tenderness. There is no rebound and no guarding.  Musculoskeletal: Normal range of motion. He exhibits no edema and no tenderness.  Lymphadenopathy:    He has no cervical adenopathy.  Neurological: He is alert and oriented to person, place, and time. He has normal strength and normal reflexes. He displays no atrophy, no tremor and normal reflexes. No cranial nerve deficit or sensory deficit. He exhibits normal muscle tone. He displays a negative Romberg sign. He displays no seizure activity. Gait (right antalgic gait) abnormal. Coordination normal.  Skin: Skin is warm and dry. No rash noted. He is not diaphoretic. No erythema. No pallor.  Psychiatric: He has a normal mood and affect. His behavior is normal. Judgment and thought content normal.      Lab Results  Component Value Date   WBC 4.6 09/18/2011   HGB 12.5* 09/18/2011   HCT 37.0* 09/18/2011   PLT 213.0 09/18/2011   GLUCOSE 86 11/20/2011   CHOL 152 11/20/2011   TRIG 45.0 11/20/2011   HDL 68.50 11/20/2011   LDLCALC 75 11/20/2011   ALT 17 11/20/2011   AST 17 11/20/2011   NA 140 11/20/2011   K 4.6 11/20/2011  CL 103 11/20/2011   CREATININE 1.0 11/20/2011   BUN 16 11/20/2011   CO2 33* 11/20/2011   TSH 0.92 11/20/2011   PSA 2.32 05/07/2011   HGBA1C 5.5 05/07/2011  Dg Hip Complete Right  02/23/2012  *RADIOLOGY REPORT*   Clinical Data: Fall.  Right hip pain.  RIGHT HIP - COMPLETE 2+ VIEW  Comparison: Plain films right hip 02/11/2012.  Findings: Both hips are located.  Old left intertrochanteric fracture with hardware in place noted.  No acute fracture is identified. Atherosclerotic vascular disease is noted.  IMPRESSION: No acute finding.  Stable compared to prior exam.   Original Report Authenticated By: Bernadene Bell. D'ALESSIO, M.D.    Dg Femur Right  02/23/2012  *RADIOLOGY REPORT*  Clinical Data: Fall 2 weeks ago.  Right hip and femur pain.  RIGHT FEMUR - 2 VIEW  Comparison: Plain films right hip 02/11/12.  Findings: There is no acute bony or joint abnormality. Atherosclerosis is noted.  There is some degenerative disease about the right hip.  IMPRESSION: No acute finding.   Original Report Authenticated By: Bernadene Bell. Maricela Curet, M.D.      Assessment & Plan:

## 2012-03-03 NOTE — Assessment & Plan Note (Signed)
F/up CBC today 

## 2012-03-03 NOTE — Assessment & Plan Note (Signed)
I will check his TSH today 

## 2012-03-15 ENCOUNTER — Ambulatory Visit: Payer: MEDICARE | Attending: Internal Medicine

## 2012-03-15 DIAGNOSIS — M25559 Pain in unspecified hip: Secondary | ICD-10-CM | POA: Insufficient documentation

## 2012-03-15 DIAGNOSIS — M25519 Pain in unspecified shoulder: Secondary | ICD-10-CM | POA: Insufficient documentation

## 2012-03-15 DIAGNOSIS — M25619 Stiffness of unspecified shoulder, not elsewhere classified: Secondary | ICD-10-CM | POA: Insufficient documentation

## 2012-03-15 DIAGNOSIS — R262 Difficulty in walking, not elsewhere classified: Secondary | ICD-10-CM | POA: Insufficient documentation

## 2012-03-15 DIAGNOSIS — IMO0001 Reserved for inherently not codable concepts without codable children: Secondary | ICD-10-CM | POA: Insufficient documentation

## 2012-03-17 ENCOUNTER — Ambulatory Visit: Payer: MEDICARE

## 2012-03-23 ENCOUNTER — Ambulatory Visit: Payer: MEDICARE

## 2012-03-24 ENCOUNTER — Other Ambulatory Visit (INDEPENDENT_AMBULATORY_CARE_PROVIDER_SITE_OTHER): Payer: MEDICARE

## 2012-03-24 ENCOUNTER — Ambulatory Visit (INDEPENDENT_AMBULATORY_CARE_PROVIDER_SITE_OTHER): Payer: MEDICARE | Admitting: Internal Medicine

## 2012-03-24 ENCOUNTER — Encounter: Payer: Self-pay | Admitting: Internal Medicine

## 2012-03-24 VITALS — BP 122/70 | HR 64 | Temp 96.7°F | Resp 18 | Ht 70.0 in | Wt 131.0 lb

## 2012-03-24 DIAGNOSIS — I10 Essential (primary) hypertension: Secondary | ICD-10-CM

## 2012-03-24 DIAGNOSIS — E039 Hypothyroidism, unspecified: Secondary | ICD-10-CM

## 2012-03-24 DIAGNOSIS — D518 Other vitamin B12 deficiency anemias: Secondary | ICD-10-CM

## 2012-03-24 DIAGNOSIS — N318 Other neuromuscular dysfunction of bladder: Secondary | ICD-10-CM

## 2012-03-24 DIAGNOSIS — N3281 Overactive bladder: Secondary | ICD-10-CM | POA: Insufficient documentation

## 2012-03-24 HISTORY — DX: Overactive bladder: N32.81

## 2012-03-24 LAB — BASIC METABOLIC PANEL
CO2: 34 mEq/L — ABNORMAL HIGH (ref 19–32)
Calcium: 8.9 mg/dL (ref 8.4–10.5)
Chloride: 100 mEq/L (ref 96–112)
Potassium: 3.8 mEq/L (ref 3.5–5.1)
Sodium: 137 mEq/L (ref 135–145)

## 2012-03-24 LAB — URINALYSIS, ROUTINE W REFLEX MICROSCOPIC
Leukocytes, UA: NEGATIVE
Specific Gravity, Urine: 1.02 (ref 1.000–1.030)
Urine Glucose: NEGATIVE
Urobilinogen, UA: 0.2 (ref 0.0–1.0)
pH: 6 (ref 5.0–8.0)

## 2012-03-24 MED ORDER — MIRABEGRON ER 25 MG PO TB24: 25.0000 mg | ORAL_TABLET | Freq: Every day | ORAL | Status: DC

## 2012-03-24 NOTE — Assessment & Plan Note (Signed)
His BP is well controlled, I will check his lytes and renal function 

## 2012-03-24 NOTE — Progress Notes (Signed)
  Subjective:    Patient ID: Connor Miller, male    DOB: Mar 30, 1923, 78 y.o.   MRN: [id number]  Urinary Frequency  This is a new problem. The current episode started more than 1 month ago. The problem occurs intermittently. The problem has been gradually worsening. The pain is at a severity of 0/10. The patient is experiencing no pain. There has been no fever. He is not sexually active. There is no history of pyelonephritis. Associated symptoms include frequency and urgency. Pertinent negatives include no chills, discharge, flank pain, hematuria, hesitancy, nausea, sweats or vomiting. He has tried nothing for the symptoms.      Review of Systems  Constitutional: Negative for fever, chills, diaphoresis, activity change, appetite change, fatigue and unexpected weight change.  HENT: Negative.   Eyes: Negative.   Respiratory: Negative for cough, choking, chest tightness, shortness of breath, wheezing and stridor.   Cardiovascular: Negative for chest pain, palpitations and leg swelling.  Gastrointestinal: Negative for nausea, vomiting, abdominal pain, diarrhea, constipation and blood in stool.  Genitourinary: Positive for urgency and frequency. Negative for dysuria, hesitancy, hematuria, flank pain, decreased urine volume, discharge, penile swelling, scrotal swelling, enuresis, genital sores, penile pain and testicular pain.  Musculoskeletal: Negative.   Skin: Negative.   Neurological: Negative.   Hematological: Negative for adenopathy. Does not bruise/bleed easily.  Psychiatric/Behavioral: Negative.        Objective:   Physical Exam  Vitals reviewed. Constitutional: He is oriented to person, place, and time. He appears well-developed and well-nourished. No distress.  HENT:  Head: Normocephalic and atraumatic.  Mouth/Throat: Oropharynx is clear and moist. No oropharyngeal exudate.  Eyes: Conjunctivae normal are normal. Right eye exhibits no discharge. Left eye exhibits no discharge. No  scleral icterus.  Neck: Normal range of motion. Neck supple. No JVD present. No tracheal deviation present. No thyromegaly present.  Cardiovascular: Normal rate, regular rhythm, normal heart sounds and intact distal pulses.  Exam reveals no gallop and no friction rub.   No murmur heard. Pulmonary/Chest: Effort normal and breath sounds normal. No stridor. No respiratory distress. He has no wheezes. He has no rales. He exhibits no tenderness.  Abdominal: Soft. Bowel sounds are normal. He exhibits no distension and no mass. There is no tenderness. There is no rebound and no guarding.  Musculoskeletal: Normal range of motion. He exhibits no edema and no tenderness.  Lymphadenopathy:    He has no cervical adenopathy.  Neurological: He is oriented to person, place, and time.  Skin: Skin is warm and dry. No rash noted. He is not diaphoretic. No erythema. No pallor.  Psychiatric: He has a normal mood and affect. His behavior is normal. Judgment and thought content normal.     Lab Results  Component Value Date   WBC 4.9 03/03/2012   HGB 12.8* 03/03/2012   HCT 38.7* 03/03/2012   PLT 214.0 03/03/2012   GLUCOSE 111* 03/03/2012   CHOL 152 11/20/2011   TRIG 45.0 11/20/2011   HDL 68.50 11/20/2011   LDLCALC 75 11/20/2011   ALT 17 11/20/2011   AST 17 11/20/2011   NA 134* 03/03/2012   K 4.1 03/03/2012   CL 95* 03/03/2012   CREATININE 1.0 03/03/2012   BUN 19 03/03/2012   CO2 32 03/03/2012   TSH 2.55 03/03/2012   PSA 2.32 05/07/2011   HGBA1C 5.5 05/07/2011       Assessment & Plan:

## 2012-03-24 NOTE — Assessment & Plan Note (Signed)
His recent TSH was normal 

## 2012-03-24 NOTE — Assessment & Plan Note (Signed)
I will check his BMP and UA today, he will try myrbetriq

## 2012-03-24 NOTE — Patient Instructions (Signed)

## 2012-03-25 ENCOUNTER — Ambulatory Visit: Payer: MEDICARE

## 2012-03-25 MED ORDER — CYANOCOBALAMIN 1000 MCG/ML IJ SOLN
1000.0000 ug | INTRAMUSCULAR | Status: DC
  Administered 2012-03-25: 1000 ug via INTRAMUSCULAR

## 2012-03-25 NOTE — Addendum Note (Signed)
Addended by: Rock Nephew T on: 03/25/2012 08:55 AM   Modules accepted: Orders

## 2012-03-29 ENCOUNTER — Ambulatory Visit: Payer: MEDICARE

## 2012-03-31 ENCOUNTER — Ambulatory Visit: Payer: MEDICARE

## 2012-04-05 ENCOUNTER — Other Ambulatory Visit: Payer: Self-pay | Admitting: Dermatology

## 2012-04-13 ENCOUNTER — Ambulatory Visit (INDEPENDENT_AMBULATORY_CARE_PROVIDER_SITE_OTHER): Payer: Medicare Other

## 2012-04-13 DIAGNOSIS — E039 Hypothyroidism, unspecified: Secondary | ICD-10-CM

## 2012-04-13 DIAGNOSIS — D518 Other vitamin B12 deficiency anemias: Secondary | ICD-10-CM

## 2012-04-13 MED ORDER — LEVOTHYROXINE SODIUM 75 MCG PO TABS: 75.0000 ug | ORAL_TABLET | Freq: Every day | ORAL | Status: DC

## 2012-04-13 MED ORDER — CYANOCOBALAMIN 1000 MCG/ML IJ SOLN
1000.0000 ug | Freq: Once | INTRAMUSCULAR | Status: AC
  Administered 2012-04-13: 1000 ug via INTRAMUSCULAR

## 2012-04-14 ENCOUNTER — Ambulatory Visit: Payer: MEDICARE

## 2012-05-11 ENCOUNTER — Ambulatory Visit (INDEPENDENT_AMBULATORY_CARE_PROVIDER_SITE_OTHER): Payer: MEDICARE | Admitting: *Deleted

## 2012-05-11 DIAGNOSIS — D518 Other vitamin B12 deficiency anemias: Secondary | ICD-10-CM

## 2012-05-11 MED ORDER — CYANOCOBALAMIN 1000 MCG/ML IJ SOLN
1000.0000 ug | Freq: Once | INTRAMUSCULAR | Status: AC
  Administered 2012-05-11: 1000 ug via INTRAMUSCULAR

## 2012-05-14 ENCOUNTER — Ambulatory Visit: Payer: MEDICARE | Attending: Internal Medicine

## 2012-05-14 DIAGNOSIS — IMO0001 Reserved for inherently not codable concepts without codable children: Secondary | ICD-10-CM | POA: Insufficient documentation

## 2012-05-14 DIAGNOSIS — R262 Difficulty in walking, not elsewhere classified: Secondary | ICD-10-CM | POA: Insufficient documentation

## 2012-05-14 DIAGNOSIS — M25519 Pain in unspecified shoulder: Secondary | ICD-10-CM | POA: Insufficient documentation

## 2012-05-14 DIAGNOSIS — M25559 Pain in unspecified hip: Secondary | ICD-10-CM | POA: Insufficient documentation

## 2012-05-14 DIAGNOSIS — M25619 Stiffness of unspecified shoulder, not elsewhere classified: Secondary | ICD-10-CM | POA: Insufficient documentation

## 2012-06-11 ENCOUNTER — Ambulatory Visit: Payer: 59

## 2012-07-28 ENCOUNTER — Ambulatory Visit: Payer: MEDICARE | Admitting: Internal Medicine

## 2012-08-05 ENCOUNTER — Encounter: Payer: Self-pay | Admitting: Internal Medicine

## 2012-08-05 ENCOUNTER — Ambulatory Visit (INDEPENDENT_AMBULATORY_CARE_PROVIDER_SITE_OTHER): Payer: 59 | Admitting: Internal Medicine

## 2012-08-05 ENCOUNTER — Other Ambulatory Visit (INDEPENDENT_AMBULATORY_CARE_PROVIDER_SITE_OTHER): Payer: 59

## 2012-08-05 VITALS — BP 108/64 | HR 54 | Temp 97.6°F | Resp 10

## 2012-08-05 DIAGNOSIS — E039 Hypothyroidism, unspecified: Secondary | ICD-10-CM

## 2012-08-05 DIAGNOSIS — I10 Essential (primary) hypertension: Secondary | ICD-10-CM

## 2012-08-05 DIAGNOSIS — L2089 Other atopic dermatitis: Secondary | ICD-10-CM

## 2012-08-05 DIAGNOSIS — D518 Other vitamin B12 deficiency anemias: Secondary | ICD-10-CM

## 2012-08-05 DIAGNOSIS — L209 Atopic dermatitis, unspecified: Secondary | ICD-10-CM

## 2012-08-05 HISTORY — DX: Atopic dermatitis, unspecified: L20.9

## 2012-08-05 LAB — BASIC METABOLIC PANEL
BUN: 19 mg/dL (ref 6–23)
CO2: 31 mEq/L (ref 19–32)
Chloride: 103 mEq/L (ref 96–112)
Glucose, Bld: 86 mg/dL (ref 70–99)
Potassium: 4.2 mEq/L (ref 3.5–5.1)

## 2012-08-05 MED ORDER — CYANOCOBALAMIN 1000 MCG/ML IJ SOLN
1000.0000 ug | Freq: Once | INTRAMUSCULAR | Status: AC
  Administered 2012-08-05: 1000 ug via INTRAMUSCULAR

## 2012-08-05 MED ORDER — TRIAMCINOLONE ACETONIDE 0.5 % EX CREA: TOPICAL_CREAM | Freq: Three times a day (TID) | CUTANEOUS | Status: DC

## 2012-08-05 NOTE — Assessment & Plan Note (Signed)
I will check his TSH level and will adjust the dose if needed

## 2012-08-05 NOTE — Progress Notes (Signed)
Subjective:    Patient ID: Connor Miller, male    DOB: 04-Oct-1922, 79 y.o.   MRN: [id number]  Thyroid Problem Presents for follow-up visit. Patient reports no anxiety, cold intolerance, constipation, depressed mood, diaphoresis, diarrhea, dry skin, fatigue, hair loss, heat intolerance, hoarse voice, leg swelling, nail problem, palpitations, tremors, visual change, weight gain or weight loss. The symptoms have been stable.  Rash This is a new problem. The current episode started more than 1 month ago. The problem has been rapidly worsening since onset. The affected locations include the torso. The rash is characterized by dryness and itchiness. He was exposed to nothing. Pertinent negatives include no anorexia, congestion, cough, diarrhea, eye pain, facial edema, fatigue, fever, joint pain, nail changes, rhinorrhea, shortness of breath, sore throat or vomiting. Past treatments include nothing.      Review of Systems  Constitutional: Negative.  Negative for fever, weight loss, weight gain, diaphoresis and fatigue.  HENT: Negative.  Negative for congestion, sore throat, hoarse voice and rhinorrhea.   Eyes: Negative.  Negative for pain.  Respiratory: Negative.  Negative for cough and shortness of breath.   Cardiovascular: Negative.  Negative for palpitations.  Gastrointestinal: Negative.  Negative for vomiting, diarrhea, constipation and anorexia.  Endocrine: Negative.  Negative for cold intolerance and heat intolerance.  Genitourinary: Negative.   Musculoskeletal: Negative.  Negative for joint pain.  Skin: Positive for rash. Negative for nail changes.  Allergic/Immunologic: Negative.   Neurological: Negative.  Negative for tremors.  Hematological: Negative.   Psychiatric/Behavioral: Negative.        Objective:   Physical Exam  Vitals reviewed. Constitutional: He is oriented to person, place, and time. He appears well-developed and well-nourished. No distress.  HENT:  Head:  Normocephalic and atraumatic.  Mouth/Throat: Oropharynx is clear and moist. No oropharyngeal exudate.  Eyes: Conjunctivae are normal. Right eye exhibits no discharge. Left eye exhibits no discharge. No scleral icterus.  Neck: Normal range of motion. Neck supple. No JVD present. No tracheal deviation present. No thyromegaly present.  Cardiovascular: Normal rate, regular rhythm, normal heart sounds and intact distal pulses.  Exam reveals no gallop and no friction rub.   No murmur heard. Pulmonary/Chest: Effort normal and breath sounds normal. No stridor. No respiratory distress. He has no wheezes. He has no rales. He exhibits no tenderness.  Abdominal: Soft. Bowel sounds are normal. He exhibits no distension and no mass. There is no tenderness. There is no rebound and no guarding.  Musculoskeletal: Normal range of motion. He exhibits no edema and no tenderness.  Lymphadenopathy:    He has no cervical adenopathy.  Neurological: He is oriented to person, place, and time.  Skin: Skin is warm, dry and intact. Rash noted. No abrasion, no bruising, no burn, no ecchymosis, no laceration, no lesion, no petechiae and no purpura noted. Rash is papular. Rash is not macular, not maculopapular, not nodular, not pustular, not vesicular and not urticarial. He is not diaphoretic. No erythema. No pallor.     Psychiatric: He has a normal mood and affect. His behavior is normal. Judgment and thought content normal.      Lab Results  Component Value Date   WBC 4.9 03/03/2012   HGB 12.8* 03/03/2012   HCT 38.7* 03/03/2012   PLT 214.0 03/03/2012   GLUCOSE 83 03/24/2012   CHOL 152 11/20/2011   TRIG 45.0 11/20/2011   HDL 68.50 11/20/2011   LDLCALC 75 11/20/2011   ALT 17 11/20/2011   AST 17 11/20/2011   NA  137 03/24/2012   K 3.8 03/24/2012   CL 100 03/24/2012   CREATININE 1.0 03/24/2012   BUN 12 03/24/2012   CO2 34* 03/24/2012   TSH 2.55 03/03/2012   PSA 2.32 05/07/2011   HGBA1C 5.5 05/07/2011      Assessment &  Plan:

## 2012-08-05 NOTE — Assessment & Plan Note (Signed)
Start TAC cream to AA BID

## 2012-08-05 NOTE — Assessment & Plan Note (Signed)
B12 injection today 

## 2012-08-05 NOTE — Assessment & Plan Note (Signed)
His BP is well controlled Today I will check his lytes and renal function 

## 2012-08-05 NOTE — Patient Instructions (Addendum)
Hypothyroidism The thyroid is a large gland located in the lower front of your neck. The thyroid gland helps control metabolism. Metabolism is how your body handles food. It controls metabolism with the hormone thyroxine. When this gland is underactive (hypothyroid), it produces too little hormone.  CAUSES These include:   Absence or destruction of thyroid tissue.  Goiter due to iodine deficiency.  Goiter due to medications.  Congenital defects (since birth).  Problems with the pituitary. This causes a lack of TSH (thyroid stimulating hormone). This hormone tells the thyroid to turn out more hormone. SYMPTOMS  Lethargy (feeling as though you have no energy)  Cold intolerance  Weight gain (in spite of normal food intake)  Dry skin  Coarse hair  Menstrual irregularity (if severe, may lead to infertility)  Slowing of thought processes Cardiac problems are also caused by insufficient amounts of thyroid hormone. Hypothyroidism in the newborn is cretinism, and is an extreme form. It is important that this form be treated adequately and immediately or it will lead rapidly to retarded physical and mental development. DIAGNOSIS  To prove hypothyroidism, your caregiver may do blood tests and ultrasound tests. Sometimes the signs are hidden. It may be necessary for your caregiver to watch this illness with blood tests either before or after diagnosis and treatment. TREATMENT  Low levels of thyroid hormone are increased by using synthetic thyroid hormone. This is a safe, effective treatment. It usually takes about four weeks to gain the full effects of the medication. After you have the full effect of the medication, it will generally take another four weeks for problems to leave. Your caregiver may start you on low doses. If you have had heart problems the dose may be gradually increased. It is generally not an emergency to get rapidly to normal. HOME CARE INSTRUCTIONS   Take your  medications as your caregiver suggests. Let your caregiver know of any medications you are taking or start taking. Your caregiver will help you with dosage schedules.  As your condition improves, your dosage needs may increase. It will be necessary to have continuing blood tests as suggested by your caregiver.  Report all suspected medication side effects to your caregiver. SEEK MEDICAL CARE IF: Seek medical care if you develop:  Sweating.  Tremulousness (tremors).  Anxiety.  Rapid weight loss.  Heat intolerance.  Emotional swings.  Diarrhea.  Weakness. SEEK IMMEDIATE MEDICAL CARE IF:  You develop chest pain, an irregular heart beat (palpitations), or a rapid heart beat. MAKE SURE YOU:   Understand these instructions.  Will watch your condition.  Will get help right away if you are not doing well or get worse. Document Released: 05/26/2005 Document Revised: 08/18/2011 Document Reviewed: 01/14/2008 [facility] Patient Information 2013 [place], [place]. Eczema Atopic dermatitis, or eczema, is an inherited type of sensitive skin. Often people with eczema have a family history of allergies, asthma, or hay fever. It causes a red itchy rash and dry scaly skin. The itchiness may occur before the skin rash and may be very intense. It is not contagious. Eczema is generally worse during the cooler winter months and often improves with the warmth of summer. Eczema usually starts showing signs in infancy. Some children outgrow eczema, but it may last through adulthood. Flare-ups may be caused by:  Eating something or contact with something you are sensitive or allergic to.  Stress. DIAGNOSIS  The diagnosis of eczema is usually based upon symptoms and medical history. TREATMENT  Eczema cannot be cured, but symptoms  usually can be controlled with treatment or avoidance of allergens (things to which you are sensitive or allergic to).  Controlling the itching and scratching.  Use  over-the-counter antihistamines as directed for itching. It is especially useful at night when the itching tends to be worse.  Use over-the-counter steroid creams as directed for itching.  Scratching makes the rash and itching worse and may cause impetigo (a skin infection) if fingernails are contaminated (dirty).  Keeping the skin well moisturized with creams every day. This will seal in moisture and help prevent dryness. Lotions containing alcohol and water can dry the skin and are not recommended.  Limiting exposure to allergens.  Recognizing situations that cause stress.  Developing a plan to manage stress. HOME CARE INSTRUCTIONS   Take prescription and over-the-counter medicines as directed by your caregiver.  Do not use anything on the skin without checking with your caregiver.  Keep baths or showers short (5 minutes) in warm (not hot) water. Use mild cleansers for bathing. You may add non-perfumed bath oil to the bath water. It is best to avoid soap and bubble bath.  Immediately after a bath or shower, when the skin is still damp, apply a moisturizing ointment to the entire body. This ointment should be a petroleum ointment. This will seal in moisture and help prevent dryness. The thicker the ointment the better. These should be unscented.  Keep fingernails cut short and wash hands often. If your child has eczema, it may be necessary to put soft gloves or mittens on your child at night.  Dress in clothes made of cotton or cotton blends. Dress lightly, as heat increases itching.  Avoid foods that may cause flare-ups. Common foods include cow's milk, peanut butter, eggs and wheat.  Keep a child with eczema away from anyone with fever blisters. The virus that causes fever blisters (herpes simplex) can cause a serious skin infection in children with eczema. SEEK MEDICAL CARE IF:   Itching interferes with sleep.  The rash gets worse or is not better within one week following  treatment.  The rash looks infected (pus or soft yellow scabs).  You or your child has an oral temperature above 102 F (38.9 C).  Your baby is older than 3 months with a rectal temperature of 100.5 F (38.1 C) or higher for more than 1 day.  The rash flares up after contact with someone who has fever blisters. SEEK IMMEDIATE MEDICAL CARE IF:   Your baby is older than 3 months with a rectal temperature of 102 F (38.9 C) or higher.  Your baby is older than 3 months or younger with a rectal temperature of 100.4 F (38 C) or higher. Document Released: 05/23/2000 Document Revised: 08/18/2011 Document Reviewed: 03/28/2009 [facility] Patient Information 2013 [place], [place].

## 2012-09-07 ENCOUNTER — Ambulatory Visit: Payer: 59

## 2012-09-14 ENCOUNTER — Ambulatory Visit (INDEPENDENT_AMBULATORY_CARE_PROVIDER_SITE_OTHER): Payer: 59

## 2012-09-14 DIAGNOSIS — D518 Other vitamin B12 deficiency anemias: Secondary | ICD-10-CM

## 2012-09-14 MED ORDER — CYANOCOBALAMIN 1000 MCG/ML IJ SOLN
1000.0000 ug | Freq: Once | INTRAMUSCULAR | Status: AC
  Administered 2012-09-14: 1000 ug via INTRAMUSCULAR

## 2012-09-21 ENCOUNTER — Other Ambulatory Visit: Payer: Self-pay | Admitting: Dermatology

## 2012-10-12 ENCOUNTER — Other Ambulatory Visit: Payer: Self-pay

## 2012-10-12 ENCOUNTER — Ambulatory Visit (INDEPENDENT_AMBULATORY_CARE_PROVIDER_SITE_OTHER): Payer: 59

## 2012-10-12 DIAGNOSIS — E039 Hypothyroidism, unspecified: Secondary | ICD-10-CM

## 2012-10-12 DIAGNOSIS — D518 Other vitamin B12 deficiency anemias: Secondary | ICD-10-CM

## 2012-10-12 MED ORDER — CYANOCOBALAMIN 1000 MCG/ML IJ SOLN
1000.0000 ug | Freq: Once | INTRAMUSCULAR | Status: AC
  Administered 2012-10-12: 1000 ug via INTRAMUSCULAR

## 2012-10-12 MED ORDER — LEVOTHYROXINE SODIUM 75 MCG PO TABS: 75.0000 ug | ORAL_TABLET | Freq: Every day | ORAL | Status: DC

## 2012-11-09 ENCOUNTER — Ambulatory Visit (INDEPENDENT_AMBULATORY_CARE_PROVIDER_SITE_OTHER): Payer: 59

## 2012-11-09 DIAGNOSIS — D518 Other vitamin B12 deficiency anemias: Secondary | ICD-10-CM

## 2012-11-09 MED ORDER — CYANOCOBALAMIN 1000 MCG/ML IJ SOLN
1000.0000 ug | Freq: Once | INTRAMUSCULAR | Status: AC
  Administered 2012-11-09: 1000 ug via INTRAMUSCULAR

## 2012-12-03 ENCOUNTER — Encounter: Payer: Self-pay | Admitting: Internal Medicine

## 2012-12-03 ENCOUNTER — Ambulatory Visit (INDEPENDENT_AMBULATORY_CARE_PROVIDER_SITE_OTHER): Payer: 59 | Admitting: Internal Medicine

## 2012-12-03 ENCOUNTER — Other Ambulatory Visit (INDEPENDENT_AMBULATORY_CARE_PROVIDER_SITE_OTHER): Payer: 59

## 2012-12-03 VITALS — BP 112/70 | HR 49 | Temp 97.4°F | Resp 20 | Wt 134.0 lb

## 2012-12-03 DIAGNOSIS — D518 Other vitamin B12 deficiency anemias: Secondary | ICD-10-CM

## 2012-12-03 DIAGNOSIS — E039 Hypothyroidism, unspecified: Secondary | ICD-10-CM

## 2012-12-03 DIAGNOSIS — I10 Essential (primary) hypertension: Secondary | ICD-10-CM

## 2012-12-03 LAB — CBC WITH DIFFERENTIAL/PLATELET
Basophils Absolute: 0 10*3/uL (ref 0.0–0.1)
Eosinophils Absolute: 0.1 10*3/uL (ref 0.0–0.7)
Hemoglobin: 12.1 g/dL — ABNORMAL LOW (ref 13.0–17.0)
Lymphocytes Relative: 37 % (ref 12.0–46.0)
MCHC: 34.1 g/dL (ref 30.0–36.0)
Monocytes Relative: 12.2 % — ABNORMAL HIGH (ref 3.0–12.0)
Neutro Abs: 2.1 10*3/uL (ref 1.4–7.7)
Neutrophils Relative %: 48.8 % (ref 43.0–77.0)
Platelets: 203 10*3/uL (ref 150.0–400.0)
RDW: 14.4 % (ref 11.5–14.6)

## 2012-12-03 LAB — BASIC METABOLIC PANEL
Calcium: 9.1 mg/dL (ref 8.4–10.5)
Creatinine, Ser: 1 mg/dL (ref 0.4–1.5)
GFR: 79.55 mL/min (ref >60.00)
Glucose, Bld: 90 mg/dL (ref 70–99)
Sodium: 137 mEq/L (ref 135–145)

## 2012-12-03 MED ORDER — CYANOCOBALAMIN 1000 MCG/ML IJ SOLN
1000.0000 ug | Freq: Once | INTRAMUSCULAR | Status: AC
  Administered 2012-12-03: 1000 ug via INTRAMUSCULAR

## 2012-12-03 NOTE — Patient Instructions (Signed)

## 2012-12-03 NOTE — Assessment & Plan Note (Signed)
CBC today Continue B12 injections

## 2012-12-03 NOTE — Assessment & Plan Note (Signed)
His BP is well controlled Today I will check his lytes and renal function 

## 2012-12-03 NOTE — Progress Notes (Signed)
  Subjective:    Patient ID: Connor Miller, male    DOB: 1923-05-15, 79 y.o.   MRN: [id number]  Thyroid Problem Presents for follow-up visit. Symptoms include fatigue. Patient reports no anxiety, cold intolerance, constipation, depressed mood, diaphoresis, diarrhea, dry skin, hair loss, heat intolerance, hoarse voice, leg swelling, nail problem, palpitations, tremors, visual change, weight gain or weight loss. The symptoms have been stable.      Review of Systems  Constitutional: Positive for fatigue. Negative for fever, chills, weight loss, weight gain, diaphoresis, activity change, appetite change and unexpected weight change.  HENT: Negative.  Negative for hoarse voice.   Eyes: Negative.   Respiratory: Negative.  Negative for cough, chest tightness, shortness of breath, wheezing and stridor.   Cardiovascular: Negative.  Negative for chest pain, palpitations and leg swelling.  Gastrointestinal: Negative.  Negative for vomiting, abdominal pain, diarrhea, constipation, blood in stool and anal bleeding.  Endocrine: Negative.  Negative for cold intolerance and heat intolerance.  Genitourinary: Negative.   Musculoskeletal: Negative.   Skin: Negative.   Allergic/Immunologic: Negative.   Neurological: Negative.  Negative for tremors.  Hematological: Negative.  Negative for adenopathy. Does not bruise/bleed easily.  Psychiatric/Behavioral: Negative.        Objective:   Physical Exam  Vitals reviewed. Constitutional: He is oriented to person, place, and time. He appears well-developed and well-nourished. No distress.  HENT:  Head: Normocephalic and atraumatic.  Mouth/Throat: Oropharynx is clear and moist. No oropharyngeal exudate.  Eyes: Conjunctivae are normal. Right eye exhibits no discharge. Left eye exhibits no discharge. No scleral icterus.  Neck: Normal range of motion. Neck supple. No JVD present. No tracheal deviation present. No thyromegaly present.  Cardiovascular: Normal rate,  regular rhythm, normal heart sounds and intact distal pulses.  Exam reveals no gallop and no friction rub.   No murmur heard. Pulmonary/Chest: Effort normal and breath sounds normal. No stridor. No respiratory distress. He has no wheezes. He has no rales. He exhibits no tenderness.  Abdominal: Soft. Bowel sounds are normal. He exhibits no distension and no mass. There is no tenderness. There is no rebound and no guarding.  Musculoskeletal: Normal range of motion. He exhibits no edema and no tenderness.  Lymphadenopathy:    He has no cervical adenopathy.  Neurological: He is oriented to person, place, and time.  Skin: Skin is warm and dry. No rash noted. He is not diaphoretic. No erythema. No pallor.  Psychiatric: He has a normal mood and affect. His behavior is normal. Judgment and thought content normal.     Lab Results  Component Value Date   WBC 4.9 03/03/2012   HGB 12.8* 03/03/2012   HCT 38.7* 03/03/2012   PLT 214.0 03/03/2012   GLUCOSE 86 08/05/2012   CHOL 152 11/20/2011   TRIG 45.0 11/20/2011   HDL 68.50 11/20/2011   LDLCALC 75 11/20/2011   ALT 17 11/20/2011   AST 17 11/20/2011   NA 139 08/05/2012   K 4.2 08/05/2012   CL 103 08/05/2012   CREATININE 1.1 08/05/2012   BUN 19 08/05/2012   CO2 31 08/05/2012   TSH 1.84 08/05/2012   PSA 2.32 05/07/2011   HGBA1C 5.5 05/07/2011       Assessment & Plan:

## 2012-12-03 NOTE — Assessment & Plan Note (Signed)
I will recheck his TSH an will adjust his dose if needed

## 2012-12-07 ENCOUNTER — Telehealth: Payer: Self-pay

## 2012-12-07 NOTE — Telephone Encounter (Signed)
Stay with one capsule per day

## 2012-12-07 NOTE — Telephone Encounter (Signed)
Received [facility] from [facility] stating that they received a Rx for triamterene 1/2 capsule po a day.  Per pharmacy, capsules can not be split,they are requesting clarification, pt history is for 1 cap daily. Please advise

## 2012-12-08 MED ORDER — TRIAMTERENE-HCTZ 37.5-25 MG PO CAPS: 1.0000 | ORAL_CAPSULE | Freq: Every day | ORAL | Status: DC

## 2013-01-07 ENCOUNTER — Ambulatory Visit (INDEPENDENT_AMBULATORY_CARE_PROVIDER_SITE_OTHER): Payer: 59

## 2013-01-07 DIAGNOSIS — D518 Other vitamin B12 deficiency anemias: Secondary | ICD-10-CM

## 2013-01-07 MED ORDER — CYANOCOBALAMIN 1000 MCG/ML IJ SOLN
1000.0000 ug | Freq: Once | INTRAMUSCULAR | Status: AC
  Administered 2013-01-07: 1000 ug via INTRAMUSCULAR

## 2013-01-20 ENCOUNTER — Ambulatory Visit (INDEPENDENT_AMBULATORY_CARE_PROVIDER_SITE_OTHER): Payer: 59 | Admitting: Internal Medicine

## 2013-01-20 VITALS — BP 110/70 | HR 60 | Temp 98.5°F | Resp 16 | Wt 134.0 lb

## 2013-01-20 DIAGNOSIS — I70219 Atherosclerosis of native arteries of extremities with intermittent claudication, unspecified extremity: Secondary | ICD-10-CM

## 2013-01-20 HISTORY — DX: Atherosclerosis of native arteries of extremities with intermittent claudication, unspecified extremity: I70.219

## 2013-01-20 NOTE — Patient Instructions (Signed)
Intermittent Claudication Blockage of leg arteries results from poor circulation of blood in the leg arteries. This produces an aching, tired, and sometimes burning pain in the legs that is brought on by exercise and made better by rest. Claudication refers to the limping that happens from leg cramps. It is also referred to as Vaso-occlusive disease of the legs, arterial insufficiency of the legs, recurrent leg pain, recurrent leg cramping and calf pain with exercise.  CAUSES  This condition is due to narrowing or blockage of the arteries (muscular vessels which carry blood away from the heart and around the body). Blockage of arteries can occur anywhere in the body. If they occur in the heart, a person may experience angina (chest pain) or even a heart attack. If they occur in the neck or the brain, a person may have a stroke. Intermittent claudication is when the blockage occurs in the legs, most commonly in the calf or the foot.  Atherosclerosis, or blockage of arteries, can occur for many reasons. Some of these are smoking, diabetes, and high cholesterol. SYMPTOMS  Intermittent claudication may occur in both legs, and it often continues to get worse over time. However, some people complain only of weakness in the legs when walking, or a feeling of "tiredness" in the buttocks. Impotence (not able to have an erection) is an occasional complaint in men. Pain while resting is uncommon.  WHAT TO EXPECT AT YOUR HEALTH CARE PROVIDER'S OFFICE: Your medical history will be asked for and a physical examination will be performed. Medical history questions documenting claudication in detail may include:   Time pattern  Do you have leg cramps at night (nocturnal cramps)?  How often does leg pain with cramping occur?  Is it getting worse?  What is the quality of the pain?  Is the pain sharp?  Is there an aching pain with the cramps?  Aggravating factors  Is it worse after you exercise?  Is it  worse after you are standing for a while?  Do you smoke? How much?  Do you drink alcohol? How much?  Are you diabetic? How well is your blood sugar controlled?  Other  What other symptoms are also present?  Has there been impotence (men)?  Is there pain in the back?  Is there a darkening of the skin of the legs, feet or toes?  Is there weakness or paralysis of the legs? The physical examination may include evaluation of the femoral pulse (in the groin) and the other areas where the pulse can be felt in the legs. DIAGNOSIS  Diagnostic tests that may be performed include:  Blood pressure measured in arms and legs for comparison.  Doppler ultrasonography on the legs and the heart.  Duplex Doppler/ultrasound exam of extremity to visualize arterial blood flow.  ECG- to evaluate the activity of your heart.  Aortography- to visualize blockages in your arteries. TREATMENT Surgical treatment may be suggested if claudication interferes with the patient's activities or work, and if the diseased arteries do not seem to be improving after treatment. Be aware that this condition can worsen over time and you should carefully monitor your condition. HOME CARE INSTRUCTIONS  Talk to your caregiver about the cause of your leg cramping and about what to do at home to relieve it.  A healthy diet is important to lessen the likeliness of atherosclerosis.  A program of daily walking for short periods, and stopping for pain or cramping, may help improve function.  It is important to   stop smoking.  Avoid putting hot or cold items on legs.  Avoid tight shoes. SEEK MEDICAL CARE IF: There are many other causes of leg pain such as arthritis or low blood potassium. However, some causes of leg pain may be life threatening such as a blood clot in the legs. Seek medical attention if you have:  Leg pain that does not go away.  Legs that may be red, hot or swollen.  Ulcers or sores appear on your  ankle or foot.  Any chest pain or shortness of breath accompanying leg pain.  Diabetes.  You are pregnant. SEEK IMMEDIATE MEDICAL CARE IF:   Your leg pain becomes severe or will not go away.  Your foot turns blue or a dark color.  Your leg becomes red, hot or swollen or you develop a fever over 102F.  Any chest pain or shortness of breath accompanying leg pain. MAKE SURE YOU:   Understand these instructions.  Will watch your condition.  Will get help right away if you are not doing well or get worse. Document Released: 03/28/2004 Document Revised: 08/18/2011 Document Reviewed: 01/14/2008 [employer] Patient Information 2014 [employer].  

## 2013-01-20 NOTE — Progress Notes (Signed)
Subjective:    Patient ID: Connor Miller, male    DOB: 1922-09-09, 79 y.o.   MRN: [id number]  HPI Comments: He returns and complains of tiredness and fatigue from the waist down with exertion for the last 2 months. He is concerned that this may be claudication.     Review of Systems  Constitutional: Positive for fatigue. Negative for fever, chills, diaphoresis, activity change, appetite change and unexpected weight change.  HENT: Negative.   Eyes: Negative.   Respiratory: Negative.  Negative for cough, chest tightness, shortness of breath, wheezing and stridor.   Cardiovascular: Negative.  Negative for chest pain, palpitations and leg swelling.  Gastrointestinal: Negative for abdominal pain.  Endocrine: Negative.   Genitourinary: Negative.   Musculoskeletal: Negative.  Negative for myalgias, back pain, joint swelling, arthralgias and gait problem.  Skin: Negative.   Allergic/Immunologic: Negative.   Neurological: Negative.   Hematological: Negative.   Psychiatric/Behavioral: Negative.        Objective:   Physical Exam  Vitals reviewed. Constitutional: He is oriented to person, place, and time. He appears well-developed and well-nourished. No distress.  HENT:  Head: Normocephalic and atraumatic.  Mouth/Throat: Oropharynx is clear and moist. No oropharyngeal exudate.  Eyes: Conjunctivae are normal. Right eye exhibits no discharge. Left eye exhibits no discharge. No scleral icterus.  Neck: Normal range of motion. Neck supple. No JVD present. No tracheal deviation present. No thyromegaly present.  Cardiovascular: Normal rate, regular rhythm, normal heart sounds and intact distal pulses.  Exam reveals no gallop and no friction rub.   No murmur heard. Pulses:      Carotid pulses are 1+ on the right side, and 1+ on the left side.      Radial pulses are 1+ on the right side, and 1+ on the left side.       Femoral pulses are 1+ on the right side, and 1+ on the left side.  Popliteal pulses are 1+ on the right side, and 1+ on the left side.       Dorsalis pedis pulses are 1+ on the right side, and 1+ on the left side.       Posterior tibial pulses are 0 on the right side, and 0 on the left side.  Pulmonary/Chest: Effort normal and breath sounds normal. No stridor. No respiratory distress. He has no wheezes. He has no rales. He exhibits no tenderness.  Abdominal: Soft. Bowel sounds are normal. He exhibits no distension and no mass. There is no tenderness. There is no rebound and no guarding.  Musculoskeletal: Normal range of motion. He exhibits no edema and no tenderness.       Lumbar back: Normal. He exhibits normal range of motion, no tenderness and no bony tenderness.  Lymphadenopathy:    He has no cervical adenopathy.  Neurological: He is alert and oriented to person, place, and time. He has normal reflexes.  Skin: Skin is warm and dry. No rash noted. He is not diaphoretic. No erythema. No pallor.     Lab Results  Component Value Date   WBC 4.4* 12/03/2012   HGB 12.1* 12/03/2012   HCT 35.5* 12/03/2012   PLT 203.0 12/03/2012   GLUCOSE 90 12/03/2012   CHOL 152 11/20/2011   TRIG 45.0 11/20/2011   HDL 68.50 11/20/2011   LDLCALC 75 11/20/2011   ALT 17 11/20/2011   AST 17 11/20/2011   NA 137 12/03/2012   K 4.1 12/03/2012   CL 102 12/03/2012   CREATININE 1.0 12/03/2012  BUN 15 12/03/2012   CO2 33* 12/03/2012   TSH 0.99 12/03/2012   PSA 2.32 05/07/2011   HGBA1C 5.5 05/07/2011       Assessment & Plan:

## 2013-01-21 DIAGNOSIS — R0989 Other specified symptoms and signs involving the circulatory and respiratory systems: Secondary | ICD-10-CM

## 2013-01-23 ENCOUNTER — Encounter: Payer: Self-pay | Admitting: Internal Medicine

## 2013-01-23 NOTE — Assessment & Plan Note (Signed)
He has suspicious s/s and decreased pulses I have ordered a arterial flow study of his LE's Will advise further if needed

## 2013-01-25 ENCOUNTER — Telehealth: Payer: Self-pay

## 2013-01-25 NOTE — Telephone Encounter (Signed)
Patient called Connor Miller stating that he was seen 01/20/13 and MD advised that he would be contacted regarding some testing. Pt calling to check appointment information regarding doppler. Pt can be contacted at [phone] Thanks

## 2013-01-27 ENCOUNTER — Encounter (INDEPENDENT_AMBULATORY_CARE_PROVIDER_SITE_OTHER): Payer: 59

## 2013-01-27 DIAGNOSIS — I70219 Atherosclerosis of native arteries of extremities with intermittent claudication, unspecified extremity: Secondary | ICD-10-CM

## 2013-02-08 ENCOUNTER — Ambulatory Visit (INDEPENDENT_AMBULATORY_CARE_PROVIDER_SITE_OTHER): Payer: 59

## 2013-02-08 DIAGNOSIS — D518 Other vitamin B12 deficiency anemias: Secondary | ICD-10-CM

## 2013-02-08 MED ORDER — CYANOCOBALAMIN 1000 MCG/ML IJ SOLN
1000.0000 ug | Freq: Once | INTRAMUSCULAR | Status: AC
  Administered 2013-02-08: 1000 ug via INTRAMUSCULAR

## 2013-03-10 ENCOUNTER — Ambulatory Visit (INDEPENDENT_AMBULATORY_CARE_PROVIDER_SITE_OTHER): Payer: 59 | Admitting: *Deleted

## 2013-03-10 DIAGNOSIS — D518 Other vitamin B12 deficiency anemias: Secondary | ICD-10-CM

## 2013-03-10 MED ORDER — CYANOCOBALAMIN 1000 MCG/ML IJ SOLN
1000.0000 ug | Freq: Once | INTRAMUSCULAR | Status: AC
  Administered 2013-03-10: 1000 ug via INTRAMUSCULAR

## 2013-03-24 ENCOUNTER — Ambulatory Visit (INDEPENDENT_AMBULATORY_CARE_PROVIDER_SITE_OTHER)
Admission: RE | Admit: 2013-03-24 | Discharge: 2013-03-24 | Disposition: A | Discharge status: Home / Self Care | Payer: 59 | Source: Ambulatory Visit | Attending: Internal Medicine | Admitting: Internal Medicine

## 2013-03-24 ENCOUNTER — Other Ambulatory Visit (INDEPENDENT_AMBULATORY_CARE_PROVIDER_SITE_OTHER): Payer: 59

## 2013-03-24 ENCOUNTER — Encounter: Payer: Self-pay | Admitting: Internal Medicine

## 2013-03-24 ENCOUNTER — Ambulatory Visit (INDEPENDENT_AMBULATORY_CARE_PROVIDER_SITE_OTHER): Payer: Medicare Other | Admitting: Internal Medicine

## 2013-03-24 VITALS — BP 110/74 | HR 64 | Temp 96.2°F | Resp 16 | Ht 70.0 in | Wt 132.0 lb

## 2013-03-24 DIAGNOSIS — S4992XA Unspecified injury of left shoulder and upper arm, initial encounter: Secondary | ICD-10-CM

## 2013-03-24 DIAGNOSIS — E039 Hypothyroidism, unspecified: Secondary | ICD-10-CM

## 2013-03-24 DIAGNOSIS — Z23 Encounter for immunization: Secondary | ICD-10-CM

## 2013-03-24 DIAGNOSIS — S4980XA Other specified injuries of shoulder and upper arm, unspecified arm, initial encounter: Secondary | ICD-10-CM

## 2013-03-24 DIAGNOSIS — S0990XA Unspecified injury of head, initial encounter: Secondary | ICD-10-CM | POA: Insufficient documentation

## 2013-03-24 LAB — LIPID PANEL
HDL: 61.4 mg/dL (ref >39.00)
LDL Cholesterol: 86 mg/dL (ref 0–99)
Total CHOL/HDL Ratio: 3
Triglycerides: 48 mg/dL (ref 0.0–149.0)
VLDL: 9.6 mg/dL (ref 0.0–40.0)

## 2013-03-24 NOTE — Progress Notes (Signed)
Subjective:    Patient ID: Connor Miller, male    DOB: 08/28/1922, 79 y.o.   MRN: [id number]  HPI Comments: He got up quickly 4 days ago and felt dizzy and fell to the ground. He hit his left forehead and there was bleeding. His wife thinks he has been confused since the fall. He also hit his left upper arm and has pain in that area.  Head Injury  The incident occurred 3 to 5 days ago. The injury mechanism was a fall. He lost consciousness for a period of 1 to 5 minutes. The volume of blood lost was minimal. The pain is at a severity of 0/10. The patient is experiencing no pain. Associated symptoms include disorientation. Pertinent negatives include no blurred vision, headaches, memory loss, numbness, tinnitus, vomiting or weakness. He has tried nothing for the symptoms.      Review of Systems  Constitutional: Negative for fever, chills, diaphoresis, activity change, appetite change, fatigue and unexpected weight change.  HENT: Negative.  Negative for tinnitus.   Eyes: Negative.  Negative for blurred vision.  Respiratory: Negative.  Negative for cough, choking, chest tightness, shortness of breath and wheezing.   Cardiovascular: Negative.  Negative for chest pain, palpitations and leg swelling.  Gastrointestinal: Negative.  Negative for vomiting, abdominal pain, diarrhea, constipation and blood in stool.  Endocrine: Negative.   Genitourinary: Negative.   Musculoskeletal: Positive for arthralgias (left humerus). Negative for back pain, gait problem, joint swelling, myalgias, neck pain and neck stiffness.  Skin: Negative.  Negative for color change, pallor, rash and wound.  Allergic/Immunologic: Negative.   Neurological: Positive for dizziness and syncope. Negative for weakness, numbness and headaches.  Hematological: Negative.  Negative for adenopathy.  Psychiatric/Behavioral: Negative.  Negative for memory loss.       Objective:   Physical Exam  Vitals reviewed. Constitutional: He  is oriented to person, place, and time. He appears well-developed and well-nourished.  Non-toxic appearance. He does not have a sickly appearance. He does not appear ill. No distress.  HENT:  Head: Normocephalic. Head is with abrasion. Head is without raccoon's eyes, without Battle's sign, without contusion, without laceration, without right periorbital erythema and without left periorbital erythema.    Right Ear: Hearing, tympanic membrane, external ear and ear canal normal. No hemotympanum.  Left Ear: Hearing, tympanic membrane, external ear and ear canal normal. No hemotympanum.  Nose: No mucosal edema, rhinorrhea, nose lacerations, sinus tenderness, nasal deformity, septal deviation or nasal septal hematoma. No epistaxis.  No foreign bodies. Right sinus exhibits no maxillary sinus tenderness and no frontal sinus tenderness. Left sinus exhibits no maxillary sinus tenderness and no frontal sinus tenderness.  Mouth/Throat: Oropharynx is clear and moist and mucous membranes are normal. Mucous membranes are not pale, not dry and not cyanotic. No oropharyngeal exudate, posterior oropharyngeal edema, posterior oropharyngeal erythema or tonsillar abscesses.  Eyes: Conjunctivae and EOM are normal. Pupils are equal, round, and reactive to light. Right eye exhibits no discharge. Left eye exhibits no discharge. No scleral icterus.  Neck: Normal range of motion. Neck supple. No JVD present. No tracheal deviation present. No thyromegaly present.  Cardiovascular: Normal rate, regular rhythm, normal heart sounds and intact distal pulses.  Exam reveals no gallop and no friction rub.   No murmur heard. Pulmonary/Chest: Effort normal and breath sounds normal. No stridor. No respiratory distress. He has no wheezes. He has no rales. He exhibits no tenderness.  Abdominal: Soft. Bowel sounds are normal. He exhibits no distension and no  mass. There is no tenderness. There is no rebound and no guarding.  Musculoskeletal:  Normal range of motion. He exhibits no edema and no tenderness.       Left shoulder: Normal.       Left elbow: Normal.       Left upper arm: Normal. He exhibits no tenderness, no bony tenderness, no swelling, no edema, no deformity and no laceration.  Lymphadenopathy:    He has no cervical adenopathy.  Neurological: He is alert and oriented to person, place, and time. He has normal reflexes. He displays normal reflexes. No cranial nerve deficit. He exhibits normal muscle tone. Coordination normal.  Skin: Skin is warm and dry. No rash noted. He is not diaphoretic. No erythema. No pallor.  Psychiatric: He has a normal mood and affect. His behavior is normal. Judgment and thought content normal.     Lab Results  Component Value Date   WBC 4.4* 12/03/2012   HGB 12.1* 12/03/2012   HCT 35.5* 12/03/2012   PLT 203.0 12/03/2012   GLUCOSE 90 12/03/2012   CHOL 152 11/20/2011   TRIG 45.0 11/20/2011   HDL 68.50 11/20/2011   LDLCALC 75 11/20/2011   ALT 17 11/20/2011   AST 17 11/20/2011   NA 137 12/03/2012   K 4.1 12/03/2012   CL 102 12/03/2012   CREATININE 1.0 12/03/2012   BUN 15 12/03/2012   CO2 33* 12/03/2012   TSH 0.99 12/03/2012   PSA 2.32 05/07/2011   HGBA1C 5.5 05/07/2011       Assessment & Plan:

## 2013-03-24 NOTE — Patient Instructions (Signed)
Head Injury, Adult °You have had a head injury that does not appear serious at this time. A concussion is a state of changed mental ability, usually from a blow to the head. You should take clear liquids for the rest of the day and then resume your regular diet. You should not take sedatives or alcoholic beverages for as long as directed by your caregiver after discharge. After injuries such as yours, most problems occur within the first 24 hours. °SYMPTOMS °These minor symptoms may be experienced after discharge: °· Memory difficulties. °· Dizziness. °· Headaches. °· Double vision. °· Hearing difficulties. °· Depression. °· Tiredness. °· Weakness. °· Difficulty with concentration. °If you experience any of these problems, you should not be alarmed. A concussion requires a few days for recovery. Many patients with head injuries frequently experience such symptoms. Usually, these problems disappear without medical care. If symptoms last for more than one day, notify your caregiver. See your caregiver sooner if symptoms are becoming worse rather than better. °HOME CARE INSTRUCTIONS  °· During the next 24 hours you must stay with someone who can watch you for the warning signs listed below. °Although it is unlikely that serious side effects will occur, you should be aware of signs and symptoms which may necessitate your return to this location. Side effects may occur up to 7  10 days following the injury. It is important for you to carefully monitor your condition and contact your caregiver or seek immediate medical attention if there is a change in your condition. °SEEK IMMEDIATE MEDICAL CARE IF:  °· There is confusion or drowsiness. °· You can not awaken the injured person. °· There is nausea (feeling sick to your stomach) or continued, forceful vomiting. °· You notice dizziness or unsteadiness which is getting worse, or inability to walk. °· You have convulsions or unconsciousness. °· You experience severe,  persistent headaches not relieved by over-the-counter or prescription medicines for pain. (Do not take aspirin as this impairs clotting abilities). Take other pain medications only as directed. °· You can not use arms or legs normally. °· There is clear or bloody discharge from the nose or ears. °MAKE SURE YOU:  °· Understand these instructions. °· Will watch your condition. °· Will get help right away if you are not doing well or get worse. °Document Released: 05/26/2005 Document Revised: 08/18/2011 Document Reviewed: 04/13/2009 °[employer]® Patient Information ©2014 [employer]. ° °

## 2013-03-25 ENCOUNTER — Telehealth: Payer: Self-pay

## 2013-03-25 NOTE — Telephone Encounter (Signed)
Pt notified of CT results. 

## 2013-03-27 NOTE — Assessment & Plan Note (Signed)
The plain film is normal This appears to be a contusion or MS strain

## 2013-03-27 NOTE — Assessment & Plan Note (Signed)
His TSH is in the normal range 

## 2013-03-27 NOTE — Assessment & Plan Note (Signed)
Plain film is normal He appears at his baseline to me today

## 2013-03-31 ENCOUNTER — Encounter: Payer: Self-pay | Admitting: Internal Medicine

## 2013-03-31 ENCOUNTER — Other Ambulatory Visit (INDEPENDENT_AMBULATORY_CARE_PROVIDER_SITE_OTHER): Payer: 59

## 2013-03-31 ENCOUNTER — Ambulatory Visit (INDEPENDENT_AMBULATORY_CARE_PROVIDER_SITE_OTHER): Payer: Medicare Other | Admitting: Family Medicine

## 2013-03-31 ENCOUNTER — Encounter: Payer: Self-pay | Admitting: Family Medicine

## 2013-03-31 ENCOUNTER — Ambulatory Visit (INDEPENDENT_AMBULATORY_CARE_PROVIDER_SITE_OTHER): Payer: 59 | Admitting: Internal Medicine

## 2013-03-31 VITALS — BP 118/80 | HR 68 | Temp 98.1°F | Resp 16 | Ht 70.0 in | Wt 131.0 lb

## 2013-03-31 VITALS — BP 118/80 | HR 68 | Wt 131.0 lb

## 2013-03-31 DIAGNOSIS — M25519 Pain in unspecified shoulder: Secondary | ICD-10-CM

## 2013-03-31 DIAGNOSIS — M75122 Complete rotator cuff tear or rupture of left shoulder, not specified as traumatic: Secondary | ICD-10-CM

## 2013-03-31 DIAGNOSIS — M25512 Pain in left shoulder: Secondary | ICD-10-CM

## 2013-03-31 DIAGNOSIS — I10 Essential (primary) hypertension: Secondary | ICD-10-CM

## 2013-03-31 DIAGNOSIS — M7500 Adhesive capsulitis of unspecified shoulder: Secondary | ICD-10-CM

## 2013-03-31 DIAGNOSIS — M7502 Adhesive capsulitis of left shoulder: Secondary | ICD-10-CM

## 2013-03-31 DIAGNOSIS — M75120 Complete rotator cuff tear or rupture of unspecified shoulder, not specified as traumatic: Secondary | ICD-10-CM

## 2013-03-31 HISTORY — DX: Complete rotator cuff tear or rupture of left shoulder, not specified as traumatic: M75.122

## 2013-03-31 NOTE — Patient Instructions (Signed)
Adhesive Capsulitis Sometimes the shoulder becomes stiff and is painful to move. Some people say it feels as if the shoulder is frozen in place. Because of this, the condition is called "frozen shoulder." Its medical name is adhesive capsulitis.  The shoulder joint is made up of strong connective tissue that attaches the ball of the humerus to the shallow shoulder socket. This strong connective tissue is called the joint capsule. This tissue can become stiff and swollen. That is when adhesive capsulitis sets in. CAUSES  It is not always clear just what the cause adhesive capsulitis. Possibilities include:  Injury to the shoulder joint.  Strain. This is a repetitive injury brought about by overuse.  Lack of use. Perhaps your arm or hand was otherwise injured. It might have been in a sling for awhile. Or perhaps you were not using it to avoid pain.  Referred pain. This is a sort of trick the body plays. You feel pain in the shoulder. But, the pain actually comes from an injury somewhere else in the body.  Long-standing health problems. Several diseases can cause adhesive capsulitis. They include diabetes, heart disease, stroke, thyroid problems, rheumatoid arthritis and lung disease.  Being a women older than 40. Anyone can develop adhesive capsulitis but it is most common in women in this age group. SYMPTOMS   Pain.  It occurs when the arm is moved.  Parts of the shoulder might hurt if they are touched.  Pain is worse at night or when resting.  Soreness. It might not be strong enough to be called pain. But, the shoulder aches.  The shoulder does not move freely.  Muscle spasms.  Trouble sleeping because of shoulder ache or pain. DIAGNOSIS  To decide if you have adhesive capsulitis, your healthcare provider will probably:  Ask about symptoms you have noticed.  Ask about your history of joint pain and anything that might have caused the pain.  Ask about your overall  health.  Use hands to feel your shoulder and neck.  Ask you to move your shoulder in specific directions. This may indicate the origin of the pain.  Order imaging tests; pictures of the shoulder. They help pinpoint the source of the problem. An X-ray might be used. For more detail, an MRI is often used. An MRI details the tendons, muscles and ligaments as well as the joint. TREATMENT  Adhesive capsulitis can be treated several ways. Most treatments can be done in a clinic or in your healthcare provider's office. Be sure to discuss the different options with your caregiver. They include:  Physical therapy. You will work on specific exercises to get your shoulder moving again. The exercises usually involve stretching. A physical therapist (a caregiver with special training) can show you what to do and what not to do. The exercises will need to be done daily.  Medication.  Over-the-counter medicines may relieve pain and inflammation (the body's way of reacting to injury or infection).  Corticosteroids. These are stronger drugs to reduce pain and inflammation. They are given by injection (shots) into the shoulder joint. Frequent treatment is not recommended.  Muscle relaxants. Medication may be prescribed to ease muscle spasms.  Treatment of underlying conditions. This means treating another condition that is causing your shoulder problem. This might be a rotator cuff (tendon) problem  Shoulder manipulation. The shoulder will be moved by your healthcare provider. You would be under general anesthesia (given a drug that puts you to sleep). You would not feel anything. Sometimes   the joint will be injected with salt water (saline) at high pressure to break down internal scarring in the joint capsule.  Surgery. This is rarely needed. It may be suggested in advanced cases after all other treatment has failed. PROGNOSIS  In time, most people recover from adhesive capsulitis. Sometimes, however, the  pain goes away but full movement of the shoulder does not return.  HOME CARE INSTRUCTIONS   Take any pain medications recommended by your healthcare provider. Follow the directions carefully.  If you have physical therapy, follow through with the therapist's suggestions. Be sure you understand the exercises you will be doing. You should understand:  How often the exercises should be done.  How many times each exercise should be repeated.  How long they should be done.  What other activities you should do, or not do.  That you should warm up before doing any exercise. Just 5 to 10 minutes will help. Small, gentle movements should get your shoulder ready for more.  Avoid high-demand exercise that involves your shoulder such as throwing. This type of exercise can make pain worse.  Consider using cold packs. Cold may ease swelling and pain. Ask your healthcare provider if a cold pack might help you. If so, get directions on how and when to use them. SEEK MEDICAL CARE IF:   You have any questions about your medications.  Your pain continues to increase. Document Released: 03/23/2009 Document Revised: 08/18/2011 Document Reviewed: 03/23/2009 Exit[employer] Patient Information 2014 [employer].  

## 2013-03-31 NOTE — Assessment & Plan Note (Signed)
Discussed prognosis, diagnosis, and treatment options. Patient is an 79 year old gentleman but is relatively active. Patient before this traumatic injury was doing a significant amount of physical labor. This injury has changed his quality of life including his activities of daily living. We discussed potential conservative approach with physical therapy, corticosteroid injections versus more aggressive therapy with potential surgical intervention. I did discuss with him that due to his age and likely some arthritic changes there is a possibility that the rotator cuff repair may be inoperable. There is also a chance to that he could go with a shoulder replacement surgery. After a long discussion I would like patient to be further evaluated by Dr. Ave Filter at [facility]. They can discuss the potential possibility for surgical intervention if the patient is a candidate. Patient understands if he has any questions or like to follow up with me after the appointment he is more than welcome to do so. Patient will continue with over-the-counter anti-inflammatories as needed.

## 2013-03-31 NOTE — Progress Notes (Signed)
  Subjective:    Patient ID: Connor Miller, male    DOB: 09/22/1922, 79 y.o.   MRN: [id number]  Shoulder Pain  The pain is present in the left shoulder. This is a recurrent problem. The current episode started in the past 7 days. The problem occurs constantly. The problem has been gradually worsening. The quality of the pain is described as aching. The pain is at a severity of 2/10. The pain is mild. Associated symptoms include joint swelling and a limited range of motion. Pertinent negatives include no fever, inability to bear weight, itching, numbness, stiffness or tingling. He has tried nothing for the symptoms. The treatment provided no relief.      Review of Systems  Constitutional: Negative.  Negative for fever, chills, diaphoresis, appetite change and fatigue.  HENT: Negative.   Eyes: Negative.   Respiratory: Negative.  Negative for cough, chest tightness, shortness of breath, wheezing and stridor.   Cardiovascular: Negative.  Negative for chest pain, palpitations and leg swelling.  Gastrointestinal: Negative.  Negative for nausea, vomiting, abdominal pain, diarrhea, constipation and blood in stool.  Endocrine: Negative.   Genitourinary: Negative.   Musculoskeletal: Positive for arthralgias. Negative for back pain, gait problem, joint swelling, myalgias, neck pain and stiffness.  Skin: Negative.  Negative for itching.  Allergic/Immunologic: Negative.   Neurological: Negative.  Negative for dizziness, tingling, tremors, seizures, weakness, light-headedness and numbness.  Hematological: Negative.  Negative for adenopathy. Does not bruise/bleed easily.  Psychiatric/Behavioral: Negative.        Objective:   Physical Exam  Vitals reviewed. Constitutional: He is oriented to person, place, and time. He appears well-developed and well-nourished. No distress.  HENT:  Head: Normocephalic and atraumatic.  Mouth/Throat: Oropharynx is clear and moist. No oropharyngeal exudate.  Eyes:  Conjunctivae are normal. Right eye exhibits no discharge. Left eye exhibits no discharge. No scleral icterus.  Neck: Normal range of motion. Neck supple. No JVD present. No tracheal deviation present. No thyromegaly present.  Cardiovascular: Normal rate, normal heart sounds and intact distal pulses.  Exam reveals no gallop.   No murmur heard. Pulmonary/Chest: Effort normal and breath sounds normal. No stridor. No respiratory distress. He has no wheezes. He has no rales. He exhibits no tenderness.  Abdominal: Soft. Bowel sounds are normal. He exhibits no distension and no mass. There is no tenderness. There is no rebound and no guarding.  Musculoskeletal: He exhibits no edema and no tenderness.       Left shoulder: He exhibits decreased range of motion, bony tenderness, swelling and deformity. He exhibits no effusion, no crepitus, no laceration, no pain, no spasm, normal pulse and normal strength.  Lymphadenopathy:    He has no cervical adenopathy.  Neurological: He is oriented to person, place, and time.  Skin: Skin is warm and dry. No rash noted. He is not diaphoretic. No erythema. No pallor.  Psychiatric: He has a normal mood and affect. His behavior is normal. Judgment and thought content normal.          Assessment & Plan:

## 2013-03-31 NOTE — Progress Notes (Signed)
I'm seeing this patient by the request  of:  Sanda Linger, MD  CC: Left shoulder pain and weakness  HPI: Patient is a very pleasant 79 year old right-hand-dominant male coming in with left shoulder pain and weakness. This occurred 10 days ago after a fall. Patient did fall and unfortunately had an abrasion on his head. Patient was told that he did have a bruise of his shoulder but unfortunately since that time he has not regained any a function of this left shoulder. Patient states it is significantly hard to lift the arm and has pain with certain movements. Patient is also having pain at night. He describes the pain as a total aching sensation that can have sharp pain with certain movements. Denies any radiation of the arm and denies any numbness. Patient also denies any neck pain. Before the injury patient was self-sufficient and does do all activities around his house and helps as primary caregiver for his wife. Patient was the severity of 710. This is affecting activities of daily living such as dressing himself at this time. Nothing seems to relieve the pain the  Past medical, surgical, family and social history reviewed. Medications reviewed all in the electronic medical record.   Review of Systems: No headache, visual changes, nausea, vomiting, diarrhea, constipation, dizziness, abdominal pain, skin rash, fevers, chills, night sweats, weight loss, swollen lymph nodes, body aches, joint swelling, muscle aches, chest pain, shortness of breath, mood changes.   Objective:    Blood pressure 118/80, pulse 68, weight 131 lb (59.421 kg), SpO2 95.00%.   General: No apparent distress alert and oriented x3 mood and affect normal, dressed appropriately.  HEENT: Pupils equal, extraocular movements intact Respiratory: Patient's speak in full sentences and does not appear short of breath Cardiovascular: No lower extremity edema, non tender, no erythema Skin: Warm dry intact with no signs of infection or  rash on extremities or on axial skeleton. Abdomen: Soft nontender Neuro: Cranial nerves II through XII are intact, neurovascularly intact in all extremities with 2+ DTRs and 2+ pulses. Lymph: No lymphadenopathy of posterior or anterior cervical chain or axillae bilaterally.  Gait normal with good balance and coordination.  MSK: Non tender with full range of motion and good stability and symmetric strength and tone of elbows, wrist, hip, knee and ankles bilaterally.  Shoulder: Left shoulder Inspection revealed that it seems to be held closer to his body the contralateral side. Patient does have some muscle wasting in the trapezius bilaterally.. Palpation is normal with no tenderness over AC joint or bicipital groove. Active range of motion shows patient has forward flexion to 65, external rotation to 10, internal rotation to sacrum. Patient does have active range of motion it seems to be fully with some crepitus. Contralateral side has full range of motion Rotator cuff strength is 2/5 on the left arm with a positive empty can sign and drop arm test as well as painful arc.. Patient has full strength of the contralateral side. Speeds and Yergason's tests normal.  MSK US performed of: Left short This study was ordered, performed, and interpreted by Terrilee Files D.O.  Shoulder:   Supraspinatus:  Has significant hypoechoic changes right at its insertion on the humeral head. It appears that there is a full-thickness tear with 1.01 cm of retraction.  Infraspinatus:  Appears normal on long and transverse views. Mildly atrophied Subscapularis:  Patient does have hypoechoic changes in this area that does appear to be fairly acute. Patient does have a full-thickness tear with retraction  of 1.63 cm. There is arthritic changes of the humeral head noted. Teres Minor:  Appears normal on long and transverse views. AC joint:  Moderate osteoarthritic changes Glenohumeral Joint:  Appears normal with mild effusion  and Oster arthritic changes. Glenoid Labrum:  Intact without visualized tears. Biceps Tendon:  Appears normal on long and transverse views, no fraying of tendon, tendon located in intertubercular groove, no subluxation with shoulder internal or external rotation. No increased power doppler signal.   Impression: Full-thickness tear of the supraspinatus and subscapularis with approximately 1 cm of retraction.    Impression and Recommendations:     This case required medical decision making of moderate complexity.

## 2013-03-31 NOTE — Patient Instructions (Signed)
Nice to meet you You do have a rotator cuff tear.  See Dr. Ave Filter at 845am at [facility] orthopaedics. [address] [facility] There number [phone] If you are doing conservative therapy come back and see me.

## 2013-03-31 NOTE — Assessment & Plan Note (Signed)
His BP is well controlled 

## 2013-03-31 NOTE — Assessment & Plan Note (Signed)
Ortho referral  

## 2013-04-06 ENCOUNTER — Ambulatory Visit: Payer: 59 | Admitting: Family Medicine

## 2013-04-11 ENCOUNTER — Ambulatory Visit (INDEPENDENT_AMBULATORY_CARE_PROVIDER_SITE_OTHER): Payer: 59

## 2013-04-11 DIAGNOSIS — D518 Other vitamin B12 deficiency anemias: Secondary | ICD-10-CM

## 2013-04-11 MED ORDER — CYANOCOBALAMIN 1000 MCG/ML IJ SOLN
1000.0000 ug | Freq: Once | INTRAMUSCULAR | Status: AC
  Administered 2013-04-11: 1000 ug via INTRAMUSCULAR

## 2013-05-10 ENCOUNTER — Ambulatory Visit (INDEPENDENT_AMBULATORY_CARE_PROVIDER_SITE_OTHER): Payer: 59 | Admitting: *Deleted

## 2013-05-10 DIAGNOSIS — D518 Other vitamin B12 deficiency anemias: Secondary | ICD-10-CM

## 2013-05-10 MED ORDER — CYANOCOBALAMIN 1000 MCG/ML IJ SOLN
1000.0000 ug | Freq: Once | INTRAMUSCULAR | Status: AC
  Administered 2013-05-10: 1000 ug via INTRAMUSCULAR

## 2013-05-23 ENCOUNTER — Other Ambulatory Visit: Payer: Self-pay | Admitting: Dermatology

## 2013-06-06 ENCOUNTER — Other Ambulatory Visit (INDEPENDENT_AMBULATORY_CARE_PROVIDER_SITE_OTHER): Payer: 59

## 2013-06-06 ENCOUNTER — Telehealth: Payer: Self-pay | Admitting: *Deleted

## 2013-06-06 ENCOUNTER — Ambulatory Visit (INDEPENDENT_AMBULATORY_CARE_PROVIDER_SITE_OTHER): Payer: 59 | Admitting: Internal Medicine

## 2013-06-06 ENCOUNTER — Encounter: Payer: Self-pay | Admitting: Internal Medicine

## 2013-06-06 VITALS — BP 120/75 | HR 67 | Temp 97.8°F | Resp 16 | Wt 137.0 lb

## 2013-06-06 DIAGNOSIS — D518 Other vitamin B12 deficiency anemias: Secondary | ICD-10-CM

## 2013-06-06 DIAGNOSIS — E039 Hypothyroidism, unspecified: Secondary | ICD-10-CM

## 2013-06-06 DIAGNOSIS — I10 Essential (primary) hypertension: Secondary | ICD-10-CM

## 2013-06-06 LAB — CBC WITH DIFFERENTIAL/PLATELET
Basophils Relative: 0.3 % (ref 0.0–3.0)
Eosinophils Absolute: 0.1 10*3/uL (ref 0.0–0.7)
Eosinophils Relative: 1.5 % (ref 0.0–5.0)
HCT: 36.2 % — ABNORMAL LOW (ref 39.0–52.0)
Hemoglobin: 12.3 g/dL — ABNORMAL LOW (ref 13.0–17.0)
Lymphs Abs: 1.6 10*3/uL (ref 0.7–4.0)
MCHC: 34.1 g/dL (ref 30.0–36.0)
MCV: 103 fl — ABNORMAL HIGH (ref 78.0–100.0)
Monocytes Absolute: 0.8 10*3/uL (ref 0.1–1.0)
Monocytes Relative: 13.6 % — ABNORMAL HIGH (ref 3.0–12.0)
Neutro Abs: 3 10*3/uL (ref 1.4–7.7)
Neutrophils Relative %: 54.8 % (ref 43.0–77.0)
Platelets: 212 10*3/uL (ref 150.0–400.0)
RBC: 3.51 Mil/uL — ABNORMAL LOW (ref 4.22–5.81)
WBC: 5.5 10*3/uL (ref 4.5–10.5)

## 2013-06-06 MED ORDER — CYANOCOBALAMIN 1000 MCG/ML IJ SOLN: 1000.0000 ug | Freq: Once | INTRAMUSCULAR | Status: DC

## 2013-06-06 MED ORDER — CYANOCOBALAMIN 500 MCG/0.1ML NA SOLN: 0.1000 mL | NASAL | Status: DC

## 2013-06-06 NOTE — Progress Notes (Signed)
Pre visit review using our clinic review tool, if applicable. No additional management support is needed unless otherwise documented below in the visit note. 

## 2013-06-06 NOTE — Telephone Encounter (Signed)
[facility] Triage Call Report Triage Record Num: [id number] Operator: Albertine Grates Patient Name: Connor Miller Call Date & Time: 06/03/2013 6:31:43PM Patient Phone: [phone] PCP: [place] Patient Gender: Male PCP Fax : [phone] Patient DOB: Sep 12, 1922 Practice Name: [facility] Reason for Call: Caller: Powell/Patient; PCP: Sanda Linger (Adults only); CB#: [phone]; Larey Seat 12-26. Has abrasion on head and arm. States looks like "scrapes". Denies lumps/pain. Is moving everything as usual. Home care advice given per Abrasion protocol. Protocol(s) Used: Abrasions, Lacerations, Puncture Wounds Recommended Outcome per Protocol: Provide Home/Self Care Reason for Outcome: Wound does not gape with or without pulling, is superficial or with minor bleeding that is easily controlled with pressure Care Advice: Call provider if wound or area around wound becomes increasingly red or painful, there is a purulent or foul smelling discharge, red streaks develop leading away from wound, or if you develop any temperature elevation. ~ Cleanse with water. Cover with a bandage across the cut to keep edges in contact. Apply direct pressure to the wound to control bleeding. ~ 12/

## 2013-06-06 NOTE — Progress Notes (Signed)
   Subjective:    Patient ID: Connor Miller, male    DOB: 06-02-1923, 79 y.o.   MRN: [id number]  Thyroid Problem Presents for follow-up visit. Symptoms include fatigue. Patient reports no anxiety, cold intolerance, constipation, depressed mood, diaphoresis, diarrhea, dry skin, hair loss, heat intolerance, hoarse voice, leg swelling, nail problem, palpitations, tremors, visual change, weight gain or weight loss. The symptoms have been stable. Past treatments include levothyroxine. The treatment provided significant relief. There are no known risk factors.      Review of Systems  Constitutional: Positive for fatigue. Negative for fever, chills, weight loss, weight gain, diaphoresis, appetite change and unexpected weight change.  HENT: Negative.  Negative for hoarse voice.   Eyes: Negative.   Respiratory: Negative.  Negative for cough, chest tightness, shortness of breath, wheezing and stridor.   Cardiovascular: Negative.  Negative for chest pain, palpitations and leg swelling.  Gastrointestinal: Negative.  Negative for abdominal pain, diarrhea and constipation.  Endocrine: Negative.  Negative for cold intolerance and heat intolerance.  Genitourinary: Negative.   Musculoskeletal: Negative.   Skin: Negative.   Allergic/Immunologic: Negative.   Neurological: Negative.  Negative for tremors.  Hematological: Negative.  Negative for adenopathy. Does not bruise/bleed easily.  Psychiatric/Behavioral: Negative.        Objective:   Physical Exam  Vitals reviewed. Constitutional: He is oriented to person, place, and time. He appears well-developed and well-nourished. No distress.  HENT:  Head: Normocephalic and atraumatic.  Mouth/Throat: Oropharynx is clear and moist. No oropharyngeal exudate.  Eyes: Conjunctivae are normal. Right eye exhibits no discharge. Left eye exhibits no discharge. No scleral icterus.  Neck: Normal range of motion. Neck supple. No JVD present. No tracheal deviation  present. No thyromegaly present.  Cardiovascular: Normal rate, regular rhythm, normal heart sounds and intact distal pulses.  Exam reveals no gallop and no friction rub.   No murmur heard. Pulmonary/Chest: Effort normal and breath sounds normal. No stridor. No respiratory distress. He has no wheezes. He has no rales. He exhibits no tenderness.  Abdominal: Soft. Bowel sounds are normal. He exhibits no distension and no mass. There is no tenderness. There is no rebound and no guarding.  Musculoskeletal: Normal range of motion. He exhibits no edema and no tenderness.  Lymphadenopathy:    He has no cervical adenopathy.  Neurological: He is oriented to person, place, and time.  Skin: Skin is warm and dry. No rash noted. He is not diaphoretic. No erythema. No pallor.  Psychiatric: He has a normal mood and affect. His behavior is normal. Judgment and thought content normal.     Lab Results  Component Value Date   WBC 4.4* 12/03/2012   HGB 12.1* 12/03/2012   HCT 35.5* 12/03/2012   PLT 203.0 12/03/2012   GLUCOSE 90 12/03/2012   CHOL 157 03/24/2013   TRIG 48.0 03/24/2013   HDL 61.40 03/24/2013   LDLCALC 86 03/24/2013   ALT 17 11/20/2011   AST 17 11/20/2011   NA 137 12/03/2012   K 4.1 12/03/2012   CL 102 12/03/2012   CREATININE 1.0 12/03/2012   BUN 15 12/03/2012   CO2 33* 12/03/2012   TSH 3.39 03/24/2013   PSA 2.32 05/07/2011   HGBA1C 5.5 05/07/2011       Assessment & Plan:

## 2013-06-06 NOTE — Patient Instructions (Signed)

## 2013-06-07 ENCOUNTER — Encounter: Payer: Self-pay | Admitting: Internal Medicine

## 2013-06-07 LAB — BASIC METABOLIC PANEL
BUN: 14 mg/dL (ref 6–23)
CO2: 31 mEq/L (ref 19–32)
Calcium: 9 mg/dL (ref 8.4–10.5)
Chloride: 99 mEq/L (ref 96–112)
Creatinine, Ser: 1.1 mg/dL (ref 0.4–1.5)
Glucose, Bld: 79 mg/dL (ref 70–99)

## 2013-06-07 NOTE — Assessment & Plan Note (Signed)
His BP is well controlled His lytes and renal function are stable 

## 2013-06-07 NOTE — Assessment & Plan Note (Signed)
This is stable 

## 2013-06-07 NOTE — Assessment & Plan Note (Signed)
His TSH is in the normal range - no dose change needed

## 2013-06-20 ENCOUNTER — Telehealth: Payer: Self-pay | Admitting: *Deleted

## 2013-06-20 NOTE — Telephone Encounter (Signed)
Pt phoned inquiring about his B12.  Attempted to return call with no answer and no answering machine/voicemail.

## 2013-07-01 ENCOUNTER — Encounter: Payer: Self-pay | Admitting: Internal Medicine

## 2013-07-01 ENCOUNTER — Ambulatory Visit (INDEPENDENT_AMBULATORY_CARE_PROVIDER_SITE_OTHER): Payer: 59 | Admitting: Internal Medicine

## 2013-07-01 VITALS — BP 120/82 | HR 69 | Temp 97.5°F | Resp 16 | Ht 70.0 in | Wt 137.2 lb

## 2013-07-01 DIAGNOSIS — D518 Other vitamin B12 deficiency anemias: Secondary | ICD-10-CM

## 2013-07-01 DIAGNOSIS — I10 Essential (primary) hypertension: Secondary | ICD-10-CM

## 2013-07-01 DIAGNOSIS — E039 Hypothyroidism, unspecified: Secondary | ICD-10-CM

## 2013-07-01 NOTE — Patient Instructions (Signed)

## 2013-07-01 NOTE — Assessment & Plan Note (Signed)
He will cont nascobal ns

## 2013-07-01 NOTE — Assessment & Plan Note (Signed)
His recent TSH was in the normal range, he will stay on the current dose

## 2013-07-01 NOTE — Progress Notes (Signed)
Pre visit review using our clinic review tool, if applicable. No additional management support is needed unless otherwise documented below in the visit note. 

## 2013-07-01 NOTE — Progress Notes (Signed)
   Subjective:    Patient ID: Connor Miller, male    DOB: 12-10-1922, 80 y.o.   MRN: [id number]  Thyroid Problem Presents for follow-up visit. Symptoms include fatigue. Patient reports no anxiety, cold intolerance, constipation, depressed mood, diaphoresis, diarrhea, dry skin, hair loss, heat intolerance, hoarse voice, leg swelling, nail problem, palpitations, tremors, visual change, weight gain or weight loss. Past treatments include levothyroxine. The treatment provided moderate relief.      Review of Systems  Constitutional: Positive for fatigue. Negative for weight loss, weight gain and diaphoresis.  HENT: Negative for hoarse voice.   Cardiovascular: Negative for palpitations.  Gastrointestinal: Negative for diarrhea and constipation.  Endocrine: Negative for cold intolerance and heat intolerance.  Neurological: Negative for tremors.  All other systems reviewed and are negative.       Objective:   Physical Exam  Vitals reviewed. Constitutional: He is oriented to person, place, and time. He appears well-developed and well-nourished. No distress.  HENT:  Head: Normocephalic and atraumatic.  Mouth/Throat: Oropharynx is clear and moist. No oropharyngeal exudate.  Eyes: Conjunctivae are normal. Right eye exhibits no discharge. Left eye exhibits no discharge. No scleral icterus.  Neck: Normal range of motion. Neck supple. No JVD present. No tracheal deviation present. No thyromegaly present.  Cardiovascular: Normal rate, regular rhythm, normal heart sounds and intact distal pulses.  Exam reveals no gallop and no friction rub.   No murmur heard. Pulmonary/Chest: Effort normal and breath sounds normal. No stridor. No respiratory distress. He has no wheezes. He has no rales. He exhibits no tenderness.  Abdominal: Soft. Bowel sounds are normal. He exhibits no distension and no mass. There is no tenderness. There is no rebound and no guarding.  Musculoskeletal: Normal range of motion. He  exhibits no edema and no tenderness.  Lymphadenopathy:    He has no cervical adenopathy.  Neurological: He is oriented to person, place, and time.  Skin: Skin is warm and dry. No rash noted. He is not diaphoretic. No erythema. No pallor.  Psychiatric: He has a normal mood and affect. His behavior is normal. Judgment and thought content normal.      Lab Results  Component Value Date   WBC 5.5 06/06/2013   HGB 12.3* 06/06/2013   HCT 36.2* 06/06/2013   PLT 212.0 06/06/2013   GLUCOSE 79 06/06/2013   CHOL 157 03/24/2013   TRIG 48.0 03/24/2013   HDL 61.40 03/24/2013   LDLCALC 86 03/24/2013   ALT 17 11/20/2011   AST 17 11/20/2011   NA 137 06/06/2013   K 4.1 06/06/2013   CL 99 06/06/2013   CREATININE 1.1 06/06/2013   BUN 14 06/06/2013   CO2 31 06/06/2013   TSH 4.35 06/06/2013   PSA 2.32 05/07/2011   HGBA1C 5.5 05/07/2011      Assessment & Plan:

## 2013-07-01 NOTE — Assessment & Plan Note (Signed)
His BP is well controlled 

## 2013-07-06 ENCOUNTER — Other Ambulatory Visit: Payer: Self-pay | Admitting: Internal Medicine

## 2013-07-11 ENCOUNTER — Ambulatory Visit: Payer: 59

## 2013-10-05 ENCOUNTER — Other Ambulatory Visit (INDEPENDENT_AMBULATORY_CARE_PROVIDER_SITE_OTHER): Payer: 59

## 2013-10-05 ENCOUNTER — Ambulatory Visit (INDEPENDENT_AMBULATORY_CARE_PROVIDER_SITE_OTHER): Payer: 59 | Admitting: Internal Medicine

## 2013-10-05 ENCOUNTER — Encounter: Payer: Self-pay | Admitting: Internal Medicine

## 2013-10-05 VITALS — BP 118/70 | HR 57 | Temp 97.9°F | Resp 16 | Ht 70.0 in | Wt 134.2 lb

## 2013-10-05 DIAGNOSIS — I10 Essential (primary) hypertension: Secondary | ICD-10-CM

## 2013-10-05 DIAGNOSIS — E039 Hypothyroidism, unspecified: Secondary | ICD-10-CM

## 2013-10-05 DIAGNOSIS — D518 Other vitamin B12 deficiency anemias: Secondary | ICD-10-CM

## 2013-10-05 LAB — BASIC METABOLIC PANEL
BUN: 14 mg/dL (ref 6–23)
CHLORIDE: 97 meq/L (ref 96–112)
CO2: 32 mEq/L (ref 19–32)
CREATININE: 1 mg/dL (ref 0.4–1.5)
Calcium: 9.4 mg/dL (ref 8.4–10.5)
GFR: 77.51 mL/min (ref >60.00)
Glucose, Bld: 78 mg/dL (ref 70–99)
Potassium: 4.2 mEq/L (ref 3.5–5.1)
Sodium: 134 mEq/L — ABNORMAL LOW (ref 135–145)

## 2013-10-05 LAB — TSH: TSH: 3.72 u[IU]/mL (ref 0.35–5.50)

## 2013-10-05 MED ORDER — CYANOCOBALAMIN 2000 MCG PO TABS: 2000.0000 ug | ORAL_TABLET | Freq: Every day | ORAL | Status: DC

## 2013-10-05 MED ORDER — CYANOCOBALAMIN 1000 MCG/ML IJ SOLN
1000.0000 ug | Freq: Once | INTRAMUSCULAR | Status: AC
  Administered 2013-10-05: 1000 ug via INTRAMUSCULAR

## 2013-10-05 NOTE — Progress Notes (Signed)
Pre visit review using our clinic review tool, if applicable. No additional management support is needed unless otherwise documented below in the visit note. 

## 2013-10-05 NOTE — Progress Notes (Signed)
   Subjective:    Patient ID: Connor Miller, male    DOB: Apr 21, 1923, 80 y.o.   MRN: [id number]  Thyroid Problem Presents for follow-up visit. Symptoms include fatigue. Patient reports no anxiety, cold intolerance, constipation, diaphoresis, diarrhea, dry skin, hair loss, heat intolerance, hoarse voice, leg swelling, nail problem, palpitations, tremors, visual change, weight gain or weight loss. The symptoms have been improving. Past treatments include levothyroxine. The treatment provided moderate relief.      Review of Systems  Constitutional: Positive for fatigue. Negative for fever, chills, weight loss, weight gain, diaphoresis, activity change, appetite change and unexpected weight change.  HENT: Negative.  Negative for hoarse voice.   Eyes: Negative.   Respiratory: Negative.  Negative for cough, choking, chest tightness, shortness of breath, wheezing and stridor.   Cardiovascular: Negative.  Negative for chest pain, palpitations and leg swelling.  Gastrointestinal: Negative.  Negative for nausea, vomiting, abdominal pain, diarrhea and constipation.  Endocrine: Negative.  Negative for cold intolerance and heat intolerance.  Genitourinary: Negative.   Musculoskeletal: Negative.   Skin: Negative.   Allergic/Immunologic: Negative.   Neurological: Negative.  Negative for dizziness, tremors, weakness, light-headedness and headaches.  Hematological: Negative.  Negative for adenopathy. Does not bruise/bleed easily.  Psychiatric/Behavioral: Negative.        Objective:   Physical Exam  Vitals reviewed. Constitutional: He is oriented to person, place, and time. He appears well-developed and well-nourished. No distress.  HENT:  Head: Normocephalic and atraumatic.  Mouth/Throat: Oropharynx is clear and moist. No oropharyngeal exudate.  Eyes: Conjunctivae are normal. Right eye exhibits no discharge. Left eye exhibits no discharge. No scleral icterus.  Neck: Normal range of motion. No JVD  present. No tracheal deviation present. No thyromegaly present.  Cardiovascular: Normal rate, regular rhythm, normal heart sounds and intact distal pulses.  Exam reveals no gallop and no friction rub.   No murmur heard. Pulmonary/Chest: Effort normal and breath sounds normal. No stridor. No respiratory distress. He has no wheezes. He has no rales. He exhibits no tenderness.  Abdominal: Soft. Bowel sounds are normal. He exhibits no distension and no mass. There is no tenderness. There is no rebound and no guarding.  Musculoskeletal: Normal range of motion. He exhibits no edema and no tenderness.  Lymphadenopathy:    He has no cervical adenopathy.  Neurological: He is oriented to person, place, and time.  Skin: Skin is warm and dry. No rash noted. He is not diaphoretic. No erythema. No pallor.     Lab Results  Component Value Date   WBC 5.5 06/06/2013   HGB 12.3* 06/06/2013   HCT 36.2* 06/06/2013   PLT 212.0 06/06/2013   GLUCOSE 79 06/06/2013   CHOL 157 03/24/2013   TRIG 48.0 03/24/2013   HDL 61.40 03/24/2013   LDLCALC 86 03/24/2013   ALT 17 11/20/2011   AST 17 11/20/2011   NA 137 06/06/2013   K 4.1 06/06/2013   CL 99 06/06/2013   CREATININE 1.1 06/06/2013   BUN 14 06/06/2013   CO2 31 06/06/2013   TSH 4.35 06/06/2013   PSA 2.32 05/07/2011   HGBA1C 5.5 05/07/2011       Assessment & Plan:

## 2013-10-05 NOTE — Assessment & Plan Note (Signed)
I will recheck her TSH and will adjust his dose if needed

## 2013-10-05 NOTE — Assessment & Plan Note (Signed)
His BP is well controlled I will check his lytes and renal function 

## 2013-10-05 NOTE — Assessment & Plan Note (Signed)
He does not want to do the NS anymore I will change to high dose oral replacement

## 2013-10-05 NOTE — Patient Instructions (Signed)

## 2013-10-19 ENCOUNTER — Other Ambulatory Visit: Payer: Self-pay | Admitting: Internal Medicine

## 2013-10-19 DIAGNOSIS — M75122 Complete rotator cuff tear or rupture of left shoulder, not specified as traumatic: Secondary | ICD-10-CM

## 2013-10-19 MED ORDER — CELECOXIB 200 MG PO CAPS: 200.0000 mg | ORAL_CAPSULE | Freq: Every day | ORAL | Status: DC

## 2013-11-08 ENCOUNTER — Ambulatory Visit (INDEPENDENT_AMBULATORY_CARE_PROVIDER_SITE_OTHER): Payer: 59

## 2013-11-08 DIAGNOSIS — D518 Other vitamin B12 deficiency anemias: Secondary | ICD-10-CM

## 2013-11-08 MED ORDER — CYANOCOBALAMIN 1000 MCG/ML IJ SOLN
1000.0000 ug | Freq: Once | INTRAMUSCULAR | Status: AC
  Administered 2013-11-08: 1000 ug via INTRAMUSCULAR

## 2013-11-24 ENCOUNTER — Encounter: Payer: Self-pay | Admitting: Internal Medicine

## 2013-12-08 ENCOUNTER — Ambulatory Visit (INDEPENDENT_AMBULATORY_CARE_PROVIDER_SITE_OTHER): Payer: 59 | Admitting: *Deleted

## 2013-12-08 DIAGNOSIS — D518 Other vitamin B12 deficiency anemias: Secondary | ICD-10-CM

## 2013-12-08 MED ORDER — CYANOCOBALAMIN 1000 MCG/ML IJ SOLN
1000.0000 ug | Freq: Once | INTRAMUSCULAR | Status: AC
  Administered 2013-12-08: 1000 ug via INTRAMUSCULAR

## 2013-12-22 NOTE — Telephone Encounter (Signed)
Close Encounter 

## 2014-01-02 ENCOUNTER — Other Ambulatory Visit: Payer: Self-pay | Admitting: Internal Medicine

## 2014-01-09 ENCOUNTER — Ambulatory Visit (INDEPENDENT_AMBULATORY_CARE_PROVIDER_SITE_OTHER): Payer: Medicare Other | Admitting: *Deleted

## 2014-01-09 DIAGNOSIS — D518 Other vitamin B12 deficiency anemias: Secondary | ICD-10-CM

## 2014-01-09 MED ORDER — CYANOCOBALAMIN 1000 MCG/ML IJ SOLN
1000.0000 ug | Freq: Once | INTRAMUSCULAR | Status: AC
  Administered 2014-01-09: 1000 ug via INTRAMUSCULAR

## 2014-01-25 ENCOUNTER — Ambulatory Visit: Payer: 59 | Admitting: Internal Medicine

## 2014-01-26 ENCOUNTER — Other Ambulatory Visit: Payer: Self-pay | Admitting: Dermatology

## 2014-02-02 ENCOUNTER — Encounter: Payer: Self-pay | Admitting: Internal Medicine

## 2014-02-02 ENCOUNTER — Ambulatory Visit (INDEPENDENT_AMBULATORY_CARE_PROVIDER_SITE_OTHER): Payer: Medicare Other | Admitting: Internal Medicine

## 2014-02-02 VITALS — BP 100/60 | HR 60 | Ht 70.0 in | Wt 132.0 lb

## 2014-02-02 DIAGNOSIS — I739 Peripheral vascular disease, unspecified: Secondary | ICD-10-CM

## 2014-02-02 NOTE — Patient Instructions (Signed)
Please discuss the tired legs with Dr. Yetta Barre next week. It may be from reduced blood flow to the legs and further evaluation may make sense.  I do not think you need any gastrointestinal testing. Come back to see me if you have stomach or intestine problems.  I appreciate the opportunity to care for you. Iva Boop, MD, [facility]

## 2014-02-03 ENCOUNTER — Encounter: Payer: Self-pay | Admitting: Internal Medicine

## 2014-02-03 NOTE — Progress Notes (Signed)
   Subjective:    Patient ID: Connor Miller, male    DOB: 1923-02-25, 80 y.o.   MRN: [id number]  HPI The patient is here with complaints of weight loss over several years. Denies change in appetite. He also says he is having problems with iron laden he walks. He still remains active and cut the grass but says his leg and fatigued. He seems unaware of the diagnosis of peripheral artery disease and claudication and is on his chart. He is wondering if he needs a GI workup because of the weight loss. He had a colonoscopy by Dr. Corinda Gubler in 2008 that showed diverticulosis and one in 2005 that showed a diminutive colon polyp that was destroyed and another in 2002 that showed a colon polyp that was removed and it was diminutive. Wt Readings from Last 3 Encounters:  02/02/14 132 lb (59.875 kg)  10/05/13 134 lb 4 oz (60.895 kg)  07/01/13 137 lb 4 oz (62.256 kg)  Medications, allergies, past medical history, past surgical history, family history and social history are reviewed and updated in the EMR.   Review of Systems As above all other review of systems appears negative at this time    Objective:   Physical Exam Patient is a thin elderly white man in no acute distress Abdomen is without bruits. Left femoral pulse right femoral pulses are palpated no bruits. His legs and feet are warm though slightly dusky, I cannot palpate DP or PT pulses on either foot. He is alert and oriented x3.     Assessment & Plan:   1. Claudication?    I cannot really substantiate weight loss that isn't unexpected given his age and overall situation. It sounds to me like he is having claudication and he will be seeing Dr. Yetta Barre next week and I advised to discuss that with him. I don't know if he is a candidate for any type of intervention, that is could be too aggressive for somebody his age though it may be reasonable, given his overall good condition despite the age to have him see vascular surgeons. Will defer to Dr.  Yetta Barre of primary care.  I do not think any GI workup is warranted and explained that to the patient. I will see him as needed.  CC: Sanda Linger, MD

## 2014-02-08 ENCOUNTER — Ambulatory Visit: Payer: Medicare Other

## 2014-02-08 ENCOUNTER — Encounter: Payer: Self-pay | Admitting: Internal Medicine

## 2014-02-08 ENCOUNTER — Other Ambulatory Visit (INDEPENDENT_AMBULATORY_CARE_PROVIDER_SITE_OTHER): Payer: Medicare Other

## 2014-02-08 ENCOUNTER — Ambulatory Visit (INDEPENDENT_AMBULATORY_CARE_PROVIDER_SITE_OTHER): Payer: Medicare Other | Admitting: Internal Medicine

## 2014-02-08 VITALS — BP 96/70 | HR 69 | Temp 98.2°F | Resp 16 | Ht 70.0 in | Wt 130.8 lb

## 2014-02-08 DIAGNOSIS — I10 Essential (primary) hypertension: Secondary | ICD-10-CM

## 2014-02-08 DIAGNOSIS — Z23 Encounter for immunization: Secondary | ICD-10-CM

## 2014-02-08 DIAGNOSIS — E039 Hypothyroidism, unspecified: Secondary | ICD-10-CM

## 2014-02-08 DIAGNOSIS — D518 Other vitamin B12 deficiency anemias: Secondary | ICD-10-CM

## 2014-02-08 DIAGNOSIS — I70219 Atherosclerosis of native arteries of extremities with intermittent claudication, unspecified extremity: Secondary | ICD-10-CM | POA: Insufficient documentation

## 2014-02-08 LAB — CBC WITH DIFFERENTIAL/PLATELET
BASOS PCT: 0.3 % (ref 0.0–3.0)
Basophils Absolute: 0 10*3/uL (ref 0.0–0.1)
Eosinophils Absolute: 0.1 10*3/uL (ref 0.0–0.7)
Eosinophils Relative: 1.2 % (ref 0.0–5.0)
HCT: 36.6 % — ABNORMAL LOW (ref 39.0–52.0)
Hemoglobin: 12.4 g/dL — ABNORMAL LOW (ref 13.0–17.0)
LYMPHS PCT: 27.7 % (ref 12.0–46.0)
Lymphs Abs: 1.2 10*3/uL (ref 0.7–4.0)
MCHC: 33.9 g/dL (ref 30.0–36.0)
MCV: 104.6 fl — ABNORMAL HIGH (ref 78.0–100.0)
MONOS PCT: 15 % — AB (ref 3.0–12.0)
Monocytes Absolute: 0.7 10*3/uL (ref 0.1–1.0)
NEUTROS ABS: 2.5 10*3/uL (ref 1.4–7.7)
Neutrophils Relative %: 55.8 % (ref 43.0–77.0)
Platelets: 181 10*3/uL (ref 150.0–400.0)
RBC: 3.5 Mil/uL — AB (ref 4.22–5.81)
RDW: 14.9 % (ref 11.5–15.5)
WBC: 4.5 10*3/uL (ref 4.0–10.5)

## 2014-02-08 LAB — BASIC METABOLIC PANEL
BUN: 19 mg/dL (ref 6–23)
CHLORIDE: 100 meq/L (ref 96–112)
CO2: 29 meq/L (ref 19–32)
CREATININE: 1.4 mg/dL (ref 0.4–1.5)
Calcium: 9.2 mg/dL (ref 8.4–10.5)
GFR: 49.09 mL/min — ABNORMAL LOW (ref >60.00)
Glucose, Bld: 82 mg/dL (ref 70–99)
Potassium: 3.9 mEq/L (ref 3.5–5.1)
Sodium: 137 mEq/L (ref 135–145)

## 2014-02-08 LAB — LIPID PANEL
CHOL/HDL RATIO: 3
Cholesterol: 154 mg/dL (ref 0–200)
HDL: 51.3 mg/dL (ref >39.00)
LDL CALC: 84 mg/dL (ref 0–99)
NONHDL: 102.7
Triglycerides: 94 mg/dL (ref 0.0–149.0)
VLDL: 18.8 mg/dL (ref 0.0–40.0)

## 2014-02-08 LAB — TSH: TSH: 2.93 u[IU]/mL (ref 0.35–4.50)

## 2014-02-08 MED ORDER — CYANOCOBALAMIN 1000 MCG/ML IJ SOLN
1000.0000 ug | Freq: Once | INTRAMUSCULAR | Status: AC
  Administered 2014-02-08: 1000 ug via INTRAMUSCULAR

## 2014-02-08 NOTE — Patient Instructions (Signed)
Hypothyroidism The thyroid is a large gland located in the lower front of your neck. The thyroid gland helps control metabolism. Metabolism is how your body handles food. It controls metabolism with the hormone thyroxine. When this gland is underactive (hypothyroid), it produces too little hormone.  CAUSES These include:   Absence or destruction of thyroid tissue.  Goiter due to iodine deficiency.  Goiter due to medications.  Congenital defects (since birth).  Problems with the pituitary. This causes a lack of TSH (thyroid stimulating hormone). This hormone tells the thyroid to turn out more hormone. SYMPTOMS  Lethargy (feeling as though you have no energy)  Cold intolerance  Weight gain (in spite of normal food intake)  Dry skin  Coarse hair  Menstrual irregularity (if severe, may lead to infertility)  Slowing of thought processes Cardiac problems are also caused by insufficient amounts of thyroid hormone. Hypothyroidism in the newborn is cretinism, and is an extreme form. It is important that this form be treated adequately and immediately or it will lead rapidly to retarded physical and mental development. DIAGNOSIS  To prove hypothyroidism, your caregiver may do blood tests and ultrasound tests. Sometimes the signs are hidden. It may be necessary for your caregiver to watch this illness with blood tests either before or after diagnosis and treatment. TREATMENT  Low levels of thyroid hormone are increased by using synthetic thyroid hormone. This is a safe, effective treatment. It usually takes about four weeks to gain the full effects of the medication. After you have the full effect of the medication, it will generally take another four weeks for problems to leave. Your caregiver may start you on low doses. If you have had heart problems the dose may be gradually increased. It is generally not an emergency to get rapidly to normal. HOME CARE INSTRUCTIONS   Take your  medications as your caregiver suggests. Let your caregiver know of any medications you are taking or start taking. Your caregiver will help you with dosage schedules.  As your condition improves, your dosage needs may increase. It will be necessary to have continuing blood tests as suggested by your caregiver.  Report all suspected medication side effects to your caregiver. SEEK MEDICAL CARE IF: Seek medical care if you develop:  Sweating.  Tremulousness (tremors).  Anxiety.  Rapid weight loss.  Heat intolerance.  Emotional swings.  Diarrhea.  Weakness. SEEK IMMEDIATE MEDICAL CARE IF:  You develop chest pain, an irregular heart beat (palpitations), or a rapid heart beat. MAKE SURE YOU:   Understand these instructions.  Will watch your condition.  Will get help right away if you are not doing well or get worse. Document Released: 05/26/2005 Document Revised: 08/18/2011 Document Reviewed: 01/14/2008 Exit[employer] Patient Information 2015 [employer]. This information is not intended to replace advice given to you by your health care provider. Make sure you discuss any questions you have with your health care provider.  

## 2014-02-08 NOTE — Progress Notes (Signed)
Pre visit review using our clinic review tool, if applicable. No additional management support is needed unless otherwise documented below in the visit note. 

## 2014-02-08 NOTE — Progress Notes (Signed)
   Subjective:    Patient ID: Connor Miller, male    DOB: 06-07-1923, 80 y.o.   MRN: [id number]  Thyroid Problem Presents for follow-up visit. Symptoms include fatigue. Patient reports no anxiety, cold intolerance, constipation, depressed mood, diaphoresis, diarrhea, dry skin, hair loss, heat intolerance, hoarse voice, leg swelling, nail problem, palpitations, tremors, visual change, weight gain or weight loss. The symptoms have been stable. Past treatments include levothyroxine. The treatment provided significant relief. His past medical history is significant for neuropathy. There is no history of atrial fibrillation, dementia, heart failure or hyperlipidemia.      Review of Systems  Constitutional: Positive for fatigue. Negative for fever, chills, weight loss, weight gain, diaphoresis, activity change, appetite change and unexpected weight change.  HENT: Negative.  Negative for hoarse voice.   Eyes: Negative.   Respiratory: Negative.  Negative for cough, choking, chest tightness, shortness of breath and stridor.   Cardiovascular: Negative.  Negative for chest pain, palpitations and leg swelling.  Gastrointestinal: Negative.  Negative for nausea, vomiting, abdominal pain, diarrhea and constipation.  Endocrine: Negative.  Negative for cold intolerance and heat intolerance.  Genitourinary: Negative.   Musculoskeletal: Negative.   Skin: Negative.   Allergic/Immunologic: Negative.   Neurological: Negative.  Negative for tremors.  Hematological: Negative.  Negative for adenopathy. Does not bruise/bleed easily.  Psychiatric/Behavioral: Negative.        Objective:   Physical Exam  Vitals reviewed. Constitutional: He is oriented to person, place, and time. He appears well-developed and well-nourished. No distress.  HENT:  Head: Normocephalic and atraumatic.  Mouth/Throat: Oropharynx is clear and moist. No oropharyngeal exudate.  Eyes: Conjunctivae are normal. Right eye exhibits no  discharge. Left eye exhibits no discharge. No scleral icterus.  Neck: Normal range of motion. Neck supple. No JVD present. No tracheal deviation present. No thyromegaly present.  Cardiovascular: Normal rate, regular rhythm, normal heart sounds and intact distal pulses.  Exam reveals no gallop and no friction rub.   No murmur heard. Pulmonary/Chest: Effort normal and breath sounds normal. No stridor. No respiratory distress. He has no wheezes. He has no rales. He exhibits no tenderness.  Abdominal: Soft. Bowel sounds are normal. He exhibits no distension and no mass. There is no tenderness. There is no rebound and no guarding.  Musculoskeletal: Normal range of motion. He exhibits no edema and no tenderness.  Lymphadenopathy:    He has no cervical adenopathy.  Neurological: He is oriented to person, place, and time.  Skin: Skin is warm and dry. No rash noted. He is not diaphoretic. No erythema. No pallor.     Lab Results  Component Value Date   WBC 5.5 06/06/2013   HGB 12.3* 06/06/2013   HCT 36.2* 06/06/2013   PLT 212.0 06/06/2013   GLUCOSE 78 10/05/2013   CHOL 157 03/24/2013   TRIG 48.0 03/24/2013   HDL 61.40 03/24/2013   LDLCALC 86 03/24/2013   ALT 17 11/20/2011   AST 17 11/20/2011   NA 134* 10/05/2013   K 4.2 10/05/2013   CL 97 10/05/2013   CREATININE 1.0 10/05/2013   BUN 14 10/05/2013   CO2 32 10/05/2013   TSH 3.72 10/05/2013   PSA 2.32 05/07/2011   HGBA1C 5.5 05/07/2011       Assessment & Plan:

## 2014-02-09 ENCOUNTER — Encounter: Payer: Self-pay | Admitting: Internal Medicine

## 2014-02-09 ENCOUNTER — Telehealth: Payer: Self-pay | Admitting: Internal Medicine

## 2014-02-09 DIAGNOSIS — R627 Adult failure to thrive: Secondary | ICD-10-CM

## 2014-02-09 NOTE — Assessment & Plan Note (Signed)
His TSH is on the normal range He will stay on the current dose 

## 2014-02-09 NOTE — Assessment & Plan Note (Signed)
His BP is low today I have asked him to stop the diuretic for now

## 2014-02-09 NOTE — Assessment & Plan Note (Signed)
B12 injection today 

## 2014-02-09 NOTE — Telephone Encounter (Signed)
Pt states Dr. Yetta Barre was suppose to refer him to a nutritionist.  He called to check the status of that.

## 2014-02-14 DIAGNOSIS — R627 Adult failure to thrive: Secondary | ICD-10-CM

## 2014-02-14 HISTORY — DX: Adult failure to thrive: R62.7

## 2014-02-14 NOTE — Telephone Encounter (Signed)
done

## 2014-03-10 ENCOUNTER — Ambulatory Visit (INDEPENDENT_AMBULATORY_CARE_PROVIDER_SITE_OTHER): Payer: Medicare Other | Admitting: *Deleted

## 2014-03-10 DIAGNOSIS — E538 Deficiency of other specified B group vitamins: Secondary | ICD-10-CM

## 2014-03-10 MED ORDER — CYANOCOBALAMIN 1000 MCG/ML IJ SOLN
1000.0000 ug | Freq: Once | INTRAMUSCULAR | Status: AC
  Administered 2014-03-10: 1000 ug via INTRAMUSCULAR

## 2014-03-13 ENCOUNTER — Ambulatory Visit (INDEPENDENT_AMBULATORY_CARE_PROVIDER_SITE_OTHER): Payer: Medicare Other | Admitting: Podiatry

## 2014-03-13 ENCOUNTER — Encounter: Payer: Self-pay | Admitting: Podiatry

## 2014-03-13 VITALS — BP 100/65 | HR 75 | Resp 16

## 2014-03-13 DIAGNOSIS — M79673 Pain in unspecified foot: Secondary | ICD-10-CM

## 2014-03-13 DIAGNOSIS — B351 Tinea unguium: Secondary | ICD-10-CM

## 2014-03-13 NOTE — Progress Notes (Signed)
   Subjective:    Patient ID: Connor Miller, male    DOB: [birth year], 80 y.o.   MRN: [id number]  HPI Comments: Patient states that he needs to have his toenails cut.      Review of Systems  Endocrine: Positive for cold intolerance.  Neurological: Positive for weakness.  All other systems reviewed and are negative.      Objective:   Physical Exam        Assessment & Plan:

## 2014-03-14 NOTE — Progress Notes (Signed)
Subjective:     Patient ID: Connor Miller, male   DOB: [birth year], 80 y.o.   MRN: [id number]  HPI patient presents with thick yellow nailbeds 1-5 both feet that can become painful and he cannot cut   Review of Systems     Objective:   Physical Exam Neurovascular status intact with thick yellow brittle nailbeds 1-5 both feet    Assessment:     Mycotic nail infection is with pain 1-5 both feet    Plan:     Debride painful nailbeds 1-5 both feet with no iatrogenic bleeding noted

## 2014-03-21 ENCOUNTER — Emergency Department (HOSPITAL_COMMUNITY): Payer: Medicare Other

## 2014-03-21 ENCOUNTER — Encounter (HOSPITAL_COMMUNITY): Payer: Self-pay | Admitting: Emergency Medicine

## 2014-03-21 ENCOUNTER — Emergency Department (HOSPITAL_COMMUNITY)
Admission: EM | Admit: 2014-03-21 | Discharge: 2014-03-21 | Disposition: A | Discharge status: Home / Self Care | Payer: Medicare Other | Attending: Emergency Medicine | Admitting: Emergency Medicine

## 2014-03-21 ENCOUNTER — Telehealth: Payer: Self-pay | Admitting: Family Medicine

## 2014-03-21 ENCOUNTER — Emergency Department (INDEPENDENT_AMBULATORY_CARE_PROVIDER_SITE_OTHER)
Admission: EM | Admit: 2014-03-21 | Discharge: 2014-03-21 | Disposition: A | Discharge status: Home / Self Care | Payer: Medicare Other | Source: Home / Self Care

## 2014-03-21 DIAGNOSIS — I10 Essential (primary) hypertension: Secondary | ICD-10-CM | POA: Diagnosis not present

## 2014-03-21 DIAGNOSIS — Z791 Long term (current) use of non-steroidal anti-inflammatories (NSAID): Secondary | ICD-10-CM | POA: Diagnosis not present

## 2014-03-21 DIAGNOSIS — Z86010 Personal history of colonic polyps: Secondary | ICD-10-CM | POA: Diagnosis not present

## 2014-03-21 DIAGNOSIS — M19011 Primary osteoarthritis, right shoulder: Secondary | ICD-10-CM

## 2014-03-21 DIAGNOSIS — E039 Hypothyroidism, unspecified: Secondary | ICD-10-CM | POA: Diagnosis not present

## 2014-03-21 DIAGNOSIS — M08911 Juvenile arthritis, unspecified, right shoulder: Secondary | ICD-10-CM | POA: Insufficient documentation

## 2014-03-21 DIAGNOSIS — Z862 Personal history of diseases of the blood and blood-forming organs and certain disorders involving the immune mechanism: Secondary | ICD-10-CM | POA: Insufficient documentation

## 2014-03-21 DIAGNOSIS — M6281 Muscle weakness (generalized): Secondary | ICD-10-CM | POA: Diagnosis present

## 2014-03-21 DIAGNOSIS — Z79899 Other long term (current) drug therapy: Secondary | ICD-10-CM | POA: Insufficient documentation

## 2014-03-21 DIAGNOSIS — Z8719 Personal history of other diseases of the digestive system: Secondary | ICD-10-CM | POA: Diagnosis not present

## 2014-03-21 DIAGNOSIS — Z87448 Personal history of other diseases of urinary system: Secondary | ICD-10-CM | POA: Insufficient documentation

## 2014-03-21 DIAGNOSIS — R278 Other lack of coordination: Secondary | ICD-10-CM

## 2014-03-21 DIAGNOSIS — R29898 Other symptoms and signs involving the musculoskeletal system: Secondary | ICD-10-CM

## 2014-03-21 LAB — COMPREHENSIVE METABOLIC PANEL
ALBUMIN: 3.7 g/dL (ref 3.5–5.2)
ALT: 12 U/L (ref 0–53)
AST: 18 U/L (ref 0–37)
Alkaline Phosphatase: 85 U/L (ref 39–117)
Anion gap: 9 (ref 5–15)
BUN: 22 mg/dL (ref 6–23)
CALCIUM: 9.3 mg/dL (ref 8.4–10.5)
CO2: 29 meq/L (ref 19–32)
Chloride: 99 mEq/L (ref 96–112)
Creatinine, Ser: 1.11 mg/dL (ref 0.50–1.35)
GFR calc Af Amer: 66 mL/min — ABNORMAL LOW (ref >90)
GFR, EST NON AFRICAN AMERICAN: 57 mL/min — AB (ref >90)
Glucose, Bld: 92 mg/dL (ref 70–99)
Potassium: 4.2 mEq/L (ref 3.7–5.3)
SODIUM: 137 meq/L (ref 137–147)
Total Bilirubin: 0.5 mg/dL (ref 0.3–1.2)
Total Protein: 6.7 g/dL (ref 6.0–8.3)

## 2014-03-21 LAB — DIFFERENTIAL
BASOS ABS: 0 10*3/uL (ref 0.0–0.1)
Basophils Relative: 0 % (ref 0–1)
EOS PCT: 2 % (ref 0–5)
Eosinophils Absolute: 0.1 10*3/uL (ref 0.0–0.7)
LYMPHS PCT: 35 % (ref 12–46)
Lymphs Abs: 1.4 10*3/uL (ref 0.7–4.0)
Monocytes Absolute: 0.6 10*3/uL (ref 0.1–1.0)
Monocytes Relative: 15 % — ABNORMAL HIGH (ref 3–12)
Neutro Abs: 1.9 10*3/uL (ref 1.7–7.7)
Neutrophils Relative %: 48 % (ref 43–77)

## 2014-03-21 LAB — I-STAT CHEM 8, ED
BUN: 21 mg/dL (ref 6–23)
Calcium, Ion: 1.2 mmol/L (ref 1.13–1.30)
Chloride: 96 mEq/L (ref 96–112)
Creatinine, Ser: 1.1 mg/dL (ref 0.50–1.35)
Glucose, Bld: 91 mg/dL (ref 70–99)
HEMATOCRIT: 36 % — AB (ref 39.0–52.0)
HEMOGLOBIN: 12.2 g/dL — AB (ref 13.0–17.0)
POTASSIUM: 4 meq/L (ref 3.7–5.3)
Sodium: 135 mEq/L — ABNORMAL LOW (ref 137–147)
TCO2: 26 mmol/L (ref 0–100)

## 2014-03-21 LAB — CBC
HCT: 33.6 % — ABNORMAL LOW (ref 39.0–52.0)
Hemoglobin: 11.6 g/dL — ABNORMAL LOW (ref 13.0–17.0)
MCH: 34.4 pg — ABNORMAL HIGH (ref 26.0–34.0)
MCHC: 34.5 g/dL (ref 30.0–36.0)
MCV: 99.7 fL (ref 78.0–100.0)
PLATELETS: 185 10*3/uL (ref 150–400)
RBC: 3.37 MIL/uL — ABNORMAL LOW (ref 4.22–5.81)
RDW: 13.6 % (ref 11.5–15.5)
WBC: 4.1 10*3/uL (ref 4.0–10.5)

## 2014-03-21 LAB — APTT: APTT: 31 s (ref 24–37)

## 2014-03-21 LAB — I-STAT TROPONIN, ED: TROPONIN I, POC: 0.02 ng/mL (ref 0.00–0.08)

## 2014-03-21 LAB — PROTIME-INR
INR: 1.11 (ref 0.00–1.49)
PROTHROMBIN TIME: 14.3 s (ref 11.6–15.2)

## 2014-03-21 NOTE — Discharge Instructions (Signed)
Use heat on the right shoulder, 3 or 4 times a day for 30 minutes. Take acetaminophen, 650 mg 4 times a day for one week. Do some gentle range of motion exercises, of the right shoulder, by gently swinging the right arm.    Arthritis, Nonspecific Arthritis is pain, redness, warmth, or puffiness (inflammation) of a joint. The joint may be stiff or hurt when you move it. One or more joints may be affected. There are many types of arthritis. Your doctor may not know what type you have right away. The most common cause of arthritis is wear and tear on the joint (osteoarthritis). HOME CARE   Only take medicine as told by your doctor.  Rest the joint as much as possible.  Raise (elevate) your joint if it is puffy.  Use crutches if the painful joint is in your leg.  Drink enough fluids to keep your pee (urine) clear or pale yellow.  Follow your doctor's diet instructions.  Use cold packs for very bad joint pain for 10 to 15 minutes every hour. Ask your doctor if it is okay for you to use hot packs.  Exercise as told by your doctor.  Take a warm shower if you have stiffness in the morning.  Move your sore joints throughout the day. GET HELP RIGHT AWAY IF:   You have a fever.  You have very bad joint pain, puffiness, or redness.  You have many joints that are painful and puffy.  You are not getting better with treatment.  You have very bad back pain or leg weakness.  You cannot control when you poop (bowel movement) or pee (urinate).  You do not feel better in 24 hours or are getting worse.  You are having side effects from your medicine. MAKE SURE YOU:   Understand these instructions.  Will watch your condition.  Will get help right away if you are not doing well or get worse. Document Released: 08/20/2009 Document Revised: 11/25/2011 Document Reviewed: 08/20/2009 [facility] Patient Information 2015 [employer] [place]. This information is not intended to replace advice given  to you by your health care provider. Make sure you discuss any questions you have with your health care provider.

## 2014-03-21 NOTE — Telephone Encounter (Signed)
Patient stated that his right arm is giving out on him, he can't comb his hair are lift his arm. Please advise

## 2014-03-21 NOTE — ED Notes (Signed)
Pt is going to be transferred to ED [place]. Spoke with 1st nurse Ali LoweJenna Gage RN informed her of pt coming to the ED via shuttle

## 2014-03-21 NOTE — Telephone Encounter (Signed)
Pt need appointment to be assessed. Thanks

## 2014-03-21 NOTE — ED Provider Notes (Signed)
CSN: [id number]     Arrival date & time 03/21/14  1628 History   First MD Initiated Contact with Patient 03/21/14 1823     Chief Complaint  Patient presents with  . Extremity Weakness    right arm     (Consider location/radiation/quality/duration/timing/severity/associated sxs/prior Treatment) Patient is a 80 y.o. male presenting with extremity weakness. The history is provided by the patient and the spouse.  Extremity Weakness   He complains of difficulty moving his right arm, above the shoulder level, for 3 days. The problem started insidiously, after he awoke. He is headache, trauma to the shoulder, neck, or head. There is no other weakness, and he does not have paresthesias, difficulty walking, or trouble eating.he's never had this problem previously. He has not had any fever, chills, cough, shortness of breath,  chest pain, or abdominal pain. He is taking his usual medications.he has had significantly loss over the last year, and has talked to his doctor about that. He is able to eat well. No other known modifying factors.  Past Medical History  Diagnosis Date  . Anemia     NOS  . Hypertension   . Hypothyroidism   . Colon polyps   . BPH (benign prostatic hyperplasia)   . Diverticulosis    Past Surgical History  Procedure Laterality Date  . Eye surgery    . Hemorrhoid surgery  1972  . 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 Inguinal hernia repair  04/1995  . Colonoscopy      multiple   Family History  Problem Relation Age of Onset  . Lung cancer Father   . Lung cancer Brother   . Other Mother     aorta ruptured   History  Substance Use Topics  . Smoking status: Never Smoker   . Smokeless tobacco: Never Used     Comment: "few cigarettes when young"  . Alcohol Use: No     Comment: occasionally,     Review of Systems  Musculoskeletal: Positive for extremity weakness.  All other systems reviewed and are negative.     Allergies  Review of patient's  allergies indicates no known allergies.  Home Medications   Prior to Admission medications   Medication Sig Start Date End Date Taking? Authorizing Provider  celecoxib (CELEBREX) 200 MG capsule Take 1 capsule (200 mg total) by mouth daily. 10/19/13  Yes Etta Grandchildhomas L Jones, MD  dorzolamide-timolol (COSOPT) 22.3-6.8 MG/ML ophthalmic solution Place 1 drop into the left eye 2 (two) times daily.    Yes Historical Provider, MD  levothyroxine (SYNTHROID, LEVOTHROID) 75 MCG tablet Take 75 mcg by mouth daily before breakfast.   Yes Historical Provider, MD  triamterene (DYRENIUM) 100 MG capsule Take 100 mg by mouth daily.   Yes Historical Provider, MD   BP 134/80  Pulse 70  Temp(Src) 97.5 F (36.4 C) (Oral)  Resp 16  Ht 5\' 10"  (1.778 m)  Wt 132 lb (59.875 kg)  BMI 18.94 kg/m2  SpO2 99% Physical Exam  Nursing note and vitals reviewed. Constitutional: He is oriented to person, place, and time. He appears well-developed.  Frail, elderly  HENT:  Head: Normocephalic and atraumatic.  Right Ear: External ear normal.  Left Ear: External ear normal.  Eyes: Conjunctivae and EOM are normal. Pupils are equal, round, and reactive to light.  Neck: Normal range of motion and phonation normal. Neck supple.  Cardiovascular: Normal rate, regular rhythm and normal heart sounds.   Pulmonary/Chest: Effort normal and  breath sounds normal. He exhibits no bony tenderness.  Abdominal: Soft. There is no tenderness.  Musculoskeletal: Normal range of motion.  Right shoulder- crepitation, with passive range of motion. No deformity of right shoulder. Full range of motion neck. With attempt at active flexion of the right shoulder. He stops at about 75. He, states that it just won't go further. I am able to easily passively move his right shoulder, to flexion, about 130.   Neurological: He is alert and oriented to person, place, and time. No cranial nerve deficit or sensory deficit. He exhibits normal muscle tone.  Coordination normal.  Normal strength and sensation arms, and legs, bilaterally. When I elevate his right arm, above the level of the shoulder, he can hold it normally.  Skin: Skin is warm, dry and intact.  Psychiatric: He has a normal mood and affect. His behavior is normal. Judgment and thought content normal.    ED Course  Procedures (including critical care time)  Medications - No data to display  Patient Vitals for the past 24 hrs:  BP Temp Temp src Pulse Resp SpO2 Height Weight  03/21/14 1908 140/83 mmHg 97.8 F (36.6 C) Oral 64 16 99 % - -  03/21/14 1755 - 97.5 F (36.4 C) - - - - - -  03/21/14 1655 134/80 mmHg 97.6 F (36.4 C) Oral 70 16 99 % 5\' 10"  (1.778 m) 132 lb (59.875 kg)    7:14 PM Reevaluation with update and discussion. After initial assessment and treatment, an updated evaluation reveals findings discussed with patient, and wife, all questions answered.Mancel Bale L    Labs Review Labs Reviewed  CBC - Abnormal; Notable for the following:    RBC 3.37 (*)    Hemoglobin 11.6 (*)    HCT 33.6 (*)    MCH 34.4 (*)    All other components within normal limits  DIFFERENTIAL - Abnormal; Notable for the following:    Monocytes Relative 15 (*)    All other components within normal limits  I-STAT CHEM 8, ED - Abnormal; Notable for the following:    Sodium 135 (*)    Hemoglobin 12.2 (*)    HCT 36.0 (*)    All other components within normal limits  PROTIME-INR  APTT  COMPREHENSIVE METABOLIC PANEL  I-STAT TROPOININ, ED  CBG MONITORING, ED    Imaging Review Ct Head (brain) Wo Contrast  03/21/2014   CLINICAL DATA:  Right arm weakness.  History of hypertension.  EXAM: CT HEAD WITHOUT CONTRAST  TECHNIQUE: Contiguous axial images were obtained from the base of the skull through the vertex without intravenous contrast.  COMPARISON:  03/24/2013  FINDINGS: The brainstem, cerebellum, cerebral peduncles, thalamus, basal ganglia, basilar cisterns, and ventricular system  appear within normal limits. Periventricular white matter and corona radiata hypodensities favor chronic ischemic microvascular white matter disease. Small chronic remote lacunar infarcts in the external capsules bilaterally. No intracranial hemorrhage, mass lesion, or acute CVA.  Left scleral band. Visualized paranasal sinuses unremarkable. There is atherosclerotic calcification of the cavernous carotid arteries bilaterally.  IMPRESSION: 1. No acute intracranial findings are observed. 2. Periventricular white matter and corona radiata hypodensities favor chronic ischemic microvascular white matter disease. 3. Remote lacunar infarcts in the external capsules bilaterally.   Electronically Signed   By: Herbie Baltimore M.D.   On: 03/21/2014 17:56     EKG Interpretation None      MDM   Final diagnoses:  Arthritis of right shoulder region    Muscle skeletal  abnormality, right shoulder preventing full range of motion. There is no evidence for focal neurologic deficit, including central, or brachial plexus.    Nursing Notes Reviewed/ Care Coordinated Applicable Imaging Reviewed Interpretation of Laboratory Data incorporated into ED treatment  The patient appears reasonably screened and/or stabilized for discharge and I doubt any other medical condition or other [facility] requiring further screening, evaluation, or treatment in the ED at this time prior to discharge.  Plan: Home Medications- Tylenol; Home Treatments- heat and gentle ROM exercises; return here if the recommended treatment, does not improve the symptoms; Recommended follow up- PCP 3-5 days  Flint MelterElliott L Kaleesi Guyton, MD 03/22/14 [phone]

## 2014-03-21 NOTE — ED Notes (Signed)
Pt states that he woke up Sunday morning and that he could not lift his arm to comb his hair and having a hard time trying to feed his self. Pt denies any pain at this time. No acute distress noted.

## 2014-03-21 NOTE — ED Provider Notes (Signed)
CSN: [id number]     Arrival date & time 03/21/14  1405 History   First MD Initiated Contact with Patient 03/21/14 1523     Chief Complaint  Patient presents with  . Extremity Weakness    right arm   (Consider location/radiation/quality/duration/timing/severity/associated sxs/prior Treatment) HPI Comments: 80 -year-old male awoke Sunday morning, 48 hours ago, with weakness in the right upper arm. The weakness has persisted through today. He is unable to lift the right arm above approximately 80. He notices fatigability in the right arm when he attempts to abduct it as well as tremor with intentional use such as brushing his teeth combing his hair. He is actually unable to abduct the arm sufficiently to adequately perform these tasks. He denies weakness to the hands or other extremities. He denies problems with vision speech hearing or swallowing. Speech is clear and goal oriented. No problem with memory or confusion.   Patient is a 80 y.o. male presenting with extremity weakness.  Extremity Weakness Pertinent negatives include no chest pain, no abdominal pain, no headaches and no shortness of breath.    Past Medical History  Diagnosis Date  . Anemia     NOS  . Hypertension   . Hypothyroidism   . Colon polyps   . BPH (benign prostatic hyperplasia)   . Diverticulosis    Past Surgical History  Procedure Laterality Date  . Eye surgery    . Hemorrhoid surgery  1972  . 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 Inguinal hernia repair  04/1995  . Colonoscopy      multiple   Family History  Problem Relation Age of Onset  . Lung cancer Father   . Lung cancer Brother   . Other Mother     aorta ruptured   History  Substance Use Topics  . Smoking status: Never Smoker   . Smokeless tobacco: Never Used     Comment: "few cigarettes when young"  . Alcohol Use: No     Comment: occasionally,     Review of Systems  Constitutional: Positive for activity change. Negative for fever  and fatigue.  HENT: Negative.   Eyes: Negative for pain and visual disturbance.  Respiratory: Negative for cough, choking, chest tightness and shortness of breath.   Cardiovascular: Negative for chest pain, palpitations and leg swelling.  Gastrointestinal: Negative for nausea, vomiting and abdominal pain.  Genitourinary: Negative for dysuria, urgency, frequency, flank pain and decreased urine volume.       No urinary/fecal incontinence.  Musculoskeletal: Positive for extremity weakness.       Mild chronic neck soreness with limited ROM.  Skin: Negative.   Neurological: Positive for tremors. Negative for dizziness, seizures, syncope, facial asymmetry, speech difficulty, weakness, numbness and headaches.  Psychiatric/Behavioral: Negative.     Allergies  Review of patient's allergies indicates no known allergies.  Home Medications   Prior to Admission medications   Medication Sig Start Date End Date Taking? Authorizing Provider  SYNTHROID 75 MCG tablet TAKE 1 TABLET DAILY 01/02/14  Yes Etta Grandchildhomas L Jones, MD  celecoxib (CELEBREX) 200 MG capsule Take 1 capsule (200 mg total) by mouth daily. 10/19/13   Etta Grandchildhomas L Jones, MD  cyanocobalamin 2000 MCG tablet Take 1 tablet (2,000 mcg total) by mouth daily. 10/05/13   Etta Grandchildhomas L Jones, MD  dorzolamide-timolol (COSOPT) 22.3-6.8 MG/ML ophthalmic solution Place 1 drop into the left eye 2 (two) times daily.     Historical Provider, MD   BP 128/83  Pulse 69  Temp(Src) 99.5 F (37.5 C) (Oral)  Resp 18  SpO2 100% Physical Exam  Nursing note and vitals reviewed. Constitutional: He is oriented to person, place, and time. He appears well-developed and well-nourished. No distress.  HENT:  Head: Normocephalic and atraumatic.  Mouth/Throat: Oropharynx is clear and moist. No oropharyngeal exudate.  Nl swallow reflex, tongue movement and control, soft palate rises symmetrically.   Eyes: Conjunctivae and EOM are normal. Pupils are equal, round, and reactive to  light.  Neck:  Limited ROM , this is old.No tenderness  Cardiovascular: Normal rate, regular rhythm, normal heart sounds and intact distal pulses.   Pulmonary/Chest: Effort normal and breath sounds normal. No respiratory distress. He has no wheezes. He has no rales.  Abdominal: Soft. There is no tenderness.  Musculoskeletal: He exhibits no edema and no tenderness.  Right arm abduction limit to about 80 deg. Passive abduction to above his head produces no pain. He is able to hold this position for a few seconds before fatiguing and the arm slowly falling. As upper arm fatigues with abduction and with intentional use develops low cycle tremor.  No shoulder or upper arm asymmetry or tenderness. Shoulder shrug is symmetric. Wrist/hand strength is 5/5 bilat. Sensation along the length of the RUE is intact.  Lymphadenopathy:    He has no cervical adenopathy.  Neurological: He is alert and oriented to person, place, and time. He has normal reflexes. No cranial nerve deficit or sensory deficit.  Finger to nose with poor coordination and dysmetria with RUE. Normal with LUE.  Skin: Skin is warm and dry. No rash noted. He is not diaphoretic.  Psychiatric: He has a normal mood and affect. His behavior is normal. Judgment and thought content normal.    ED Course  Procedures (including critical care time) Labs Review Labs Reviewed - No data to display  Imaging Review No results found.   MDM   1. Right arm weakness   2. Dysmetria    [facility] Pt is stable and has been since onset of sx's 48 h ago. Evaluation of RUE weakness    Hayden Rasmussenavid Okey Zelek, NP 03/21/14 1557

## 2014-03-21 NOTE — ED Notes (Addendum)
Pt woke up Sunday morning and was unable to raise right arm to shoulder height or higher. Unable to brush his teeth or comb his hair with the right arm. No facial droop, weakness to grip or legs. A/o x 4. Pt states for the past week or so he has been staggering around some. Usually has no walking difficulty.

## 2014-03-22 NOTE — ED Provider Notes (Signed)
Medical screening examination/treatment/procedure(s) were performed by a resident physician or non-physician practitioner and as the supervising physician I was immediately available for consultation/collaboration.  Shelly Flattenavid Danessa Mensch, MD Family Medicine   Ozella Rocksavid J Azriel Dancy, MD 03/22/14 [phone]

## 2014-03-24 ENCOUNTER — Ambulatory Visit (INDEPENDENT_AMBULATORY_CARE_PROVIDER_SITE_OTHER): Payer: Medicare Other | Admitting: Internal Medicine

## 2014-03-24 ENCOUNTER — Encounter: Payer: Self-pay | Admitting: Internal Medicine

## 2014-03-24 VITALS — BP 136/74 | HR 71 | Temp 97.8°F | Resp 16 | Ht 70.0 in | Wt 129.0 lb

## 2014-03-24 DIAGNOSIS — E038 Other specified hypothyroidism: Secondary | ICD-10-CM

## 2014-03-24 DIAGNOSIS — M75122 Complete rotator cuff tear or rupture of left shoulder, not specified as traumatic: Secondary | ICD-10-CM

## 2014-03-24 DIAGNOSIS — I10 Essential (primary) hypertension: Secondary | ICD-10-CM

## 2014-03-24 MED ORDER — TRAMADOL HCL 50 MG PO TABS: 50.0000 mg | ORAL_TABLET | Freq: Two times a day (BID) | ORAL | Status: DC | PRN

## 2014-03-24 NOTE — Progress Notes (Signed)
Pre visit review using our clinic review tool, if applicable. No additional management support is needed unless otherwise documented below in the visit note. 

## 2014-03-24 NOTE — Progress Notes (Signed)
Subjective:    Patient ID: Connor Miller, male    DOB: May 29, 1923, 80 y.o.   MRN: [id number]  Arthritis Presents for follow-up visit. The disease course has been fluctuating. The condition has lasted for 3 years. He complains of pain and joint warmth. He reports no stiffness or joint swelling. Affected locations include the neck, right shoulder, left knee and right knee. His pain is at a severity of 4/10. Associated symptoms include pain at night and pain while resting. Pertinent negatives include no diarrhea, dry eyes, dry mouth, dysuria, fatigue, fever, rash, Raynaud's syndrome, uveitis or weight loss. His past medical history is significant for osteoarthritis. His pertinent risk factors include overuse. Past treatments include NSAIDs. The treatment provided moderate relief. Factors aggravating his arthritis include activity. Compliance with prior treatments has been variable. Prior compliance problems include difficulty understanding directions.      Review of Systems  Constitutional: Negative.  Negative for fever, chills, weight loss, diaphoresis, appetite change and fatigue.  HENT: Negative.   Eyes: Negative.   Respiratory: Negative.  Negative for cough, choking, chest tightness, shortness of breath and stridor.   Cardiovascular: Negative.  Negative for chest pain, palpitations and leg swelling.  Gastrointestinal: Negative.  Negative for nausea, abdominal pain, diarrhea, constipation and blood in stool.  Endocrine: Negative.   Genitourinary: Negative.  Negative for dysuria.  Musculoskeletal: Positive for arthralgias, arthritis, back pain and neck pain. Negative for gait problem, joint swelling, myalgias, neck stiffness and stiffness.  Skin: Negative.  Negative for pallor and rash.  Allergic/Immunologic: Negative.   Neurological: Negative.   Hematological: Negative.  Negative for adenopathy. Does not bruise/bleed easily.  Psychiatric/Behavioral: Negative.        Objective:   Physical Exam  Vitals reviewed. Constitutional: He is oriented to person, place, and time. He appears cachectic. No distress.  HENT:  Head: Normocephalic and atraumatic.  Mouth/Throat: Oropharynx is clear and moist. No oropharyngeal exudate.  Eyes: Conjunctivae are normal. Right eye exhibits no discharge. Left eye exhibits no discharge. No scleral icterus.  Neck: Normal range of motion. Neck supple. No JVD present. No tracheal deviation present. No thyromegaly present.  Cardiovascular: Normal rate, regular rhythm, normal heart sounds and intact distal pulses.  Exam reveals no gallop and no friction rub.   No murmur heard. Pulmonary/Chest: Effort normal and breath sounds normal. No stridor. No respiratory distress. He has no wheezes. He has no rales. He exhibits no tenderness.  Abdominal: Soft. Bowel sounds are normal. He exhibits no distension and no mass. There is no tenderness. There is no rebound and no guarding.  Musculoskeletal: He exhibits no edema.       Right shoulder: He exhibits decreased range of motion, tenderness and bony tenderness. He exhibits no swelling, no effusion, no crepitus, no deformity, no laceration, no pain, no spasm, normal pulse and normal strength.       Right knee: Normal.       Cervical back: He exhibits decreased range of motion. He exhibits no tenderness, no bony tenderness, no swelling, no edema, no deformity, no laceration, no pain and no spasm.       Lumbar back: Normal. He exhibits normal range of motion, no tenderness and no bony tenderness.  Lymphadenopathy:    He has no cervical adenopathy.  Neurological: He is oriented to person, place, and time.  Skin: Skin is warm and dry. No rash noted. He is not diaphoretic. No erythema. No pallor.     Lab Results  Component Value Date  WBC 4.1 03/21/2014   HGB 12.2* 03/21/2014   HCT 36.0* 03/21/2014   PLT 185 03/21/2014   GLUCOSE 91 03/21/2014   CHOL 154 02/08/2014   TRIG 94.0 02/08/2014   HDL 51.30 02/08/2014    LDLCALC 84 02/08/2014   ALT 12 03/21/2014   AST 18 03/21/2014   NA 135* 03/21/2014   K 4.0 03/21/2014   CL 96 03/21/2014   CREATININE 1.10 03/21/2014   BUN 21 03/21/2014   CO2 29 03/21/2014   TSH 2.93 02/08/2014   PSA 2.32 05/07/2011   INR 1.11 03/21/2014   HGBA1C 5.5 05/07/2011       Assessment & Plan:

## 2014-03-24 NOTE — Patient Instructions (Signed)

## 2014-03-26 NOTE — Assessment & Plan Note (Signed)
Will try tramadol for the pain 

## 2014-03-26 NOTE — Assessment & Plan Note (Signed)
His BP is well controlled 

## 2014-03-26 NOTE — Assessment & Plan Note (Signed)
His recent TSH was in the normal range He will start on the current dose of synthroid

## 2014-03-27 ENCOUNTER — Telehealth: Payer: Self-pay | Admitting: Internal Medicine

## 2014-03-27 NOTE — Telephone Encounter (Signed)
emmi mailed  °

## 2014-04-05 ENCOUNTER — Other Ambulatory Visit: Payer: Self-pay | Admitting: Internal Medicine

## 2014-04-10 ENCOUNTER — Ambulatory Visit (INDEPENDENT_AMBULATORY_CARE_PROVIDER_SITE_OTHER): Payer: Medicare Other | Admitting: Geriatric Medicine

## 2014-04-10 DIAGNOSIS — E538 Deficiency of other specified B group vitamins: Secondary | ICD-10-CM

## 2014-04-10 MED ORDER — CYANOCOBALAMIN 1000 MCG/ML IJ SOLN
1000.0000 ug | Freq: Once | INTRAMUSCULAR | Status: AC
  Administered 2014-04-10: 1000 ug via INTRAMUSCULAR

## 2014-04-11 ENCOUNTER — Ambulatory Visit: Payer: Medicare Other

## 2014-05-10 ENCOUNTER — Ambulatory Visit: Payer: Medicare Other

## 2014-05-10 ENCOUNTER — Ambulatory Visit (INDEPENDENT_AMBULATORY_CARE_PROVIDER_SITE_OTHER): Payer: Medicare Other | Admitting: *Deleted

## 2014-05-10 DIAGNOSIS — D509 Iron deficiency anemia, unspecified: Secondary | ICD-10-CM

## 2014-05-10 DIAGNOSIS — D518 Other vitamin B12 deficiency anemias: Secondary | ICD-10-CM

## 2014-05-10 MED ORDER — CYANOCOBALAMIN 1000 MCG/ML IJ SOLN
1000.0000 ug | Freq: Once | INTRAMUSCULAR | Status: AC
  Administered 2014-05-10: 1000 ug via INTRAMUSCULAR

## 2014-06-05 ENCOUNTER — Ambulatory Visit: Payer: Medicare Other | Admitting: Podiatry

## 2014-06-06 ENCOUNTER — Ambulatory Visit: Payer: Medicare Other | Admitting: Internal Medicine

## 2014-06-07 ENCOUNTER — Ambulatory Visit: Payer: Medicare Other | Admitting: Internal Medicine

## 2014-06-19 ENCOUNTER — Ambulatory Visit (INDEPENDENT_AMBULATORY_CARE_PROVIDER_SITE_OTHER): Payer: 59 | Admitting: Podiatry

## 2014-06-19 DIAGNOSIS — B351 Tinea unguium: Secondary | ICD-10-CM

## 2014-06-19 DIAGNOSIS — M79673 Pain in unspecified foot: Secondary | ICD-10-CM

## 2014-06-19 NOTE — Progress Notes (Signed)
Subjective:     Patient ID: Connor Miller, male   DOB: 04/01/1923, 81 y.o.   MRN: [id number]  HPI patient presents with thick yellow nailbeds 1-5 both feet that can become painful and he cannot cut   Review of Systems     Objective:   Physical Exam Neurovascular status intact with thick yellow brittle nailbeds 1-5 both feet    Assessment:     Mycotic nail infection is with pain 1-5 both feet    Plan:     Debride painful nailbeds 1-5 both feet with no iatrogenic bleeding noted      

## 2014-06-23 ENCOUNTER — Ambulatory Visit (INDEPENDENT_AMBULATORY_CARE_PROVIDER_SITE_OTHER): Payer: Medicare Other | Admitting: *Deleted

## 2014-06-23 DIAGNOSIS — E538 Deficiency of other specified B group vitamins: Secondary | ICD-10-CM

## 2014-06-23 MED ORDER — CYANOCOBALAMIN 1000 MCG/ML IJ SOLN
1000.0000 ug | Freq: Once | INTRAMUSCULAR | Status: AC
  Administered 2014-06-23: 1000 ug via INTRAMUSCULAR

## 2014-07-10 ENCOUNTER — Other Ambulatory Visit: Payer: Self-pay | Admitting: Internal Medicine

## 2014-07-24 ENCOUNTER — Ambulatory Visit: Payer: Medicare Other

## 2014-08-08 ENCOUNTER — Ambulatory Visit (INDEPENDENT_AMBULATORY_CARE_PROVIDER_SITE_OTHER): Payer: Medicare Other

## 2014-08-08 DIAGNOSIS — E538 Deficiency of other specified B group vitamins: Secondary | ICD-10-CM

## 2014-08-08 MED ORDER — CYANOCOBALAMIN 1000 MCG/ML IJ SOLN
1000.0000 ug | Freq: Once | INTRAMUSCULAR | Status: AC
  Administered 2014-08-08: 1000 ug via INTRAMUSCULAR

## 2014-09-11 ENCOUNTER — Ambulatory Visit: Payer: Medicare Other

## 2014-09-13 ENCOUNTER — Emergency Department (HOSPITAL_COMMUNITY)
Admission: EM | Admit: 2014-09-13 | Discharge: 2014-09-13 | Disposition: A | Discharge status: Home / Self Care | Payer: Medicare Other | Attending: Emergency Medicine | Admitting: Emergency Medicine

## 2014-09-13 ENCOUNTER — Emergency Department (HOSPITAL_COMMUNITY): Payer: Medicare Other

## 2014-09-13 ENCOUNTER — Encounter (HOSPITAL_COMMUNITY): Payer: Self-pay | Admitting: Emergency Medicine

## 2014-09-13 DIAGNOSIS — S20219A Contusion of unspecified front wall of thorax, initial encounter: Secondary | ICD-10-CM | POA: Diagnosis not present

## 2014-09-13 DIAGNOSIS — Z86010 Personal history of colonic polyps: Secondary | ICD-10-CM | POA: Diagnosis not present

## 2014-09-13 DIAGNOSIS — Z87448 Personal history of other diseases of urinary system: Secondary | ICD-10-CM | POA: Diagnosis not present

## 2014-09-13 DIAGNOSIS — S299XXA Unspecified injury of thorax, initial encounter: Secondary | ICD-10-CM | POA: Diagnosis present

## 2014-09-13 DIAGNOSIS — W11XXXA Fall on and from ladder, initial encounter: Secondary | ICD-10-CM | POA: Insufficient documentation

## 2014-09-13 DIAGNOSIS — E039 Hypothyroidism, unspecified: Secondary | ICD-10-CM | POA: Insufficient documentation

## 2014-09-13 DIAGNOSIS — Z8719 Personal history of other diseases of the digestive system: Secondary | ICD-10-CM | POA: Insufficient documentation

## 2014-09-13 DIAGNOSIS — I10 Essential (primary) hypertension: Secondary | ICD-10-CM | POA: Diagnosis not present

## 2014-09-13 DIAGNOSIS — Y9389 Activity, other specified: Secondary | ICD-10-CM | POA: Insufficient documentation

## 2014-09-13 DIAGNOSIS — Y9289 Other specified places as the place of occurrence of the external cause: Secondary | ICD-10-CM | POA: Diagnosis not present

## 2014-09-13 DIAGNOSIS — Y998 Other external cause status: Secondary | ICD-10-CM | POA: Insufficient documentation

## 2014-09-13 DIAGNOSIS — Z791 Long term (current) use of non-steroidal anti-inflammatories (NSAID): Secondary | ICD-10-CM | POA: Insufficient documentation

## 2014-09-13 DIAGNOSIS — D649 Anemia, unspecified: Secondary | ICD-10-CM | POA: Insufficient documentation

## 2014-09-13 DIAGNOSIS — Z79899 Other long term (current) drug therapy: Secondary | ICD-10-CM | POA: Insufficient documentation

## 2014-09-13 MED ORDER — TRAMADOL HCL 50 MG PO TABS: 50.0000 mg | ORAL_TABLET | Freq: Four times a day (QID) | ORAL | Status: DC | PRN

## 2014-09-13 NOTE — Discharge Instructions (Signed)

## 2014-09-13 NOTE — ED Notes (Signed)
Molly patient's neighbor on way to pick patient up.

## 2014-09-13 NOTE — ED Notes (Addendum)
Pt from home via [facility] c/o falling about 3 feet from a ladder while trimming limbs. Pt denies hitting head or LOC. He is alert and oriented.  Pt c/o sternal pain only with movement.

## 2014-09-13 NOTE — ED Notes (Signed)
[facility]:  Expected time:  Means of arrival:  Comments: Ems- fall off a ladder 81 year old

## 2014-09-13 NOTE — ED Notes (Signed)
Pt transported to XRAY °

## 2014-09-18 ENCOUNTER — Ambulatory Visit (INDEPENDENT_AMBULATORY_CARE_PROVIDER_SITE_OTHER): Payer: Medicare Other | Admitting: Podiatry

## 2014-09-18 DIAGNOSIS — B351 Tinea unguium: Secondary | ICD-10-CM

## 2014-09-18 DIAGNOSIS — M79673 Pain in unspecified foot: Secondary | ICD-10-CM

## 2014-09-18 NOTE — Progress Notes (Signed)
Subjective:     Patient ID: Connor Miller, male   DOB: 05/03/1923, 81 y.o.   MRN: [id number]  HPI patient presents with thick yellow nailbeds 1-5 both feet that can become painful and he cannot cut   Review of Systems     Objective:   Physical Exam Neurovascular status intact with thick yellow brittle nailbeds 1-5 both feet    Assessment:     Mycotic nail infection is with pain 1-5 both feet    Plan:     Debride painful nailbeds 1-5 both feet with no iatrogenic bleeding noted

## 2014-09-20 NOTE — ED Provider Notes (Signed)
CSN: [id number]     Arrival date & time 09/13/14  1601 History   First MD Initiated Contact with Patient 09/13/14 1608     Chief Complaint  Patient presents with  . Fall     (Consider location/radiation/quality/duration/timing/severity/associated sxs/prior Treatment) HPI   81 year old male with sternal pain. Patient was coming down a ladder when he thinks he still has strong and he lost his balance. He ended up falling forward. Struck the front part of his body against the ground. Persistent pain in the center of her sternum since then. Denies any severe pain anywhere else. He had to be assisted up, but has been angulatory tenderness or power since then without any difficulty. She denies any shortness of breath. No headaches. Neck or back pain. No blood thinners.  Past Medical History  Diagnosis Date  . Anemia     NOS  . Hypertension   . Hypothyroidism   . Colon polyps   . BPH (benign prostatic hyperplasia)   . Diverticulosis    Past Surgical History  Procedure Laterality Date  . Eye surgery    . Hemorrhoid surgery  1972  . 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 Inguinal hernia repair  04/1995  . Colonoscopy      multiple   Family History  Problem Relation Age of Onset  . Lung cancer Father   . Lung cancer Brother   . Other Mother     aorta ruptured   History  Substance Use Topics  . Smoking status: Never Smoker   . Smokeless tobacco: Never Used     Comment: "few cigarettes when young"  . Alcohol Use: No     Comment: occasionally,     Review of Systems  All systems reviewed and negative, other than as noted in HPI.   Allergies  Review of patient's allergies indicates no known allergies.  Home Medications   Prior to Admission medications   Medication Sig Start Date End Date Taking? Authorizing Provider  celecoxib (CELEBREX) 200 MG capsule Take 1 capsule (200 mg total) by mouth daily. 10/19/13  Yes Etta Grandchildhomas L Jones, MD  Cyanocobalamin (B-12 IJ) Inject as  directed every 30 (thirty) days.   Yes Historical Provider, MD  dorzolamide-timolol (COSOPT) 22.3-6.8 MG/ML ophthalmic solution Place 1 drop into the left eye 2 (two) times daily.    Yes Historical Provider, MD  SYNTHROID 75 MCG tablet TAKE 1 TABLET DAILY Patient taking differently: take 1 tablet by mouth daily. 04/05/14  Yes Etta Grandchildhomas L Jones, MD  triamterene-hydrochlorothiazide (DYAZIDE) 37.5-25 MG per capsule TAKE 1 CAPSULE DAILY Patient taking differently: Take 1 capsule daily. 07/10/14  Yes Etta Grandchildhomas L Jones, MD  levothyroxine (SYNTHROID, LEVOTHROID) 75 MCG tablet Take 75 mcg by mouth daily before breakfast.    Historical Provider, MD  traMADol (ULTRAM) 50 MG tablet Take 1 tablet (50 mg total) by mouth every 12 (twelve) hours as needed. Patient not taking: Reported on 09/13/2014 03/24/14   Etta Grandchildhomas L Jones, MD  traMADol (ULTRAM) 50 MG tablet Take 1 tablet (50 mg total) by mouth every 6 (six) hours as needed. 09/13/14   Raeford RazorStephen Zyionna Pesce, MD  triamterene (DYRENIUM) 100 MG capsule Take 100 mg by mouth daily.    Historical Provider, MD   BP 134/69 mmHg  Pulse 69  Temp(Src) 98.2 F (36.8 C) (Oral)  Resp 18  SpO2 100% Physical Exam  Constitutional: He appears well-developed and well-nourished. No distress.  HENT:  Head: Normocephalic and atraumatic.  Eyes: Conjunctivae  are normal. Right eye exhibits no discharge. Left eye exhibits no discharge.  Neck: Neck supple.  Cardiovascular: Normal rate, regular rhythm and normal heart sounds.  Exam reveals no gallop and no friction rub.   No murmur heard. Pulmonary/Chest: Effort normal and breath sounds normal. No respiratory distress. He exhibits tenderness.  Mild tenderness palpation midsternum. Overlying skin changes. No crepitus. Chest rises symmetric breath sounds were equal.  Abdominal: Soft. He exhibits no distension. There is no tenderness.  Musculoskeletal: He exhibits no edema or tenderness.  No midline spinal tenderness  Neurological: He is alert.   Skin: Skin is warm and dry.  Psychiatric: He has a normal mood and affect. His behavior is normal. Thought content normal.  Nursing note and vitals reviewed.   ED Course  Procedures (including critical care time) Labs Review Labs Reviewed - No data to display  Imaging Review No results found.   EKG Interpretation None      MDM   Final diagnoses:  Sternal contusion, initial encounter    81 year old male with sternal pain after mechanical fall. No crepitus or deformity. Negative imaging. No respiratory distress. Patient's declining pain medication emergency room. He is prescribed tramadol to take as needed if he feels like it. Return precautions were discussed.    Raeford Razor, MD 09/20/14 1017

## 2014-10-11 ENCOUNTER — Other Ambulatory Visit: Payer: Self-pay | Admitting: Internal Medicine

## 2014-11-08 ENCOUNTER — Other Ambulatory Visit (INDEPENDENT_AMBULATORY_CARE_PROVIDER_SITE_OTHER): Payer: Medicare Other

## 2014-11-08 ENCOUNTER — Ambulatory Visit (INDEPENDENT_AMBULATORY_CARE_PROVIDER_SITE_OTHER): Payer: Medicare Other | Admitting: Internal Medicine

## 2014-11-08 ENCOUNTER — Encounter: Payer: Self-pay | Admitting: Internal Medicine

## 2014-11-08 VITALS — BP 118/62 | HR 71 | Temp 97.4°F | Resp 16 | Ht 70.0 in | Wt 128.8 lb

## 2014-11-08 DIAGNOSIS — D518 Other vitamin B12 deficiency anemias: Secondary | ICD-10-CM | POA: Diagnosis not present

## 2014-11-08 DIAGNOSIS — E038 Other specified hypothyroidism: Secondary | ICD-10-CM

## 2014-11-08 DIAGNOSIS — I10 Essential (primary) hypertension: Secondary | ICD-10-CM | POA: Diagnosis not present

## 2014-11-08 LAB — LIPID PANEL
Cholesterol: 148 mg/dL (ref 0–200)
HDL: 54.1 mg/dL (ref >39.00)
LDL Cholesterol: 79 mg/dL (ref 0–99)
NonHDL: 93.9
Total CHOL/HDL Ratio: 3
Triglycerides: 73 mg/dL (ref 0.0–149.0)
VLDL: 14.6 mg/dL (ref 0.0–40.0)

## 2014-11-08 LAB — BASIC METABOLIC PANEL
BUN: 27 mg/dL — ABNORMAL HIGH (ref 6–23)
CO2: 32 mEq/L (ref 19–32)
Calcium: 9.3 mg/dL (ref 8.4–10.5)
Chloride: 101 mEq/L (ref 96–112)
Creatinine, Ser: 1.31 mg/dL (ref 0.40–1.50)
GFR: 54.66 mL/min — ABNORMAL LOW (ref >60.00)
GLUCOSE: 87 mg/dL (ref 70–99)
POTASSIUM: 3.6 meq/L (ref 3.5–5.1)
SODIUM: 137 meq/L (ref 135–145)

## 2014-11-08 LAB — TSH: TSH: 0.86 u[IU]/mL (ref 0.35–4.50)

## 2014-11-08 MED ORDER — CYANOCOBALAMIN 1000 MCG/ML IJ SOLN: 1000.0000 ug | Freq: Once | INTRAMUSCULAR | Status: DC

## 2014-11-08 NOTE — Progress Notes (Signed)
Pre visit review using our clinic review tool, if applicable. No additional management support is needed unless otherwise documented below in the visit note. 

## 2014-11-08 NOTE — Patient Instructions (Signed)
Hypothyroidism The thyroid is a large gland located in the lower front of your neck. The thyroid gland helps control metabolism. Metabolism is how your body handles food. It controls metabolism with the hormone thyroxine. When this gland is underactive (hypothyroid), it produces too little hormone.  CAUSES These include:   Absence or destruction of thyroid tissue.  Goiter due to iodine deficiency.  Goiter due to medications.  Congenital defects (since birth).  Problems with the pituitary. This causes a lack of TSH (thyroid stimulating hormone). This hormone tells the thyroid to turn out more hormone. SYMPTOMS  Lethargy (feeling as though you have no energy)  Cold intolerance  Weight gain (in spite of normal food intake)  Dry skin  Coarse hair  Menstrual irregularity (if severe, may lead to infertility)  Slowing of thought processes Cardiac problems are also caused by insufficient amounts of thyroid hormone. Hypothyroidism in the newborn is cretinism, and is an extreme form. It is important that this form be treated adequately and immediately or it will lead rapidly to retarded physical and mental development. DIAGNOSIS  To prove hypothyroidism, your caregiver may do blood tests and ultrasound tests. Sometimes the signs are hidden. It may be necessary for your caregiver to watch this illness with blood tests either before or after diagnosis and treatment. TREATMENT  Low levels of thyroid hormone are increased by using synthetic thyroid hormone. This is a safe, effective treatment. It usually takes about four weeks to gain the full effects of the medication. After you have the full effect of the medication, it will generally take another four weeks for problems to leave. Your caregiver may start you on low doses. If you have had heart problems the dose may be gradually increased. It is generally not an emergency to get rapidly to normal. HOME CARE INSTRUCTIONS   Take your  medications as your caregiver suggests. Let your caregiver know of any medications you are taking or start taking. Your caregiver will help you with dosage schedules.  As your condition improves, your dosage needs may increase. It will be necessary to have continuing blood tests as suggested by your caregiver.  Report all suspected medication side effects to your caregiver. SEEK MEDICAL CARE IF: Seek medical care if you develop:  Sweating.  Tremulousness (tremors).  Anxiety.  Rapid weight loss.  Heat intolerance.  Emotional swings.  Diarrhea.  Weakness. SEEK IMMEDIATE MEDICAL CARE IF:  You develop chest pain, an irregular heart beat (palpitations), or a rapid heart beat. MAKE SURE YOU:   Understand these instructions.  Will watch your condition.  Will get help right away if you are not doing well or get worse. Document Released: 05/26/2005 Document Revised: 08/18/2011 Document Reviewed: 01/14/2008 Exit[employer] Patient Information 2015 [employer]. This information is not intended to replace advice given to you by your health care provider. Make sure you discuss any questions you have with your health care provider.  

## 2014-11-09 ENCOUNTER — Encounter: Payer: Self-pay | Admitting: Internal Medicine

## 2014-11-09 NOTE — Progress Notes (Signed)
Subjective:  Patient ID: Connor Miller, male    DOB: 1922-08-08  Age: 81 y.o. MRN: [id number]  CC: Hypothyroidism   HPI Connor Miller presents for a thyroid check, he feels well and offers no new complaints.  Outpatient Prescriptions Prior to Visit  Medication Sig Dispense Refill  . celecoxib (CELEBREX) 200 MG capsule Take 1 capsule (200 mg total) by mouth daily. 90 capsule 2  . Cyanocobalamin (B-12 IJ) Inject as directed every 30 (thirty) days.    . dorzolamide-timolol (COSOPT) 22.3-6.8 MG/ML ophthalmic solution Place 1 drop into the left eye 2 (two) times daily.     Marland Kitchen levothyroxine (SYNTHROID, LEVOTHROID) 75 MCG tablet Take 75 mcg by mouth daily before breakfast.    . SYNTHROID 75 MCG tablet TAKE 1 TABLET DAILY 90 tablet 1  . traMADol (ULTRAM) 50 MG tablet Take 1 tablet (50 mg total) by mouth every 12 (twelve) hours as needed. 60 tablet 5  . traMADol (ULTRAM) 50 MG tablet Take 1 tablet (50 mg total) by mouth every 6 (six) hours as needed. 15 tablet 0  . triamterene (DYRENIUM) 100 MG capsule Take 100 mg by mouth daily.    Marland Kitchen triamterene-hydrochlorothiazide (DYAZIDE) 37.5-25 MG per capsule TAKE 1 CAPSULE DAILY (Patient taking differently: Take 1 capsule daily.) 90 capsule 1   No facility-administered medications prior to visit.    ROS Review of Systems  Constitutional: Negative.  Negative for fever, chills, diaphoresis, appetite change and fatigue.  HENT: Negative.   Eyes: Negative.   Respiratory: Negative.  Negative for cough, choking, chest tightness, shortness of breath and stridor.   Cardiovascular: Negative.  Negative for chest pain, palpitations and leg swelling.  Gastrointestinal: Negative.  Negative for nausea, vomiting, abdominal pain, diarrhea, constipation and blood in stool.  Endocrine: Negative.   Genitourinary: Negative.   Musculoskeletal: Positive for back pain and arthralgias. Negative for myalgias, joint swelling, gait problem, neck pain and neck stiffness.    Skin: Negative.   Allergic/Immunologic: Negative.   Neurological: Negative.  Negative for dizziness, syncope, speech difficulty, light-headedness, numbness and headaches.  Hematological: Negative.  Negative for adenopathy. Does not bruise/bleed easily.  Psychiatric/Behavioral: Negative.     Objective:  BP 118/62 mmHg  Pulse 71  Temp(Src) 97.4 F (36.3 C) (Oral)  Resp 16  Ht  (1.778 m)  Wt 128 lb 12 oz (58.401 kg)  BMI 18.47 kg/m2  SpO2 96%  BP Readings from Last 3 Encounters:  11/08/14 118/62  09/13/14 134/69  03/24/14 136/74    Wt Readings from Last 3 Encounters:  11/08/14 128 lb 12 oz (58.401 kg)  03/24/14 129 lb (58.514 kg)  03/21/14 132 lb (59.875 kg)    Physical Exam  Constitutional: He is oriented to person, place, and time. He appears well-developed and well-nourished. No distress.  HENT:  Head: Normocephalic and atraumatic.  Mouth/Throat: Oropharynx is clear and moist. No oropharyngeal exudate.  Eyes: Conjunctivae are normal. Right eye exhibits no discharge. Left eye exhibits no discharge. No scleral icterus.  Neck: Normal range of motion. Neck supple. No JVD present. No tracheal deviation present. No thyromegaly present.  Cardiovascular: Normal rate, regular rhythm, normal heart sounds and intact distal pulses.  Exam reveals no gallop and no friction rub.   No murmur heard. Pulmonary/Chest: Effort normal and breath sounds normal. No stridor. No respiratory distress. He has no wheezes. He has no rales. He exhibits no tenderness.  Abdominal: Soft. Bowel sounds are normal. He exhibits no distension and no mass. There is  no tenderness. There is no rebound and no guarding.  Musculoskeletal: Normal range of motion. He exhibits no edema or tenderness.  Lymphadenopathy:    He has no cervical adenopathy.  Neurological: He is oriented to person, place, and time.  Skin: Skin is warm and dry. No rash noted. He is not diaphoretic. No erythema. No pallor.   Psychiatric: He has a normal mood and affect. His behavior is normal. Judgment and thought content normal.  Vitals reviewed.   Lab Results  Component Value Date   WBC 4.1 03/21/2014   HGB 12.2* 03/21/2014   HCT 36.0* 03/21/2014   PLT 185 03/21/2014   GLUCOSE 87 11/08/2014   CHOL 148 11/08/2014   TRIG 73.0 11/08/2014   HDL 54.10 11/08/2014   LDLCALC 79 11/08/2014   ALT 12 03/21/2014   AST 18 03/21/2014   NA 137 11/08/2014   K 3.6 11/08/2014   CL 101 11/08/2014   CREATININE 1.31 11/08/2014   BUN 27* 11/08/2014   CO2 32 11/08/2014   TSH 0.86 11/08/2014   PSA 2.32 05/07/2011   INR 1.11 03/21/2014   HGBA1C 5.5 05/07/2011    Dg Sternum  09/13/2014   CLINICAL DATA:  Initial encounter for fall from ladder today and landed on chest. Midsternal chest pain.  EXAM: STERNUM - 2+ VIEW  COMPARISON:  07/09/2011.  FINDINGS: Lungs are hyperexpanded with underlying chronic interstitial coarsening. Frontal film shows no evidence for pneumothorax, focal airspace consolidation, or pleural effusion. Lateral film shows some irregularity in the sternum, but this does not appear substantially different than the previous study. No retrosternal opacities suggest the presence of a retrosternal hematoma. Bones are diffusely demineralized.  IMPRESSION: No definite evidence for displaced sternal fracture.   Electronically Signed   By: Kennith CenterEric  Mansell M.D.   On: 09/13/2014 17:45    Assessment & Plan:   Connor Miller was seen today for hypothyroidism.  Diagnoses and all orders for this visit:  Essential hypertension, benign - his BP is well controlled, lytes and renal function are stable Orders: -     Basic metabolic panel; Future  Other specified hypothyroidism - his TSh is in the normal range, will cont the current synthroid dose Orders: -     Lipid panel; Future -     TSH; Future  ANEMIA, B12 DEFICIENCY Orders: -     cyanocobalamin ((VITAMIN B-12)) injection 1,000 mcg; Inject 1 mL (1,000 mcg total) into  the muscle once.   I am having Connor Miller maintain his dorzolamide-timolol, celecoxib, triamterene, levothyroxine, traMADol, triamterene-hydrochlorothiazide, Cyanocobalamin (B-12 IJ), traMADol, and SYNTHROID. We will continue to administer cyanocobalamin.  Meds ordered this encounter  Medications  . cyanocobalamin ((VITAMIN B-12)) injection 1,000 mcg    Sig:      Follow-up: Return in about 6 months (around 05/10/2015).  Sanda Lingerhomas Takima [place], MD

## 2014-12-12 ENCOUNTER — Ambulatory Visit (INDEPENDENT_AMBULATORY_CARE_PROVIDER_SITE_OTHER): Payer: Medicare Other

## 2014-12-12 DIAGNOSIS — D649 Anemia, unspecified: Secondary | ICD-10-CM

## 2014-12-12 MED ORDER — CYANOCOBALAMIN 1000 MCG/ML IJ SOLN
1000.0000 ug | Freq: Once | INTRAMUSCULAR | Status: AC
  Administered 2014-12-12: 1000 ug via SUBCUTANEOUS

## 2014-12-14 ENCOUNTER — Encounter: Payer: Self-pay | Admitting: Internal Medicine

## 2015-01-09 ENCOUNTER — Other Ambulatory Visit: Payer: Self-pay | Admitting: Internal Medicine

## 2015-01-15 ENCOUNTER — Ambulatory Visit: Payer: Medicare Other

## 2015-01-24 ENCOUNTER — Ambulatory Visit (INDEPENDENT_AMBULATORY_CARE_PROVIDER_SITE_OTHER): Payer: Medicare Other | Admitting: Podiatry

## 2015-01-24 ENCOUNTER — Encounter: Payer: Self-pay | Admitting: Podiatry

## 2015-01-24 DIAGNOSIS — B351 Tinea unguium: Secondary | ICD-10-CM | POA: Diagnosis not present

## 2015-01-24 DIAGNOSIS — M79676 Pain in unspecified toe(s): Secondary | ICD-10-CM | POA: Diagnosis not present

## 2015-01-25 NOTE — Progress Notes (Signed)
Patient ID: Connor Miller, male   DOB: 1922-07-31, 81 y.o.   MRN: [id number]  This patient presents today requesting debridement of toenails that he said are uncomfortable and is unable to manage himself. Last visit for a similar service was 03/13/2014  Objective: Patient appears orientated 3 The toenails are elongated, incurvated, hypertrophic, discolored and tender direct palpation 6-10  Assessment: Symptomatic onychomycoses 6-10  Plan: Debridement of toenails 10 mechanically and electrically without any bleeding  Reappoint intervals recommended at 4 months

## 2015-02-08 ENCOUNTER — Ambulatory Visit (INDEPENDENT_AMBULATORY_CARE_PROVIDER_SITE_OTHER)
Admission: RE | Admit: 2015-02-08 | Discharge: 2015-02-08 | Disposition: A | Discharge status: Home / Self Care | Payer: Medicare Other | Source: Ambulatory Visit | Attending: Internal Medicine | Admitting: Internal Medicine

## 2015-02-08 ENCOUNTER — Ambulatory Visit (INDEPENDENT_AMBULATORY_CARE_PROVIDER_SITE_OTHER): Payer: Medicare Other | Admitting: Internal Medicine

## 2015-02-08 ENCOUNTER — Encounter: Payer: Self-pay | Admitting: Internal Medicine

## 2015-02-08 VITALS — BP 108/72 | HR 70 | Temp 98.1°F | Resp 16 | Ht 70.0 in | Wt 127.0 lb

## 2015-02-08 DIAGNOSIS — M75122 Complete rotator cuff tear or rupture of left shoulder, not specified as traumatic: Secondary | ICD-10-CM

## 2015-02-08 DIAGNOSIS — M5440 Lumbago with sciatica, unspecified side: Secondary | ICD-10-CM

## 2015-02-08 DIAGNOSIS — M545 Low back pain, unspecified: Secondary | ICD-10-CM | POA: Insufficient documentation

## 2015-02-08 DIAGNOSIS — Z23 Encounter for immunization: Secondary | ICD-10-CM

## 2015-02-08 DIAGNOSIS — M25551 Pain in right hip: Secondary | ICD-10-CM | POA: Insufficient documentation

## 2015-02-08 DIAGNOSIS — M25552 Pain in left hip: Secondary | ICD-10-CM | POA: Diagnosis not present

## 2015-02-08 DIAGNOSIS — D518 Other vitamin B12 deficiency anemias: Secondary | ICD-10-CM

## 2015-02-08 DIAGNOSIS — I10 Essential (primary) hypertension: Secondary | ICD-10-CM | POA: Diagnosis not present

## 2015-02-08 HISTORY — DX: Low back pain, unspecified: M54.50

## 2015-02-08 MED ORDER — BUPRENORPHINE 7.5 MCG/HR TD PTWK: 1.0000 | MEDICATED_PATCH | TRANSDERMAL | Status: DC

## 2015-02-08 MED ORDER — CYANOCOBALAMIN 1000 MCG/ML IJ SOLN
1000.0000 ug | Freq: Once | INTRAMUSCULAR | Status: AC
  Administered 2015-02-08: 1000 ug via INTRAMUSCULAR

## 2015-02-08 MED ORDER — CELECOXIB 200 MG PO CAPS: 200.0000 mg | ORAL_CAPSULE | Freq: Every day | ORAL | Status: DC

## 2015-02-08 NOTE — Progress Notes (Signed)
Pre visit review using our clinic review tool, if applicable. No additional management support is needed unless otherwise documented below in the visit note. 

## 2015-02-08 NOTE — Progress Notes (Signed)
Subjective:  Patient ID: ABBIE JABLON, male    DOB: December 28, 1922  Age: 81 y.o. MRN: [id number]  CC: Back Pain and Osteoarthritis   HPI JESSIE SCHRIEBER presents for chronic aching pain in his hips and low back. This is been a problem for him over the last few years. He was prescribed tramadol but says he doesn't like taking pills. He was also given Celebrex but says he doesn't take it very much. The aching interferes with his activities of daily living and his ability to do his yard work. There is no numbness/weakness/tingling in his lower extremities.  Outpatient Prescriptions Prior to Visit  Medication Sig Dispense Refill  . Cyanocobalamin (B-12 IJ) Inject as directed every 30 (thirty) days.    . dorzolamide-timolol (COSOPT) 22.3-6.8 MG/ML ophthalmic solution Place 1 drop into the left eye 2 (two) times daily.     Marland Kitchen SYNTHROID 75 MCG tablet TAKE 1 TABLET DAILY 90 tablet 1  . triamterene (DYRENIUM) 100 MG capsule Take 100 mg by mouth daily.    Marland Kitchen triamterene-hydrochlorothiazide (DYAZIDE) 37.5-25 MG per capsule TAKE 1 CAPSULE DAILY 90 capsule 1  . celecoxib (CELEBREX) 200 MG capsule Take 1 capsule (200 mg total) by mouth daily. 90 capsule 2  . levothyroxine (SYNTHROID, LEVOTHROID) 75 MCG tablet Take 75 mcg by mouth daily before breakfast.    . traMADol (ULTRAM) 50 MG tablet Take 1 tablet (50 mg total) by mouth every 12 (twelve) hours as needed. (Patient not taking: Reported on 02/08/2015) 60 tablet 5  . traMADol (ULTRAM) 50 MG tablet Take 1 tablet (50 mg total) by mouth every 6 (six) hours as needed. (Patient not taking: Reported on 02/08/2015) 15 tablet 0   No facility-administered medications prior to visit.    ROS Review of Systems  Constitutional: Negative.  Negative for fever, chills, diaphoresis, appetite change and fatigue.  HENT: Negative.   Eyes: Negative.   Respiratory: Negative.  Negative for cough, choking, chest tightness, shortness of breath and stridor.   Cardiovascular:  Negative.  Negative for chest pain, palpitations and leg swelling.  Gastrointestinal: Negative.  Negative for nausea, vomiting, abdominal pain, diarrhea, constipation and blood in stool.  Endocrine: Negative.   Genitourinary: Negative.   Musculoskeletal: Positive for back pain and arthralgias. Negative for myalgias, joint swelling, gait problem, neck pain and neck stiffness.  Skin: Negative.  Negative for rash.  Allergic/Immunologic: Negative.   Neurological: Negative.  Negative for dizziness, tremors, weakness, light-headedness and numbness.  Hematological: Negative.   Psychiatric/Behavioral: Negative.     Objective:  BP 108/72 mmHg  Pulse 70  Temp(Src) 98.1 F (36.7 C) (Oral)  Resp 16  Ht 5\' 10"  (1.778 m)  Wt 127 lb (57.607 kg)  BMI 18.22 kg/m2  SpO2 98%  BP Readings from Last 3 Encounters:  02/08/15 108/72  11/08/14 118/62  09/13/14 134/69    Wt Readings from Last 3 Encounters:  02/08/15 127 lb (57.607 kg)  11/08/14 128 lb 12 oz (58.401 kg)  03/24/14 129 lb (58.514 kg)    Physical Exam  Constitutional: He is oriented to person, place, and time. No distress.  HENT:  Mouth/Throat: Oropharynx is clear and moist. No oropharyngeal exudate.  Eyes: Conjunctivae are normal. Right eye exhibits no discharge. Left eye exhibits no discharge. No scleral icterus.  Neck: Normal range of motion. Neck supple. No JVD present. No tracheal deviation present. No thyromegaly present.  Cardiovascular: Normal rate, regular rhythm, normal heart sounds and intact distal pulses.  Exam reveals no gallop and  no friction rub.   No murmur heard. Pulmonary/Chest: Effort normal and breath sounds normal. No stridor. No respiratory distress. He has no wheezes. He has no rales. He exhibits no tenderness.  Abdominal: Soft. Bowel sounds are normal. He exhibits no distension and no mass. There is no tenderness. There is no rebound and no guarding.  Musculoskeletal: He exhibits no edema or tenderness.        Right hip: He exhibits decreased range of motion. He exhibits normal strength, no tenderness, no bony tenderness, no swelling and no deformity.       Left hip: He exhibits decreased range of motion. He exhibits normal strength, no tenderness, no bony tenderness, no swelling and no deformity.       Lumbar back: He exhibits decreased range of motion. He exhibits no tenderness, no bony tenderness, no swelling, no edema and no deformity.  Lymphadenopathy:    He has no cervical adenopathy.  Neurological: He is alert and oriented to person, place, and time. He displays atrophy (symmetrical in bilateral lower extremities). He displays no tremor and normal reflexes. No cranial nerve deficit or sensory deficit. He exhibits normal muscle tone. He displays no seizure activity. Gait (mild ataxia) abnormal. Coordination normal.  Reflex Scores:      Tricep reflexes are 0 on the right side and 0 on the left side.      Bicep reflexes are 0 on the right side and 0 on the left side.      Brachioradialis reflexes are 0 on the right side and 0 on the left side.      Patellar reflexes are 0 on the right side and 0 on the left side.      Achilles reflexes are 0 on the right side and 0 on the left side. Negative straight leg raise in bilateral lower extremity  Skin: Skin is warm and dry. No rash noted. He is not diaphoretic. No erythema. No pallor.  Vitals reviewed.   Lab Results  Component Value Date   WBC 4.1 03/21/2014   HGB 12.2* 03/21/2014   HCT 36.0* 03/21/2014   PLT 185 03/21/2014   GLUCOSE 87 11/08/2014   CHOL 148 11/08/2014   TRIG 73.0 11/08/2014   HDL 54.10 11/08/2014   LDLCALC 79 11/08/2014   ALT 12 03/21/2014   AST 18 03/21/2014   NA 137 11/08/2014   K 3.6 11/08/2014   CL 101 11/08/2014   CREATININE 1.31 11/08/2014   BUN 27* 11/08/2014   CO2 32 11/08/2014   TSH 0.86 11/08/2014   PSA 2.32 05/07/2011   INR 1.11 03/21/2014   HGBA1C 5.5 05/07/2011    Dg Sternum  09/13/2014   CLINICAL DATA:   Initial encounter for fall from ladder today and landed on chest. Midsternal chest pain.  EXAM: STERNUM - 2+ VIEW  COMPARISON:  07/09/2011.  FINDINGS: Lungs are hyperexpanded with underlying chronic interstitial coarsening. Frontal film shows no evidence for pneumothorax, focal airspace consolidation, or pleural effusion. Lateral film shows some irregularity in the sternum, but this does not appear substantially different than the previous study. No retrosternal opacities suggest the presence of a retrosternal hematoma. Bones are diffusely demineralized.  IMPRESSION: No definite evidence for displaced sternal fracture.   Electronically Signed   By: Kennith Center M.D.   On: 09/13/2014 17:45    Assessment & Plan:   Deniel was seen today for back pain and osteoarthritis.  Diagnoses and all orders for this visit:  Essential hypertension, benign- his blood pressures  well controlled  ANEMIA, B12 DEFICIENCY- he was given a B12 injection today. -     cyanocobalamin ((VITAMIN B-12)) injection 1,000 mcg; Inject 1 mL (1,000 mcg total) into the muscle once. -     CBC with Differential/Platelet; Future  Need for prophylactic vaccination and inoculation against influenza -     Flu vaccine HIGH DOSE PF  Midline low back pain with sciatica, sciatica laterality unspecified- x-ray showed degenerative disease as expected, there are no lytic lesions. I've asked him to start wearing a Butrans patch for pain relief and to take Celebrex more consistently. -     DG Lumbar Spine Complete; Future -     Buprenorphine (BUTRANS) 7.5 MCG/HR PTWK; Place 1 patch onto the skin once a week. -     celecoxib (CELEBREX) 200 MG capsule; Take 1 capsule (200 mg total) by mouth daily.  Hip pain, bilateral- plain films showed tender to changes but no lytic lesions. I've asked him to start wearing Butrans patch for pain relief and to take Celebrex more consistently. -     DG HIPS BILAT WITH PELVIS MIN 5 VIEWS; Future -      Buprenorphine (BUTRANS) 7.5 MCG/HR PTWK; Place 1 patch onto the skin once a week. -     celecoxib (CELEBREX) 200 MG capsule; Take 1 capsule (200 mg total) by mouth daily.  Complete rotator cuff tear of left shoulder   I have discontinued Mr. Micallef's levothyroxine, traMADol, and traMADol. I am also having him start on Buprenorphine. Additionally, I am having him maintain his dorzolamide-timolol, triamterene, Cyanocobalamin (B-12 IJ), SYNTHROID, triamterene-hydrochlorothiazide, and celecoxib. We administered cyanocobalamin.  Meds ordered this encounter  Medications  . cyanocobalamin ((VITAMIN B-12)) injection 1,000 mcg    Sig:   . Buprenorphine (BUTRANS) 7.5 MCG/HR PTWK    Sig: Place 1 patch onto the skin once a week.    Dispense:  4 patch    Refill:  5  . celecoxib (CELEBREX) 200 MG capsule    Sig: Take 1 capsule (200 mg total) by mouth daily.    Dispense:  90 capsule    Refill:  2     Follow-up: Return in about 3 months (around 05/10/2015).  Sanda Linger, MD

## 2015-02-08 NOTE — Patient Instructions (Signed)
Back Pain, Adult Low back pain is very common. About 1 in 5 people have back pain.The cause of low back pain is rarely dangerous. The pain often gets better over time.About half of people with a sudden onset of back pain feel better in just 2 weeks. About 8 in 10 people feel better by 6 weeks.  CAUSES Some common causes of back pain include:  Strain of the muscles or ligaments supporting the spine.  Wear and tear (degeneration) of the spinal discs.  Arthritis.  Direct injury to the back. DIAGNOSIS Most of the time, the direct cause of low back pain is not known.However, back pain can be treated effectively even when the exact cause of the pain is unknown.Answering your caregiver's questions about your overall health and symptoms is one of the most accurate ways to make sure the cause of your pain is not dangerous. If your caregiver needs more information, he or she may order lab work or imaging tests (X-rays or MRIs).However, even if imaging tests show changes in your back, this usually does not require surgery. HOME CARE INSTRUCTIONS For many people, back pain returns.Since low back pain is rarely dangerous, it is often a condition that people can learn to manageon their own.   Remain active. It is stressful on the back to sit or stand in one place. Do not sit, drive, or stand in one place for more than 30 minutes at a time. Take short walks on level surfaces as soon as pain allows.Try to increase the length of time you walk each day.  Do not stay in bed.Resting more than 1 or 2 days can delay your recovery.  Do not avoid exercise or work.Your body is made to move.It is not dangerous to be active, even though your back may hurt.Your back will likely heal faster if you return to being active before your pain is gone.  Pay attention to your body when you bend and lift. Many people have less discomfortwhen lifting if they bend their knees, keep the load close to their bodies,and  avoid twisting. Often, the most comfortable positions are those that put less stress on your recovering back.  Find a comfortable position to sleep. Use a firm mattress and lie on your side with your knees slightly bent. If you lie on your back, put a pillow under your knees.  Only take over-the-counter or prescription medicines as directed by your caregiver. Over-the-counter medicines to reduce pain and inflammation are often the most helpful.Your caregiver may prescribe muscle relaxant drugs.These medicines help dull your pain so you can more quickly return to your normal activities and healthy exercise.  Put ice on the injured area.  Put ice in a plastic bag.  Place a towel between your skin and the bag.  Leave the ice on for 15-20 minutes, 03-04 times a day for the first 2 to 3 days. After that, ice and heat may be alternated to reduce pain and spasms.  Ask your caregiver about trying back exercises and gentle massage. This may be of some benefit.  Avoid feeling anxious or stressed.Stress increases muscle tension and can worsen back pain.It is important to recognize when you are anxious or stressed and learn ways to manage it.Exercise is a great option. SEEK MEDICAL CARE IF:  You have pain that is not relieved with rest or medicine.  You have pain that does not improve in 1 week.  You have new symptoms.  You are generally not feeling well. SEEK   IMMEDIATE MEDICAL CARE IF:   You have pain that radiates from your back into your legs.  You develop new bowel or bladder control problems.  You have unusual weakness or numbness in your arms or legs.  You develop nausea or vomiting.  You develop abdominal pain.  You feel faint. Document Released: 05/26/2005 Document Revised: 11/25/2011 Document Reviewed: 09/27/2013 [employer] Patient Information 2015 [employer]. This information is not intended to replace advice given to you by your health care provider. Make sure you  discuss any questions you have with your health care provider.  

## 2015-02-13 ENCOUNTER — Telehealth: Payer: Self-pay | Admitting: *Deleted

## 2015-02-13 DIAGNOSIS — M25551 Pain in right hip: Secondary | ICD-10-CM

## 2015-02-13 DIAGNOSIS — M5440 Lumbago with sciatica, unspecified side: Secondary | ICD-10-CM

## 2015-02-13 DIAGNOSIS — M25552 Pain in left hip: Secondary | ICD-10-CM

## 2015-02-13 MED ORDER — BUPRENORPHINE 7.5 MCG/HR TD PTWK: 1.0000 | MEDICATED_PATCH | TRANSDERMAL | Status: DC

## 2015-02-13 NOTE — Telephone Encounter (Signed)
Rx re-written.

## 2015-02-13 NOTE — Telephone Encounter (Signed)
Left msg on triage stating saw md last week and he gave rx for patches. Pt has lost prescription wanting md to re-write script...Raechel Chute

## 2015-02-13 NOTE — Telephone Encounter (Signed)
Notified pt md re-wrote script. Verified pharmacy he use will fax script to [place]...Connor Miller

## 2015-02-27 ENCOUNTER — Encounter (HOSPITAL_COMMUNITY): Payer: Self-pay | Admitting: Emergency Medicine

## 2015-02-27 ENCOUNTER — Emergency Department (HOSPITAL_COMMUNITY): Payer: Medicare Other

## 2015-02-27 ENCOUNTER — Emergency Department (HOSPITAL_COMMUNITY)
Admission: EM | Admit: 2015-02-27 | Discharge: 2015-02-28 | Disposition: A | Discharge status: Home / Self Care | Payer: Medicare Other | Attending: Emergency Medicine | Admitting: Emergency Medicine

## 2015-02-27 DIAGNOSIS — Z791 Long term (current) use of non-steroidal anti-inflammatories (NSAID): Secondary | ICD-10-CM | POA: Diagnosis not present

## 2015-02-27 DIAGNOSIS — S41102A Unspecified open wound of left upper arm, initial encounter: Secondary | ICD-10-CM | POA: Diagnosis not present

## 2015-02-27 DIAGNOSIS — Z87438 Personal history of other diseases of male genital organs: Secondary | ICD-10-CM | POA: Insufficient documentation

## 2015-02-27 DIAGNOSIS — Y9389 Activity, other specified: Secondary | ICD-10-CM | POA: Insufficient documentation

## 2015-02-27 DIAGNOSIS — S0001XA Abrasion of scalp, initial encounter: Secondary | ICD-10-CM | POA: Insufficient documentation

## 2015-02-27 DIAGNOSIS — T148 Other injury of unspecified body region: Secondary | ICD-10-CM | POA: Insufficient documentation

## 2015-02-27 DIAGNOSIS — Z86010 Personal history of colonic polyps: Secondary | ICD-10-CM | POA: Diagnosis not present

## 2015-02-27 DIAGNOSIS — W19XXXA Unspecified fall, initial encounter: Secondary | ICD-10-CM

## 2015-02-27 DIAGNOSIS — Z8719 Personal history of other diseases of the digestive system: Secondary | ICD-10-CM | POA: Insufficient documentation

## 2015-02-27 DIAGNOSIS — D649 Anemia, unspecified: Secondary | ICD-10-CM | POA: Diagnosis not present

## 2015-02-27 DIAGNOSIS — Z23 Encounter for immunization: Secondary | ICD-10-CM | POA: Insufficient documentation

## 2015-02-27 DIAGNOSIS — Y92513 Shop (commercial) as the place of occurrence of the external cause: Secondary | ICD-10-CM | POA: Diagnosis not present

## 2015-02-27 DIAGNOSIS — Z79899 Other long term (current) drug therapy: Secondary | ICD-10-CM | POA: Insufficient documentation

## 2015-02-27 DIAGNOSIS — I10 Essential (primary) hypertension: Secondary | ICD-10-CM | POA: Insufficient documentation

## 2015-02-27 DIAGNOSIS — E039 Hypothyroidism, unspecified: Secondary | ICD-10-CM | POA: Insufficient documentation

## 2015-02-27 DIAGNOSIS — Y998 Other external cause status: Secondary | ICD-10-CM | POA: Diagnosis not present

## 2015-02-27 DIAGNOSIS — S41001A Unspecified open wound of right shoulder, initial encounter: Secondary | ICD-10-CM | POA: Insufficient documentation

## 2015-02-27 DIAGNOSIS — T07XXXA Unspecified multiple injuries, initial encounter: Secondary | ICD-10-CM

## 2015-02-27 DIAGNOSIS — S0990XA Unspecified injury of head, initial encounter: Secondary | ICD-10-CM | POA: Diagnosis present

## 2015-02-27 DIAGNOSIS — W100XXA Fall (on)(from) escalator, initial encounter: Secondary | ICD-10-CM | POA: Insufficient documentation

## 2015-02-27 MED ORDER — TETANUS-DIPHTH-ACELL PERTUSSIS 5-2.5-18.5 LF-MCG/0.5 IM SUSP
0.5000 mL | Freq: Once | INTRAMUSCULAR | Status: AC
  Administered 2015-02-27: 0.5 mL via INTRAMUSCULAR
  Filled 2015-02-27: qty 0.5

## 2015-02-27 NOTE — ED Provider Notes (Signed)
CSN: [id number]     Arrival date & time 02/27/15  1939 History   First MD Initiated Contact with Patient 02/27/15 1957     Chief Complaint  Patient presents with  . Fall     (Consider location/radiation/quality/duration/timing/severity/associated sxs/prior Treatment) HPI Comments: Patient presents after fall. His wife stumbled at the base of the escalator at and he tried to help her up and fell himself. Struck his head and right side on the stairs of the escalator. Did not lose consciousness. He does not take any blood thinners. Has multiple abrasions and skin tears. Denies any chest pain or shortness of breath.  The history is provided by the patient. The history is limited by the condition of the patient.    Past Medical History  Diagnosis Date  . Anemia     NOS  . Hypertension   . Hypothyroidism   . Colon polyps   . BPH (benign prostatic hyperplasia)   . Diverticulosis    Past Surgical History  Procedure Laterality Date  . Eye surgery    . Hemorrhoid surgery  1972  . 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 Inguinal hernia repair  04/1995  . Colonoscopy      multiple   Family History  Problem Relation Age of Onset  . Lung cancer Father   . Lung cancer Brother   . Other Mother     aorta ruptured   Social History  Substance Use Topics  . Smoking status: Never Smoker   . Smokeless tobacco: Never Used     Comment: "few cigarettes when young"  . Alcohol Use: No     Comment: occasionally,     Review of Systems  Constitutional: Negative for fever, activity change and appetite change.  HENT: Negative for congestion, nosebleeds and rhinorrhea.   Respiratory: Negative for cough, chest tightness and shortness of breath.   Cardiovascular: Negative for chest pain.  Gastrointestinal: Negative for nausea, vomiting and abdominal pain.  Genitourinary: Negative for dysuria, urgency, hematuria and decreased urine volume.  Musculoskeletal: Positive for myalgias and  arthralgias. Negative for back pain.  Skin: Positive for wound.  Neurological: Negative for dizziness, weakness, light-headedness and headaches.  A complete 10 system review of systems was obtained and all systems are negative except as noted in the HPI and PMH.      Allergies  Review of patient's allergies indicates no known allergies.  Home Medications   Prior to Admission medications   Medication Sig Start Date End Date Taking? Authorizing Provider  celecoxib (CELEBREX) 200 MG capsule Take 1 capsule (200 mg total) by mouth daily. 02/08/15  Yes Etta Grandchild, MD  Cyanocobalamin (B-12 IJ) Inject as directed every 30 (thirty) days.   Yes Historical Provider, MD  dorzolamide-timolol (COSOPT) 22.3-6.8 MG/ML ophthalmic solution Place 1 drop into the left eye 2 (two) times daily.    Yes Historical Provider, MD  SYNTHROID 75 MCG tablet TAKE 1 TABLET DAILY 10/11/14  Yes Etta Grandchild, MD  triamterene-hydrochlorothiazide (DYAZIDE) 37.5-25 MG per capsule TAKE 1 CAPSULE DAILY 01/09/15  Yes Etta Grandchild, MD   BP 128/73 mmHg  Pulse 79  Temp(Src) 98 F (36.7 C) (Oral)  Resp 24  Ht 5\' 10"  (1.778 m)  Wt 128 lb (58.06 kg)  BMI 18.37 kg/m2  SpO2 99% Physical Exam  Constitutional: He is oriented to person, place, and time. He appears well-developed and well-nourished. No distress.  Abrasion right scalp  HENT:  Head: Normocephalic and  atraumatic.  Mouth/Throat: Oropharynx is clear and moist. No oropharyngeal exudate.  Eyes: Conjunctivae and EOM are normal. Pupils are equal, round, and reactive to light.  Neck: Normal range of motion. Neck supple.  No C spine or T spine tenderness  Cardiovascular: Normal rate, regular rhythm, normal heart sounds and intact distal pulses.   No murmur heard. Pulmonary/Chest: Effort normal and breath sounds normal. No respiratory distress. He exhibits no tenderness.  Abdominal: Soft. There is no tenderness. There is no rebound and no guarding.  Musculoskeletal:  Normal range of motion. He exhibits tenderness. He exhibits no edema.  Extensive skin tears and abrasions to right shoulder, upper arm, right scapula Intact radial pulse, intact range of motion of right elbow and right wrist. Cardinal hand movements intact. No L spine tenderness FROM hips without pain  Neurological: He is alert and oriented to person, place, and time. No cranial nerve deficit. He exhibits normal muscle tone. Coordination normal.  No ataxia on finger to nose bilaterally. No pronator drift. 5/5 strength throughout. CN 2-12 intact. Equal grip strength. Sensation intact.   Skin: Skin is warm.  Psychiatric: He has a normal mood and affect. His behavior is normal.  Nursing note and vitals reviewed.   ED Course  Procedures (including critical care time) Labs Review Labs Reviewed - No data to display  Imaging Review Dg Chest 2 View  02/28/2015   CLINICAL DATA:  81 year old male with fall and multiple abrasions to the right arm.  EXAM: CHEST  2 VIEW  COMPARISON:  Radiograph dated 09/13/2014  FINDINGS: Two views of the chest demonstrate emphysematous changes of the lungs. There is no focal consolidation, pleural effusion, or pneumothorax. Stable cardiac silhouette. The bones are osteopenic. There are focal irregularities of the right anterior fifth and sixth ribs, age indeterminate, possibly chronic. Clinical correlation is recommended. No definite other acute fracture.  IMPRESSION: Right anterior fifth and sixth rib irregularity, possibly chronic changes. Clinical correlation is recommended. No other acute intrathoracic pathology identified.   Electronically Signed   By: Elgie Collard M.D.   On: 02/28/2015 00:05   Dg Pelvis 1-2 Views  02/27/2015   CLINICAL DATA:  Larey Seat tonight on escalator  EXAM: PELVIS - 1-2 VIEW  COMPARISON:  RIGHT hip radiographs 02/23/2012  FINDINGS: IM nail with compression screw proximal LEFT femur post ORIF.  Displaced lesser trochanteric fragment identified.   Question age-indeterminate fracture of RIGHT inferior pubic ramus.  No additional fracture, dislocation or bone destruction.  Numerous pelvic phleboliths.  Scattered atherosclerotic calcifications.  IMPRESSION: Osseous demineralization with postsurgical changes of proximal LEFT femoral ORIF.  Questionable age-indeterminate fracture of the inferior RIGHT pubic ramus.   Electronically Signed   By: Ulyses Southward M.D.   On: 02/27/2015 23:59   Dg Shoulder Right  02/27/2015   CLINICAL DATA:  Larey Seat tonight on escalator, multiple skin tears and abrasions RIGHT arm  EXAM: RIGHT SHOULDER - 2+ VIEW  COMPARISON:  02/11/2012  FINDINGS: Osseous demineralization.  AC joint alignment normal with minimal degenerative changes.  Humeral head approximates undersurface of acromion compatible with chronic rotator cuff tear.  No acute fracture, dislocation, or bone destruction.  Visualized RIGHT ribs intact.  IMPRESSION: Osseous demineralization with chronic rotator cuff tear.  No acute osseous abnormalities.   Electronically Signed   By: Ulyses Southward M.D.   On: 02/27/2015 23:55   Dg Forearm Right  02/27/2015   CLINICAL DATA:  Larey Seat on escalator, multiple abrasions and skin tears RIGHT arm  EXAM: RIGHT FOREARM -  2 VIEW  COMPARISON:  None  FINDINGS: Osseous demineralization.  Wrist and elbow joint alignments normal.  No acute fracture, dislocation or bone destruction.  IMPRESSION: No acute osseous abnormalities.   Electronically Signed   By: Ulyses Southward M.D.   On: 02/27/2015 23:57   Ct Head Wo Contrast  02/27/2015   CLINICAL DATA:  Larey Seat down escalator, RIGHT frontal laceration, hypertension  EXAM: CT HEAD WITHOUT CONTRAST  CT CERVICAL SPINE WITHOUT CONTRAST  TECHNIQUE: Multidetector CT imaging of the head and cervical spine was performed following the standard protocol without intravenous contrast. Multiplanar CT image reconstructions of the cervical spine were also generated.  COMPARISON:  CT head 03/21/2014  FINDINGS: CT HEAD  FINDINGS  Generalized atrophy.  Normal ventricular morphology.  No midline shift or mass effect.  Small vessel chronic ischemic changes of deep cerebral white matter.  No intracranial hemorrhage, new mass lesion, or evidence acute infarction.  Question tiny old lacunar infarct at RIGHT thalamus.  No extra-axial fluid collections.  Atherosclerotic calcifications at carotid siphons.  Bones and sinuses unremarkable.  CT CERVICAL SPINE FINDINGS  Visualized skullbase intact.  Marked osseous demineralization.  Prevertebral soft tissues normal thickness.  Cervical body heights maintained.  Anterior height loss of T4 vertebral body, age indeterminate, no definite acute fracture plane visualized.  Disc space narrowing with endplate spur formation C6-C7, less C5-C6 and C7-T1.  Scattered vacuum phenomenon at C4-C5, C6-C7 and C7-T1.  Multilevel facet degenerative changes with areas of ankylosis particularly C3-C4.  Cervicothoracic scoliosis minimal RIGHT apical lung scarring.  IMPRESSION: Atrophy with small vessel chronic ischemic changes of deep cerebral white matter.  Question tiny old lacunar infarct RIGHT thalamus.  No acute intracranial abnormalities.  Advanced cervical degenerative disc and facet disease changes with cervicothoracic scoliosis.  Age-indeterminate superior endplate compression fracture of T4 vertebral body with mild anterior height loss.  No definite acute cervical spine abnormalities.   Electronically Signed   By: Ulyses Southward M.D.   On: 02/27/2015 22:19   Ct Cervical Spine Wo Contrast  02/27/2015   CLINICAL DATA:  Larey Seat down escalator, RIGHT frontal laceration, hypertension  EXAM: CT HEAD WITHOUT CONTRAST  CT CERVICAL SPINE WITHOUT CONTRAST  TECHNIQUE: Multidetector CT imaging of the head and cervical spine was performed following the standard protocol without intravenous contrast. Multiplanar CT image reconstructions of the cervical spine were also generated.  COMPARISON:  CT head 03/21/2014  FINDINGS:  CT HEAD FINDINGS  Generalized atrophy.  Normal ventricular morphology.  No midline shift or mass effect.  Small vessel chronic ischemic changes of deep cerebral white matter.  No intracranial hemorrhage, new mass lesion, or evidence acute infarction.  Question tiny old lacunar infarct at RIGHT thalamus.  No extra-axial fluid collections.  Atherosclerotic calcifications at carotid siphons.  Bones and sinuses unremarkable.  CT CERVICAL SPINE FINDINGS  Visualized skullbase intact.  Marked osseous demineralization.  Prevertebral soft tissues normal thickness.  Cervical body heights maintained.  Anterior height loss of T4 vertebral body, age indeterminate, no definite acute fracture plane visualized.  Disc space narrowing with endplate spur formation C6-C7, less C5-C6 and C7-T1.  Scattered vacuum phenomenon at C4-C5, C6-C7 and C7-T1.  Multilevel facet degenerative changes with areas of ankylosis particularly C3-C4.  Cervicothoracic scoliosis minimal RIGHT apical lung scarring.  IMPRESSION: Atrophy with small vessel chronic ischemic changes of deep cerebral white matter.  Question tiny old lacunar infarct RIGHT thalamus.  No acute intracranial abnormalities.  Advanced cervical degenerative disc and facet disease changes with cervicothoracic scoliosis.  Age-indeterminate  superior endplate compression fracture of T4 vertebral body with mild anterior height loss.  No definite acute cervical spine abnormalities.   Electronically Signed   By: Ulyses Southward M.D.   On: 02/27/2015 22:19   Dg Humerus Right  02/27/2015   CLINICAL DATA:  Larey Seat on escalator, multiple abrasions and skin tears RIGHT arm  EXAM: RIGHT HUMERUS - 2+ VIEW  COMPARISON:  None  FINDINGS: Osseous demineralization.  Humeral head approximates undersurface of acromion compatible with chronic rotator cuff tear.  No acute fracture, dislocation or bone destruction.  IMPRESSION: No acute osseous abnormalities.   Electronically Signed   By: Ulyses Southward M.D.   On:  02/27/2015 23:56   I have personally reviewed and evaluated these images and lab results as part of my medical decision-making.   EKG Interpretation None      MDM   Final diagnoses:  Fall, initial encounter  Multiple contusions   Fall on escalator with multiple abrasions and skin tears. No blood thinner use. Neurologically intact. Update tetanus.  Imaging remarkable for no acute intracranial injury. Age-indeterminate T4 superior endplate compression fracture. Patient is nontender in this area.  X-rays show possible right inferior pubic ramus fracture of age indeterminate. Patient is nontender in this area is able to ambulate.  Home health care RN arranged for wound care. Patient able to ambulate and states he has no pain.  HHRN for wound changes.  Wife is also elderly and has similar injuries and patient states they have no one to help them at home.  BP 137/89 mmHg  Pulse 80  Temp(Src) 98.2 F (36.8 C) (Oral)  Resp 16  Ht  (1.778 m)  Wt 128 lb (58.06 kg)  BMI 18.37 kg/m2  SpO2 99%    Glynn Octave, MD 02/28/15 [phone]

## 2015-02-27 NOTE — ED Notes (Signed)
Patient and wife were shopping at [facility], going up escalator when wife began to lose balance and patient tried to break her fall, but both ended up falling down few steps.  Pt has quarter-sized lac to R side forehead and large skin tear going down R arm, other small skin tears to forearms and L shin.  Bleeding controlled.  Pt A&O x 4, denies LOC, not on blood thinners, denies pain, N/V, dizziness.  Hx HTN.

## 2015-02-28 ENCOUNTER — Telehealth: Payer: Self-pay | Admitting: *Deleted

## 2015-02-28 NOTE — ED Notes (Signed)
Discussed discharge instructions with patient.  Patient verbalized understanding.  Patient to go home via [facility], but requests to wait with wife (in another room in ED) so they may both leave together.  Taking patient via wheelchair to wife's room.

## 2015-02-28 NOTE — ED Notes (Signed)
Ambulated pt down hall.  Patient ambulated well, denies dizziness/pain.

## 2015-02-28 NOTE — Telephone Encounter (Signed)
CM spoke with patient concerning discharge planning. Pt offered choice for [facility] for HH services upon discharge. Per pt choice [facility] to provide HH services.  [facility] rep Stephanie contacted concerning new referral. Pt discharged home last night.  No DME needs stated.    

## 2015-02-28 NOTE — Discharge Instructions (Signed)
Contusion Keep your wounds clean and dry. The home nurse will visit you. Followup with your doctor. Return to the ED if you develop new or worsening symptoms. A contusion is a deep bruise. Contusions are the result of an injury that caused bleeding under the skin. The contusion may turn blue, purple, or yellow. Minor injuries will give you a painless contusion, but more severe contusions may stay painful and swollen for a few weeks.  CAUSES  A contusion is usually caused by a blow, trauma, or direct force to an area of the body. SYMPTOMS   Swelling and redness of the injured area.  Bruising of the injured area.  Tenderness and soreness of the injured area.  Pain. DIAGNOSIS  The diagnosis can be made by taking a history and physical exam. An X-ray, CT scan, or MRI may be needed to determine if there were any associated injuries, such as fractures. TREATMENT  Specific treatment will depend on what area of the body was injured. In general, the best treatment for a contusion is resting, icing, elevating, and applying cold compresses to the injured area. Over-the-counter medicines may also be recommended for pain control. Ask your caregiver what the best treatment is for your contusion. HOME CARE INSTRUCTIONS   Put ice on the injured area.  Put ice in a plastic bag.  Place a towel between your skin and the bag.  Leave the ice on for 15-20 minutes, 3-4 times a day, or as directed by your health care provider.  Only take over-the-counter or prescription medicines for pain, discomfort, or fever as directed by your caregiver. Your caregiver may recommend avoiding anti-inflammatory medicines (aspirin, ibuprofen, and naproxen) for 48 hours because these medicines may increase bruising.  Rest the injured area.  If possible, elevate the injured area to reduce swelling. SEEK IMMEDIATE MEDICAL CARE IF:   You have increased bruising or swelling.  You have pain that is getting worse.  Your  swelling or pain is not relieved with medicines. MAKE SURE YOU:   Understand these instructions.  Will watch your condition.  Will get help right away if you are not doing well or get worse. Document Released: 03/05/2005 Document Revised: 05/31/2013 Document Reviewed: 03/31/2011 [facility] Patient Information 2015 [place], [place]. This information is not intended to replace advice given to you by your health care provider. Make sure you discuss any questions you have with your health care provider.

## 2015-02-28 NOTE — ED Notes (Signed)
[facility] CALLED @ 0135.

## 2015-03-06 ENCOUNTER — Other Ambulatory Visit: Payer: Self-pay | Admitting: Internal Medicine

## 2015-03-14 ENCOUNTER — Encounter: Payer: Self-pay | Admitting: Internal Medicine

## 2015-03-14 ENCOUNTER — Ambulatory Visit (INDEPENDENT_AMBULATORY_CARE_PROVIDER_SITE_OTHER): Payer: Medicare Other | Admitting: Internal Medicine

## 2015-03-14 VITALS — BP 116/84 | HR 61 | Temp 97.6°F | Resp 16

## 2015-03-14 DIAGNOSIS — R634 Abnormal weight loss: Secondary | ICD-10-CM

## 2015-03-14 DIAGNOSIS — T148 Other injury of unspecified body region: Secondary | ICD-10-CM

## 2015-03-14 DIAGNOSIS — T07XXXA Unspecified multiple injuries, initial encounter: Secondary | ICD-10-CM

## 2015-03-14 NOTE — Progress Notes (Signed)
Pre visit review using our clinic review tool, if applicable. No additional management support is needed unless otherwise documented below in the visit note. 

## 2015-03-14 NOTE — Patient Instructions (Signed)
Please discuss home health needs such as durable equipment with the Home Health Nurse to help decrease risk of recurrent falling.

## 2015-03-14 NOTE — Progress Notes (Signed)
   Subjective:    Patient ID: Connor Miller, male    DOB: 1922-06-14, 81 y.o.   MRN: [id number]  HPI   His wife sustained facial and head injuries as well as extremity lacerations in a fall on the escalator 02/27/15.  The facial and skull lacerations were sutured in the emergency room.   He fell injuring himself as well trying to "rescue her". He sustained abrasions to the forehead ,right upper extremity and right upper back as well as the right lower extremity along its lateral surface. He did not seek help but has been seen by the home health nurse who has attended her.  Apparently she's been changing his dressings.  He denies any cardiac or neurologic prodrome prior to the fall.   He denies any  loss of consciousness , visual changes or headache since the fall.    He denies balance issues although he admits to "falling off a ladder in the past".   He & has wife live alone and have no adult children. Apparently she has a sister who lives in [place].   Review of Systems    His wife is concerned that he has lost weight ; the last weight on record was 128 pounds on 02/27/15. Maximum weight dating back to 07/01/13 was 137 lbs. 4 oz.  Denied were any change in heart rhythm or rate prior to the event. There was no associated chest pain or shortness of breath .  Also specifically denied prior to the episode were headache, limb weakness, tingling, or numbness. No seizure activity noted.     Objective:   Physical Exam Pertinent or positive findings include:  His head and neck are deviated to the left suggestion a torticollis type variant. He appears suboptimally nourished. There is wasting of the limbs. There is splitting of the first heart sound. He has 1+ pedal edema. Pedal pulses are decreased. He has extensive abrasions over the right upper extremity. Older healed abrasions are noted distally over the forearms. Abrasions are noted over the upper back and a small abrasion over the right  forehead. He has several abrasions of the right lateral leg. None of the lesions reveal pus formation or frank cellulitis. The dressings were stained  and foul smelling however. He is oriented 3.  General appearance :in no distress.  Eyes: No conjunctival inflammation or scleral icterus is present.  Heart:  Normal rate and regular rhythm. S 2 normal without gallop, murmur, click, rub or other extra sounds    Lungs:Chest clear to auscultation; no wheezes, rhonchi,rales ,or rubs present.No increased work of breathing.   Abdomen: bowel sounds normal, soft and non-tender without masses, organomegaly or hernias noted.  No guarding or rebound.   Vascular : all pulses equal ; no bruits present.  Skin:Warm & dry ; no jaundice   Lymphatic: No lymphadenopathy is noted about the head, neck, axilla.   Neuro: Strength, tone severely decreased. Gait is slightly broad and unsteady.      Assessment & Plan:   #1 multiple abrasions sustained in a mechanical fall.   #2 clinically failure to thrive is suggested. Approximately 9 pound weight loss has been documented in the last 21 months. I discussed considering retirement facility for enhanced support system; this was declined.  Plan: The foul-smelling dressings were removed and new dressings applied after cleansing of the abrasions over the right upper extremity.   A copy of this note will be sent to [facility].

## 2015-03-15 ENCOUNTER — Telehealth: Payer: Self-pay | Admitting: Emergency Medicine

## 2015-03-15 NOTE — Telephone Encounter (Signed)
Spoke with Almira Coaster, Clinical manager from [facility] to inform of pts condition and status of pts bandage when he came in yesterday with his wife. She stated the [facility] is coming out almost every other day for home care, which more than normal for most pts. She stated that pt refused care today when home health nurse came to home. She will be looking into current care of pt and wife.

## 2015-03-20 ENCOUNTER — Ambulatory Visit (INDEPENDENT_AMBULATORY_CARE_PROVIDER_SITE_OTHER): Payer: Medicare Other | Admitting: Internal Medicine

## 2015-03-20 ENCOUNTER — Encounter: Payer: Self-pay | Admitting: Internal Medicine

## 2015-03-20 VITALS — BP 118/68 | HR 69 | Temp 98.1°F | Resp 16 | Ht 70.0 in | Wt 131.0 lb

## 2015-03-20 DIAGNOSIS — L089 Local infection of the skin and subcutaneous tissue, unspecified: Secondary | ICD-10-CM

## 2015-03-20 DIAGNOSIS — T148XXA Other injury of unspecified body region, initial encounter: Principal | ICD-10-CM

## 2015-03-20 DIAGNOSIS — T148 Other injury of unspecified body region: Secondary | ICD-10-CM | POA: Diagnosis not present

## 2015-03-20 HISTORY — DX: Local infection of the skin and subcutaneous tissue, unspecified: L08.9

## 2015-03-20 NOTE — Patient Instructions (Signed)
Wound Care °Taking care of your wound properly can help to prevent pain and infection. It can also help your wound to heal more quickly.  °HOW TO CARE FOR YOUR WOUND  °· Take or apply over-the-counter and prescription medicines only as told by your health care provider. °· If you were prescribed antibiotic medicine, take or apply it as told by your health care provider. Do not stop using the antibiotic even if your condition improves. °· Clean the wound each day or as told by your health care provider. °¨ Wash the wound with mild soap and water. °¨ Rinse the wound with water to remove all soap. °¨ Pat the wound dry with a clean towel. Do not rub it. °· There are many different ways to close and cover a wound. For example, a wound can be covered with stitches (sutures), skin glue, or adhesive strips. Follow instructions from your health care provider about: °¨ How to take care of your wound. °¨ When and how you should change your bandage (dressing). °¨ When you should remove your dressing. °¨ Removing whatever was used to close your wound. °· Check your wound every day for signs of infection. Watch for: °¨ Redness, swelling, or pain. °¨ Fluid, blood, or pus. °· Keep the dressing dry until your health care provider says it can be removed. Do not take baths, swim, use a hot tub, or do anything that would put your wound underwater until your health care provider approves. °· Raise (elevate) the injured area above the level of your heart while you are sitting or lying down. °· Do not scratch or pick at the wound. °· Keep all follow-up visits as told by your health care provider. This is important. °SEEK MEDICAL CARE IF: °· You received a tetanus shot and you have swelling, severe pain, redness, or bleeding at the injection site. °· You have a fever. °· Your pain is not controlled with medicine. °· You have increased redness, swelling, or pain at the site of your wound. °· You have fluid, blood, or pus coming from your  wound. °· You notice a bad smell coming from your wound or your dressing. °SEEK IMMEDIATE MEDICAL CARE IF: °· You have a red streak going away from your wound. °  °This information is not intended to replace advice given to you by your health care provider. Make sure you discuss any questions you have with your health care provider. °  °Document Released: 03/04/2008 Document Revised: 10/10/2014 Document Reviewed: 05/22/2014 °Elsevier Interactive Patient Education ©2016 [employer] ° °

## 2015-03-20 NOTE — Progress Notes (Signed)
Subjective:  Patient ID: Connor Miller, male    DOB: Jun 29, 1922  Age: 81 y.o. MRN: [id number]  CC: Wound Check   HPI Connor Miller presents for recheck of wound over right upper arm, this occurred over 2 weeks ago due to a fall - his only complaint is the wound area itches.  Outpatient Prescriptions Prior to Visit  Medication Sig Dispense Refill  . celecoxib (CELEBREX) 200 MG capsule Take 1 capsule (200 mg total) by mouth daily. 90 capsule 2  . Cyanocobalamin (B-12 IJ) Inject as directed every 30 (thirty) days.    . dorzolamide-timolol (COSOPT) 22.3-6.8 MG/ML ophthalmic solution Place 1 drop into the left eye 2 (two) times daily.     Connor Miller Kitchen SYNTHROID 75 MCG tablet TAKE 1 TABLET DAILY 90 tablet 1  . triamterene-hydrochlorothiazide (DYAZIDE) 37.5-25 MG per capsule TAKE 1 CAPSULE DAILY 90 capsule 1   No facility-administered medications prior to visit.    ROS Review of Systems  Constitutional: Negative.   HENT: Negative.   Eyes: Negative.  Negative for visual disturbance.  Respiratory: Negative.  Negative for cough, shortness of breath and stridor.   Cardiovascular: Negative.  Negative for chest pain and leg swelling.  Gastrointestinal: Negative.  Negative for abdominal pain.  Endocrine: Negative.   Genitourinary: Negative.   Musculoskeletal: Negative.  Negative for myalgias, back pain, joint swelling, arthralgias and neck pain.  Skin: Positive for wound. Negative for color change, pallor and rash.  Allergic/Immunologic: Negative.   Neurological: Negative.  Negative for dizziness, weakness, light-headedness, numbness and headaches.  Hematological: Negative.  Negative for adenopathy. Does not bruise/bleed easily.  Psychiatric/Behavioral: Negative.     Objective:  BP 118/68 mmHg  Pulse 69  Temp(Src) 98.1 F (36.7 C) (Oral)  Resp 16  Ht  (1.778 m)  Wt 131 lb (59.421 kg)  BMI 18.80 kg/m2  SpO2 95%  BP Readings from Last 3 Encounters:  03/20/15 118/68  03/14/15 116/84    02/28/15 137/89    Wt Readings from Last 3 Encounters:  03/20/15 131 lb (59.421 kg)  02/27/15 128 lb (58.06 kg)  02/08/15 127 lb (57.607 kg)    Physical Exam  Constitutional: He is oriented to person, place, and time. No distress.  HENT:  Mouth/Throat: Oropharynx is clear and moist. No oropharyngeal exudate.  Eyes: Conjunctivae are normal. Right eye exhibits no discharge. Left eye exhibits no discharge. No scleral icterus.  Neck: Normal range of motion. Neck supple. No JVD present. No tracheal deviation present. No thyromegaly present.  Cardiovascular: Normal rate, regular rhythm, normal heart sounds and intact distal pulses.  Exam reveals no gallop and no friction rub.   No murmur heard. Pulmonary/Chest: Effort normal and breath sounds normal. No stridor. No respiratory distress. He has no wheezes. He has no rales. He exhibits no tenderness.  Abdominal: Soft. Bowel sounds are normal. He exhibits no distension and no mass. There is no tenderness. There is no rebound and no guarding.  Musculoskeletal: Normal range of motion. He exhibits no edema or tenderness.       Right upper arm: He exhibits deformity. He exhibits no tenderness, no bony tenderness, no swelling, no edema and no laceration.       Arms: Lymphadenopathy:    He has no cervical adenopathy.  Neurological: He is alert and oriented to person, place, and time. He has normal reflexes. Coordination normal.  Skin: Skin is warm and dry. No rash noted. He is not diaphoretic. No erythema. No pallor.  Vitals reviewed.  Lab Results  Component Value Date   WBC 4.1 03/21/2014   HGB 12.2* 03/21/2014   HCT 36.0* 03/21/2014   PLT 185 03/21/2014   GLUCOSE 87 11/08/2014   CHOL 148 11/08/2014   TRIG 73.0 11/08/2014   HDL 54.10 11/08/2014   LDLCALC 79 11/08/2014   ALT 12 03/21/2014   AST 18 03/21/2014   NA 137 11/08/2014   K 3.6 11/08/2014   CL 101 11/08/2014   CREATININE 1.31 11/08/2014   BUN 27* 11/08/2014   CO2 32  11/08/2014   TSH 0.86 11/08/2014   PSA 2.32 05/07/2011   INR 1.11 03/21/2014   HGBA1C 5.5 05/07/2011    Dg Chest 2 View  02/28/2015   CLINICAL DATA:  81 year old male with fall and multiple abrasions to the right arm.  EXAM: CHEST  2 VIEW  COMPARISON:  Radiograph dated 09/13/2014  FINDINGS: Two views of the chest demonstrate emphysematous changes of the lungs. There is no focal consolidation, pleural effusion, or pneumothorax. Stable cardiac silhouette. The bones are osteopenic. There are focal irregularities of the right anterior fifth and sixth ribs, age indeterminate, possibly chronic. Clinical correlation is recommended. No definite other acute fracture.  IMPRESSION: Right anterior fifth and sixth rib irregularity, possibly chronic changes. Clinical correlation is recommended. No other acute intrathoracic pathology identified.   Electronically Signed   By: Elgie Collard M.D.   On: 02/28/2015 00:05   Dg Pelvis 1-2 Views  02/27/2015   CLINICAL DATA:  Larey Seat tonight on escalator  EXAM: PELVIS - 1-2 VIEW  COMPARISON:  RIGHT hip radiographs 02/23/2012  FINDINGS: IM nail with compression screw proximal LEFT femur post ORIF.  Displaced lesser trochanteric fragment identified.  Question age-indeterminate fracture of RIGHT inferior pubic ramus.  No additional fracture, dislocation or bone destruction.  Numerous pelvic phleboliths.  Scattered atherosclerotic calcifications.  IMPRESSION: Osseous demineralization with postsurgical changes of proximal LEFT femoral ORIF.  Questionable age-indeterminate fracture of the inferior RIGHT pubic ramus.   Electronically Signed   By: Ulyses Southward M.D.   On: 02/27/2015 23:59   Dg Shoulder Right  02/27/2015   CLINICAL DATA:  Larey Seat tonight on escalator, multiple skin tears and abrasions RIGHT arm  EXAM: RIGHT SHOULDER - 2+ VIEW  COMPARISON:  02/11/2012  FINDINGS: Osseous demineralization.  AC joint alignment normal with minimal degenerative changes.  Humeral head  approximates undersurface of acromion compatible with chronic rotator cuff tear.  No acute fracture, dislocation, or bone destruction.  Visualized RIGHT ribs intact.  IMPRESSION: Osseous demineralization with chronic rotator cuff tear.  No acute osseous abnormalities.   Electronically Signed   By: Ulyses Southward M.D.   On: 02/27/2015 23:55   Dg Forearm Right  02/27/2015   CLINICAL DATA:  Larey Seat on escalator, multiple abrasions and skin tears RIGHT arm  EXAM: RIGHT FOREARM - 2 VIEW  COMPARISON:  None  FINDINGS: Osseous demineralization.  Wrist and elbow joint alignments normal.  No acute fracture, dislocation or bone destruction.  IMPRESSION: No acute osseous abnormalities.   Electronically Signed   By: Ulyses Southward M.D.   On: 02/27/2015 23:57   Ct Head Wo Contrast  02/27/2015   CLINICAL DATA:  Larey Seat down escalator, RIGHT frontal laceration, hypertension  EXAM: CT HEAD WITHOUT CONTRAST  CT CERVICAL SPINE WITHOUT CONTRAST  TECHNIQUE: Multidetector CT imaging of the head and cervical spine was performed following the standard protocol without intravenous contrast. Multiplanar CT image reconstructions of the cervical spine were also generated.  COMPARISON:  CT head 03/21/2014  FINDINGS: CT HEAD FINDINGS  Generalized atrophy.  Normal ventricular morphology.  No midline shift or mass effect.  Small vessel chronic ischemic changes of deep cerebral white matter.  No intracranial hemorrhage, new mass lesion, or evidence acute infarction.  Question tiny old lacunar infarct at RIGHT thalamus.  No extra-axial fluid collections.  Atherosclerotic calcifications at carotid siphons.  Bones and sinuses unremarkable.  CT CERVICAL SPINE FINDINGS  Visualized skullbase intact.  Marked osseous demineralization.  Prevertebral soft tissues normal thickness.  Cervical body heights maintained.  Anterior height loss of T4 vertebral body, age indeterminate, no definite acute fracture plane visualized.  Disc space narrowing with endplate spur  formation C6-C7, less C5-C6 and C7-T1.  Scattered vacuum phenomenon at C4-C5, C6-C7 and C7-T1.  Multilevel facet degenerative changes with areas of ankylosis particularly C3-C4.  Cervicothoracic scoliosis minimal RIGHT apical lung scarring.  IMPRESSION: Atrophy with small vessel chronic ischemic changes of deep cerebral white matter.  Question tiny old lacunar infarct RIGHT thalamus.  No acute intracranial abnormalities.  Advanced cervical degenerative disc and facet disease changes with cervicothoracic scoliosis.  Age-indeterminate superior endplate compression fracture of T4 vertebral body with mild anterior height loss.  No definite acute cervical spine abnormalities.   Electronically Signed   By: Ulyses Southward M.D.   On: 02/27/2015 22:19   Ct Cervical Spine Wo Contrast  02/27/2015   CLINICAL DATA:  Larey Seat down escalator, RIGHT frontal laceration, hypertension  EXAM: CT HEAD WITHOUT CONTRAST  CT CERVICAL SPINE WITHOUT CONTRAST  TECHNIQUE: Multidetector CT imaging of the head and cervical spine was performed following the standard protocol without intravenous contrast. Multiplanar CT image reconstructions of the cervical spine were also generated.  COMPARISON:  CT head 03/21/2014  FINDINGS: CT HEAD FINDINGS  Generalized atrophy.  Normal ventricular morphology.  No midline shift or mass effect.  Small vessel chronic ischemic changes of deep cerebral white matter.  No intracranial hemorrhage, new mass lesion, or evidence acute infarction.  Question tiny old lacunar infarct at RIGHT thalamus.  No extra-axial fluid collections.  Atherosclerotic calcifications at carotid siphons.  Bones and sinuses unremarkable.  CT CERVICAL SPINE FINDINGS  Visualized skullbase intact.  Marked osseous demineralization.  Prevertebral soft tissues normal thickness.  Cervical body heights maintained.  Anterior height loss of T4 vertebral body, age indeterminate, no definite acute fracture plane visualized.  Disc space narrowing with  endplate spur formation C6-C7, less C5-C6 and C7-T1.  Scattered vacuum phenomenon at C4-C5, C6-C7 and C7-T1.  Multilevel facet degenerative changes with areas of ankylosis particularly C3-C4.  Cervicothoracic scoliosis minimal RIGHT apical lung scarring.  IMPRESSION: Atrophy with small vessel chronic ischemic changes of deep cerebral white matter.  Question tiny old lacunar infarct RIGHT thalamus.  No acute intracranial abnormalities.  Advanced cervical degenerative disc and facet disease changes with cervicothoracic scoliosis.  Age-indeterminate superior endplate compression fracture of T4 vertebral body with mild anterior height loss.  No definite acute cervical spine abnormalities.   Electronically Signed   By: Ulyses Southward M.D.   On: 02/27/2015 22:19   Dg Humerus Right  02/27/2015   CLINICAL DATA:  Larey Seat on escalator, multiple abrasions and skin tears RIGHT arm  EXAM: RIGHT HUMERUS - 2+ VIEW  COMPARISON:  None  FINDINGS: Osseous demineralization.  Humeral head approximates undersurface of acromion compatible with chronic rotator cuff tear.  No acute fracture, dislocation or bone destruction.  IMPRESSION: No acute osseous abnormalities.   Electronically Signed   By: Ulyses Southward M.D.   On: 02/27/2015 23:56  Assessment & Plan:   Connor Miller was seen today for wound check.  Diagnoses and all orders for this visit:  Post-traumatic wound infection (HCC)- the wounds are healing nicely with no evidence of infection. There is some areas of scab and some areas of granulation tissue. I have asked that he not cover these areas anymore as I think exposure to air and oxygen will help them heal more quickly. He will let me know if he develops any new or worsening symptoms.  I am having Connor Miller maintain his dorzolamide-timolol, Cyanocobalamin (B-12 IJ), triamterene-hydrochlorothiazide, celecoxib, and SYNTHROID.  No orders of the defined types were placed in this encounter.     Follow-up: Return if symptoms  worsen or fail to improve.  Sanda Linger, MD

## 2015-03-20 NOTE — Progress Notes (Signed)
Pre visit review using our clinic review tool, if applicable. No additional management support is needed unless otherwise documented below in the visit note. 

## 2015-03-22 ENCOUNTER — Ambulatory Visit: Payer: Medicare Other | Admitting: Internal Medicine

## 2015-05-10 ENCOUNTER — Ambulatory Visit: Payer: Medicare Other | Admitting: Internal Medicine

## 2015-05-10 DIAGNOSIS — Z0289 Encounter for other administrative examinations: Secondary | ICD-10-CM

## 2015-05-23 ENCOUNTER — Encounter: Payer: Self-pay | Admitting: Podiatry

## 2015-05-23 ENCOUNTER — Ambulatory Visit (INDEPENDENT_AMBULATORY_CARE_PROVIDER_SITE_OTHER): Payer: Medicare Other | Admitting: Podiatry

## 2015-05-23 DIAGNOSIS — M79676 Pain in unspecified toe(s): Secondary | ICD-10-CM | POA: Diagnosis not present

## 2015-05-23 DIAGNOSIS — B351 Tinea unguium: Secondary | ICD-10-CM | POA: Diagnosis not present

## 2015-05-24 NOTE — Progress Notes (Signed)
Patient ID: Connor Miller, male   DOB: April 02, 1923, 81 y.o.   MRN: [id number]  Subjective: This patient presents again complaining of thick and elongated toenails which are all comfortable walking wearing shoes and request nail debridement.  Objective: Orientated 3 Open skin lesions bilaterally The toenails are hypertrophic, elongated, discolored and tender to direct palpation 6-10  Assessment: Symptomatic onychomycoses 6-10  Plan: Debrided toenails 10 mechanically and left without any bleeding  Reappoint 4 months

## 2015-07-04 ENCOUNTER — Other Ambulatory Visit: Payer: Self-pay | Admitting: Internal Medicine

## 2015-07-05 ENCOUNTER — Encounter: Payer: Self-pay | Admitting: Gastroenterology

## 2015-09-05 ENCOUNTER — Other Ambulatory Visit: Payer: Self-pay | Admitting: Internal Medicine

## 2015-09-19 ENCOUNTER — Ambulatory Visit: Payer: Medicare Other | Admitting: Podiatry

## 2015-11-19 ENCOUNTER — Ambulatory Visit: Payer: Medicare Other | Admitting: Internal Medicine

## 2015-11-19 DIAGNOSIS — Z0289 Encounter for other administrative examinations: Secondary | ICD-10-CM

## 2015-11-29 ENCOUNTER — Other Ambulatory Visit: Payer: Self-pay | Admitting: Internal Medicine

## 2015-12-12 ENCOUNTER — Telehealth: Payer: Self-pay

## 2015-12-12 NOTE — Telephone Encounter (Signed)
Patient is on the list for [employer] 2017 and may be a good candidate for an AWV in 2017. Please let me know if/when appt is scheduled.   

## 2016-01-08 ENCOUNTER — Ambulatory Visit: Payer: Medicare Other | Admitting: Internal Medicine

## 2016-01-09 ENCOUNTER — Telehealth: Payer: Self-pay | Admitting: Internal Medicine

## 2016-01-09 NOTE — Telephone Encounter (Signed)
Call him to reschedule 

## 2016-01-09 NOTE — Telephone Encounter (Signed)
Patient no showed for follow up on 8/1.  Please advise.

## 2016-01-10 NOTE — Telephone Encounter (Signed)
Got patient scheduled

## 2016-01-16 ENCOUNTER — Telehealth: Payer: Self-pay | Admitting: Internal Medicine

## 2016-01-16 ENCOUNTER — Ambulatory Visit: Payer: Medicare Other | Admitting: Internal Medicine

## 2016-01-16 NOTE — Telephone Encounter (Signed)
Patient no showed for fu 8/9.  Please advise.

## 2016-03-04 ENCOUNTER — Telehealth: Payer: Self-pay | Admitting: Internal Medicine

## 2016-03-04 ENCOUNTER — Encounter: Payer: Medicare Other | Admitting: Internal Medicine

## 2016-03-04 NOTE — Telephone Encounter (Signed)
Pt no showed for cpe on 9/26.  Please advise.

## 2016-03-05 NOTE — Telephone Encounter (Signed)
Patient will call back to reschedule.

## 2016-03-05 NOTE — Telephone Encounter (Signed)
Call him 

## 2016-04-02 ENCOUNTER — Emergency Department (HOSPITAL_COMMUNITY): Payer: Medicare Other

## 2016-04-02 ENCOUNTER — Encounter (HOSPITAL_COMMUNITY): Payer: Self-pay | Admitting: Emergency Medicine

## 2016-04-02 ENCOUNTER — Inpatient Hospital Stay (HOSPITAL_COMMUNITY): Payer: Medicare Other

## 2016-04-02 ENCOUNTER — Inpatient Hospital Stay (HOSPITAL_COMMUNITY)
Admission: EM | Admit: 2016-04-02 | Discharge: 2016-04-11 | DRG: 177 | Disposition: A | Discharge status: Skilled Nursing Facility | Payer: Medicare Other | Attending: Internal Medicine | Admitting: Internal Medicine

## 2016-04-02 DIAGNOSIS — D518 Other vitamin B12 deficiency anemias: Secondary | ICD-10-CM | POA: Diagnosis present

## 2016-04-02 DIAGNOSIS — M6282 Rhabdomyolysis: Secondary | ICD-10-CM | POA: Insufficient documentation

## 2016-04-02 DIAGNOSIS — D649 Anemia, unspecified: Secondary | ICD-10-CM | POA: Insufficient documentation

## 2016-04-02 DIAGNOSIS — Z79899 Other long term (current) drug therapy: Secondary | ICD-10-CM | POA: Diagnosis not present

## 2016-04-02 DIAGNOSIS — R627 Adult failure to thrive: Secondary | ICD-10-CM | POA: Diagnosis present

## 2016-04-02 DIAGNOSIS — E039 Hypothyroidism, unspecified: Secondary | ICD-10-CM | POA: Diagnosis present

## 2016-04-02 DIAGNOSIS — I130 Hypertensive heart and chronic kidney disease with heart failure and stage 1 through stage 4 chronic kidney disease, or unspecified chronic kidney disease: Secondary | ICD-10-CM | POA: Diagnosis present

## 2016-04-02 DIAGNOSIS — R1312 Dysphagia, oropharyngeal phase: Secondary | ICD-10-CM

## 2016-04-02 DIAGNOSIS — Z681 Body mass index (BMI) 19 or less, adult: Secondary | ICD-10-CM | POA: Diagnosis not present

## 2016-04-02 DIAGNOSIS — J189 Pneumonia, unspecified organism: Secondary | ICD-10-CM | POA: Diagnosis present

## 2016-04-02 DIAGNOSIS — Z801 Family history of malignant neoplasm of trachea, bronchus and lung: Secondary | ICD-10-CM

## 2016-04-02 DIAGNOSIS — D519 Vitamin B12 deficiency anemia, unspecified: Secondary | ICD-10-CM | POA: Diagnosis present

## 2016-04-02 DIAGNOSIS — R601 Generalized edema: Secondary | ICD-10-CM | POA: Diagnosis present

## 2016-04-02 DIAGNOSIS — L89112 Pressure ulcer of right upper back, stage 2: Secondary | ICD-10-CM | POA: Diagnosis present

## 2016-04-02 DIAGNOSIS — I248 Other forms of acute ischemic heart disease: Secondary | ICD-10-CM | POA: Diagnosis present

## 2016-04-02 DIAGNOSIS — N183 Chronic kidney disease, stage 3 unspecified: Secondary | ICD-10-CM | POA: Diagnosis present

## 2016-04-02 DIAGNOSIS — W1830XA Fall on same level, unspecified, initial encounter: Secondary | ICD-10-CM | POA: Diagnosis present

## 2016-04-02 DIAGNOSIS — Y92009 Unspecified place in unspecified non-institutional (private) residence as the place of occurrence of the external cause: Secondary | ICD-10-CM

## 2016-04-02 DIAGNOSIS — F0390 Unspecified dementia without behavioral disturbance: Secondary | ICD-10-CM | POA: Diagnosis not present

## 2016-04-02 DIAGNOSIS — W19XXXA Unspecified fall, initial encounter: Secondary | ICD-10-CM

## 2016-04-02 DIAGNOSIS — E538 Deficiency of other specified B group vitamins: Secondary | ICD-10-CM | POA: Diagnosis present

## 2016-04-02 DIAGNOSIS — R0902 Hypoxemia: Secondary | ICD-10-CM

## 2016-04-02 DIAGNOSIS — F0280 Dementia in other diseases classified elsewhere without behavioral disturbance: Secondary | ICD-10-CM | POA: Diagnosis not present

## 2016-04-02 DIAGNOSIS — J690 Pneumonitis due to inhalation of food and vomit: Principal | ICD-10-CM | POA: Insufficient documentation

## 2016-04-02 DIAGNOSIS — D539 Nutritional anemia, unspecified: Secondary | ICD-10-CM | POA: Diagnosis present

## 2016-04-02 DIAGNOSIS — N400 Enlarged prostate without lower urinary tract symptoms: Secondary | ICD-10-CM | POA: Diagnosis present

## 2016-04-02 DIAGNOSIS — R7989 Other specified abnormal findings of blood chemistry: Secondary | ICD-10-CM | POA: Diagnosis not present

## 2016-04-02 DIAGNOSIS — D696 Thrombocytopenia, unspecified: Secondary | ICD-10-CM | POA: Diagnosis present

## 2016-04-02 DIAGNOSIS — Z86010 Personal history of colonic polyps: Secondary | ICD-10-CM

## 2016-04-02 DIAGNOSIS — R0602 Shortness of breath: Secondary | ICD-10-CM

## 2016-04-02 DIAGNOSIS — E860 Dehydration: Secondary | ICD-10-CM | POA: Diagnosis present

## 2016-04-02 DIAGNOSIS — L8995 Pressure ulcer of unspecified site, unstageable: Secondary | ICD-10-CM

## 2016-04-02 DIAGNOSIS — F0151 Vascular dementia with behavioral disturbance: Secondary | ICD-10-CM | POA: Diagnosis not present

## 2016-04-02 DIAGNOSIS — I10 Essential (primary) hypertension: Secondary | ICD-10-CM | POA: Diagnosis not present

## 2016-04-02 DIAGNOSIS — J9601 Acute respiratory failure with hypoxia: Secondary | ICD-10-CM | POA: Diagnosis present

## 2016-04-02 DIAGNOSIS — L8990 Pressure ulcer of unspecified site, unspecified stage: Secondary | ICD-10-CM | POA: Diagnosis present

## 2016-04-02 DIAGNOSIS — L89150 Pressure ulcer of sacral region, unstageable: Secondary | ICD-10-CM

## 2016-04-02 DIAGNOSIS — R945 Abnormal results of liver function studies: Secondary | ICD-10-CM | POA: Diagnosis present

## 2016-04-02 DIAGNOSIS — N179 Acute kidney failure, unspecified: Secondary | ICD-10-CM | POA: Diagnosis present

## 2016-04-02 DIAGNOSIS — R062 Wheezing: Secondary | ICD-10-CM

## 2016-04-02 DIAGNOSIS — J181 Lobar pneumonia, unspecified organism: Secondary | ICD-10-CM | POA: Diagnosis not present

## 2016-04-02 DIAGNOSIS — I5033 Acute on chronic diastolic (congestive) heart failure: Secondary | ICD-10-CM | POA: Diagnosis present

## 2016-04-02 DIAGNOSIS — E038 Other specified hypothyroidism: Secondary | ICD-10-CM

## 2016-04-02 DIAGNOSIS — J9600 Acute respiratory failure, unspecified whether with hypoxia or hypercapnia: Secondary | ICD-10-CM | POA: Diagnosis not present

## 2016-04-02 DIAGNOSIS — F0391 Unspecified dementia with behavioral disturbance: Secondary | ICD-10-CM | POA: Diagnosis not present

## 2016-04-02 DIAGNOSIS — L89154 Pressure ulcer of sacral region, stage 4: Secondary | ICD-10-CM | POA: Diagnosis present

## 2016-04-02 DIAGNOSIS — R740 Nonspecific elevation of levels of transaminase and lactic acid dehydrogenase [LDH]: Secondary | ICD-10-CM | POA: Diagnosis present

## 2016-04-02 HISTORY — DX: Unspecified dementia, unspecified severity, without behavioral disturbance, psychotic disturbance, mood disturbance, and anxiety: F03.90

## 2016-04-02 LAB — CBC WITH DIFFERENTIAL/PLATELET
Basophils Absolute: 0 10*3/uL (ref 0.0–0.1)
Basophils Relative: 0 %
Eosinophils Absolute: 0 10*3/uL (ref 0.0–0.7)
Eosinophils Relative: 0 %
HEMATOCRIT: 36 % — AB (ref 39.0–52.0)
HEMOGLOBIN: 12.3 g/dL — AB (ref 13.0–17.0)
LYMPHS ABS: 0.5 10*3/uL — AB (ref 0.7–4.0)
Lymphocytes Relative: 10 %
MCH: 35.2 pg — AB (ref 26.0–34.0)
MCHC: 34.2 g/dL (ref 30.0–36.0)
MCV: 103.2 fL — AB (ref 78.0–100.0)
MONO ABS: 0.8 10*3/uL (ref 0.1–1.0)
MONOS PCT: 16 %
NEUTROS ABS: 3.5 10*3/uL (ref 1.7–7.7)
NEUTROS PCT: 74 %
Platelets: 113 10*3/uL — ABNORMAL LOW (ref 150–400)
RBC: 3.49 MIL/uL — ABNORMAL LOW (ref 4.22–5.81)
RDW: 14.8 % (ref 11.5–15.5)
WBC: 4.7 10*3/uL (ref 4.0–10.5)

## 2016-04-02 LAB — TROPONIN I: TROPONIN I: 0.1 ng/mL — AB (ref <0.03)

## 2016-04-02 LAB — COMPREHENSIVE METABOLIC PANEL
ALBUMIN: 4.2 g/dL (ref 3.5–5.0)
ALT: 93 U/L — AB (ref 17–63)
ANION GAP: 9 (ref 5–15)
AST: 180 U/L — ABNORMAL HIGH (ref 15–41)
Alkaline Phosphatase: 40 U/L (ref 38–126)
BUN: 57 mg/dL — ABNORMAL HIGH (ref 6–20)
CHLORIDE: 102 mmol/L (ref 101–111)
CO2: 29 mmol/L (ref 22–32)
Calcium: 9.2 mg/dL (ref 8.9–10.3)
Creatinine, Ser: 1.34 mg/dL — ABNORMAL HIGH (ref 0.61–1.24)
GFR calc non Af Amer: 45 mL/min — ABNORMAL LOW (ref >60)
GFR, EST AFRICAN AMERICAN: 52 mL/min — AB (ref >60)
GLUCOSE: 102 mg/dL — AB (ref 65–99)
Potassium: 4.3 mmol/L (ref 3.5–5.1)
SODIUM: 140 mmol/L (ref 135–145)
Total Bilirubin: 1.8 mg/dL — ABNORMAL HIGH (ref 0.3–1.2)
Total Protein: 7.2 g/dL (ref 6.5–8.1)

## 2016-04-02 LAB — STREP PNEUMONIAE URINARY ANTIGEN: Strep Pneumo Urinary Antigen: NEGATIVE

## 2016-04-02 LAB — URINE MICROSCOPIC-ADD ON: WBC UA: NONE SEEN WBC/hpf (ref 0–5)

## 2016-04-02 LAB — URINALYSIS, ROUTINE W REFLEX MICROSCOPIC
Bilirubin Urine: NEGATIVE
Glucose, UA: NEGATIVE mg/dL
Ketones, ur: NEGATIVE mg/dL
LEUKOCYTES UA: NEGATIVE
NITRITE: NEGATIVE
PROTEIN: 30 mg/dL — AB
Specific Gravity, Urine: 1.023 (ref 1.005–1.030)
pH: 6.5 (ref 5.0–8.0)

## 2016-04-02 LAB — CBC AND DIFFERENTIAL
HCT: 36 % — AB (ref 41–53)
HEMOGLOBIN: 12.3 g/dL — AB (ref 13.5–17.5)
PLATELETS: 113 10*3/uL — AB (ref 150–399)
WBC: 4.7 10*3/mL

## 2016-04-02 LAB — BASIC METABOLIC PANEL
BUN: 57 mg/dL — AB (ref 4–21)
CREATININE: 1.3 mg/dL (ref 0.6–1.3)
GLUCOSE: 102 mg/dL
POTASSIUM: 4.3 mmol/L (ref 3.4–5.3)
Sodium: 140 mmol/L (ref 137–147)

## 2016-04-02 LAB — HEPATIC FUNCTION PANEL
ALT: 93 U/L — AB (ref 10–40)
Alkaline Phosphatase: 40 U/L (ref 25–125)
BILIRUBIN, TOTAL: 1.8 mg/dL

## 2016-04-02 LAB — CK: CK TOTAL: 4826 U/L — AB (ref 49–397)

## 2016-04-02 LAB — I-STAT TROPONIN, ED: Troponin i, poc: 0.1 ng/mL (ref 0.00–0.08)

## 2016-04-02 LAB — TSH: TSH: 33.99 u[IU]/mL — ABNORMAL HIGH (ref 0.350–4.500)

## 2016-04-02 LAB — BRAIN NATRIURETIC PEPTIDE: B Natriuretic Peptide: 205.6 pg/mL — ABNORMAL HIGH (ref 0.0–100.0)

## 2016-04-02 LAB — I-STAT CG4 LACTIC ACID, ED: Lactic Acid, Venous: 2.34 mmol/L (ref 0.5–1.9)

## 2016-04-02 LAB — LACTIC ACID, PLASMA: Lactic Acid, Venous: 1.4 mmol/L (ref 0.5–1.9)

## 2016-04-02 LAB — PROCALCITONIN: Procalcitonin: 2.53 ng/mL

## 2016-04-02 MED ORDER — SODIUM CHLORIDE 0.9 % IV SOLN
3.0000 g | Freq: Two times a day (BID) | INTRAVENOUS | Status: DC
  Administered 2016-04-02: 3 g via INTRAVENOUS
  Filled 2016-04-02 (×2): qty 3

## 2016-04-02 MED ORDER — MORPHINE SULFATE (PF) 2 MG/ML IV SOLN
1.0000 mg | INTRAVENOUS | Status: DC | PRN
  Administered 2016-04-06 – 2016-04-10 (×3): 1 mg via INTRAVENOUS
  Filled 2016-04-02 (×3): qty 1

## 2016-04-02 MED ORDER — SODIUM CHLORIDE 0.9 % IV BOLUS (SEPSIS)
1000.0000 mL | Freq: Once | INTRAVENOUS | Status: AC
  Administered 2016-04-02: 1000 mL via INTRAVENOUS

## 2016-04-02 MED ORDER — CEFTRIAXONE SODIUM 1 G IJ SOLR
1.0000 g | Freq: Once | INTRAMUSCULAR | Status: AC
  Administered 2016-04-02: 1 g via INTRAVENOUS
  Filled 2016-04-02: qty 10

## 2016-04-02 MED ORDER — SODIUM CHLORIDE 0.9 % IV BOLUS (SEPSIS): 1000.0000 mL | Freq: Once | INTRAVENOUS | Status: DC

## 2016-04-02 MED ORDER — SODIUM CHLORIDE 0.9 % IV SOLN
INTRAVENOUS | Status: DC
  Administered 2016-04-02: 21:00:00 via INTRAVENOUS

## 2016-04-02 MED ORDER — ERYTHROMYCIN 5 MG/GM OP OINT
TOPICAL_OINTMENT | Freq: Four times a day (QID) | OPHTHALMIC | Status: DC
  Administered 2016-04-02 – 2016-04-03 (×2): via OPHTHALMIC
  Administered 2016-04-03: 1 via OPHTHALMIC
  Administered 2016-04-03 – 2016-04-05 (×8): via OPHTHALMIC
  Administered 2016-04-05: 1 via OPHTHALMIC
  Administered 2016-04-05 – 2016-04-06 (×2): via OPHTHALMIC
  Administered 2016-04-06: 1 via OPHTHALMIC
  Administered 2016-04-06 – 2016-04-09 (×14): via OPHTHALMIC
  Administered 2016-04-10: 1 via OPHTHALMIC
  Administered 2016-04-10: via OPHTHALMIC
  Administered 2016-04-10: 1 via OPHTHALMIC
  Administered 2016-04-10 – 2016-04-11 (×4): via OPHTHALMIC
  Filled 2016-04-02 (×2): qty 3.5

## 2016-04-02 MED ORDER — ACETAMINOPHEN 650 MG RE SUPP: 650.0000 mg | Freq: Four times a day (QID) | RECTAL | Status: DC | PRN

## 2016-04-02 MED ORDER — ACETAMINOPHEN 325 MG PO TABS: 650.0000 mg | ORAL_TABLET | Freq: Four times a day (QID) | ORAL | Status: DC | PRN

## 2016-04-02 MED ORDER — DORZOLAMIDE HCL-TIMOLOL MAL 2-0.5 % OP SOLN
1.0000 [drp] | Freq: Two times a day (BID) | OPHTHALMIC | Status: DC
  Administered 2016-04-02 – 2016-04-11 (×18): 1 [drp] via OPHTHALMIC
  Filled 2016-04-02: qty 10

## 2016-04-02 MED ORDER — DEXTROSE 5 % IV SOLN: 500.0000 mg | Freq: Once | INTRAVENOUS | Status: DC

## 2016-04-02 MED ORDER — LEVOTHYROXINE SODIUM 75 MCG PO TABS
75.0000 ug | ORAL_TABLET | Freq: Every day | ORAL | Status: DC
  Administered 2016-04-03 – 2016-04-08 (×5): 75 ug via ORAL
  Filled 2016-04-02 (×5): qty 1

## 2016-04-02 MED ORDER — ONDANSETRON HCL 4 MG PO TABS: 4.0000 mg | ORAL_TABLET | Freq: Four times a day (QID) | ORAL | Status: DC | PRN

## 2016-04-02 MED ORDER — IPRATROPIUM-ALBUTEROL 0.5-2.5 (3) MG/3ML IN SOLN: 3.0000 mL | RESPIRATORY_TRACT | Status: DC | PRN

## 2016-04-02 MED ORDER — ONDANSETRON HCL 4 MG/2ML IJ SOLN: 4.0000 mg | Freq: Four times a day (QID) | INTRAMUSCULAR | Status: DC | PRN

## 2016-04-02 MED ORDER — HEPARIN SODIUM (PORCINE) 5000 UNIT/ML IJ SOLN
5000.0000 [IU] | Freq: Three times a day (TID) | INTRAMUSCULAR | Status: DC
  Administered 2016-04-02 – 2016-04-11 (×27): 5000 [IU] via SUBCUTANEOUS
  Filled 2016-04-02 (×25): qty 1

## 2016-04-02 NOTE — ED Provider Notes (Signed)
[facility]-EMERGENCY DEPT Provider Note   CSN: [id number] Arrival date & time: 04/02/16  1556     History   Chief Complaint Chief Complaint  Patient presents with  . Fall  . Social Work Consult    HPI Connor Miller is a 82 y.o. male.  82yo M w/ PMH including dementia, HTN, hypothyroidism, BPH who p/w fall. History obtained from EMS. They picked the patient up from his home, where he lives with his wife. His sister-in-law occasionally checks on them but has not been able to for the past 2-3 weeks due to her own health condition. At some point in the past few days, the patient fell. EMS found him laying on the floor covered in feces and urine. Unclear when he fell and patient is not able to recall details of the event. He currently denies any complaints and is not in any pain.  LEVEL 5 CAVEAT DUE TO DEMENTIA   The history is provided by the EMS personnel.  Fall     Past Medical History:  Diagnosis Date  . Anemia    NOS  . BPH (benign prostatic hyperplasia)   . Colon polyps   . Diverticulosis   . Hypertension   . Hypothyroidism     Patient Active Problem List   Diagnosis Date Noted  . Rhabdomyolysis 04/02/2016  . Hypoxia 04/02/2016  . Decubitus ulcer 04/02/2016  . Dementia 04/02/2016  . CKD (chronic kidney disease), stage III 04/02/2016  . Macrocytic anemia with vitamin B12 deficiency 04/02/2016  . Thrombocytopenia (HCC) 04/02/2016  . Acute respiratory failure with hypoxia (HCC) 04/02/2016  . Pneumonia 04/02/2016  . Post-traumatic wound infection 03/20/2015  . Lumbago 02/08/2015  . Hip pain, bilateral 02/08/2015  . Failure to thrive in adult 02/14/2014  . Complete rotator cuff tear of left shoulder 03/31/2013  . Atherosclerosis of native arteries of the extremities with intermittent claudication 01/20/2013  . Dermatitis, atopic 08/05/2012  . OAB (overactive bladder) 03/24/2012  . Constipation 06/18/2011  . CAP (community acquired pneumonia) 05/28/2011  . BPH  (benign prostatic hyperplasia) 05/07/2011  . Hypothyroidism 08/05/2010  . Essential hypertension, benign 08/05/2010    Past Surgical History:  Procedure Laterality Date  . COLONOSCOPY     multiple  . EYE SURGERY    . HEMORRHOID SURGERY  1972  . INGUINAL HERNIA REPAIR  04/1995  . VITRECTOMY  07/1990   for macular hole       Home Medications    Prior to Admission medications   Medication Sig Start Date End Date Taking? Authorizing Provider  celecoxib (CELEBREX) 200 MG capsule Take 1 capsule (200 mg total) by mouth daily. 02/08/15   Etta Grandchildhomas L Jones, MD  Cyanocobalamin (B-12 IJ) Inject as directed every 30 (thirty) days.    Historical Provider, MD  dorzolamide-timolol (COSOPT) 22.3-6.8 MG/ML ophthalmic solution Place 1 drop into the left eye 2 (two) times daily.     Historical Provider, MD  SYNTHROID 75 MCG tablet Take 1 tablet (75 mcg total) by mouth daily. Yearly physical is due must see MD for refills 11/29/15   Etta Grandchildhomas L Jones, MD  triamterene-hydrochlorothiazide (DYAZIDE) 37.5-25 MG capsule TAKE 1 CAPSULE DAILY 09/07/15   Etta Grandchildhomas L Jones, MD    Family History Family History  Problem Relation Age of Onset  . Lung cancer Father   . Lung cancer Brother   . Other Mother     aorta ruptured    Social History Social History  Substance Use Topics  . Smoking status: Never  Smoker  . Smokeless tobacco: Never Used     Comment: "few cigarettes when young"  . Alcohol use No     Comment: occasionally,      Allergies   Review of patient's allergies indicates no known allergies.   Review of Systems Review of Systems  Unable to perform ROS: Dementia     Physical Exam Updated Vital Signs BP (!) 153/101   Pulse 78   Temp 98.4 F (36.9 C)   Resp 15   Ht 5\' 10"  (1.778 m)   Wt 131 lb (59.4 kg)   SpO2 97%   BMI 18.80 kg/m   Physical Exam  Constitutional: No distress.  Cachectic, elderly man, awake  HENT:  Head: Normocephalic.  Nose: Nose normal.  Old L periorbital  ecchymosis w/ dried blood on L side of face, peeling lips, very dry mucous membranes  Eyes: Conjunctivae are normal. Pupils are equal, round, and reactive to light.  Neck:  In c-collar  Cardiovascular: Normal rate, regular rhythm and normal heart sounds.   No murmur heard. Pulmonary/Chest: Breath sounds normal.  Mild tachypnea  Abdominal: Soft. Bowel sounds are normal. He exhibits no distension. There is no tenderness.  Musculoskeletal: He exhibits no edema, tenderness or deformity.  Neurological: He is alert.  Oriented to person only, able to follow basic commands and move all 4 ext  Skin:  unstageable sacral decubitus ulcer; scattered old ecchymoses on extremities  Nursing note and vitals reviewed.    ED Treatments / Results  Labs (all labs ordered are listed, but only abnormal results are displayed) Labs Reviewed  CK - Abnormal; Notable for the following:       Result Value   Total CK 4,826 (*)    All other components within normal limits  COMPREHENSIVE METABOLIC PANEL - Abnormal; Notable for the following:    Glucose, Bld 102 (*)    BUN 57 (*)    Creatinine, Ser 1.34 (*)    AST 180 (*)    ALT 93 (*)    Total Bilirubin 1.8 (*)    GFR calc non Af Amer 45 (*)    GFR calc Af Amer 52 (*)    All other components within normal limits  CBC WITH DIFFERENTIAL/PLATELET - Abnormal; Notable for the following:    RBC 3.49 (*)    Hemoglobin 12.3 (*)    HCT 36.0 (*)    MCV 103.2 (*)    MCH 35.2 (*)    Platelets 113 (*)    Lymphs Abs 0.5 (*)    All other components within normal limits  URINALYSIS, ROUTINE W REFLEX MICROSCOPIC (NOT AT [facility]) - Abnormal; Notable for the following:    Color, Urine AMBER (*)    APPearance CLOUDY (*)    Hgb urine dipstick LARGE (*)    Protein, ur 30 (*)    All other components within normal limits  URINE MICROSCOPIC-ADD ON - Abnormal; Notable for the following:    Squamous Epithelial / LPF 0-5 (*)    Bacteria, UA RARE (*)    Casts HYALINE CASTS  (*)    All other components within normal limits  BRAIN NATRIURETIC PEPTIDE - Abnormal; Notable for the following:    B Natriuretic Peptide 205.6 (*)    All other components within normal limits  I-STAT TROPOININ, ED - Abnormal; Notable for the following:    Troponin i, poc 0.10 (*)    All other components within normal limits  I-STAT CG4 LACTIC ACID, ED - Abnormal;  Notable for the following:    Lactic Acid, Venous 2.34 (*)    All other components within normal limits    EKG  EKG Interpretation None       Radiology Dg Chest 2 View  Result Date: 04/02/2016 CLINICAL DATA:  Unwitnessed fall EXAM: CHEST  2 VIEW COMPARISON:  02/27/2015 chest radiograph. FINDINGS: Left rotated chest radiograph. Stable cardiomediastinal silhouette with borderline mild cardiomegaly and aortic atherosclerosis. No pneumothorax. Trace bilateral pleural effusions. There are hazy bilateral parahilar opacities, most prominent in the right upper lung. No displaced fractures. IMPRESSION: Hazy parahilar lung opacities, most prominent in the right upper lung, potentially representing mild pulmonary edema given the borderline cardiomegaly and trace bilateral pleural effusions, with the differential including infection or aspiration. Recommend follow-up PA and lateral post treatment chest radiographs in 4-6 weeks. Electronically Signed   By: Delbert Phenix M.D.   On: 04/02/2016 17:16   Ct Head Wo Contrast  Result Date: 04/02/2016 CLINICAL DATA:  Unwitnessed fall. EXAM: CT HEAD WITHOUT CONTRAST CT CERVICAL SPINE WITHOUT CONTRAST TECHNIQUE: Multidetector CT imaging of the head and cervical spine was performed following the standard protocol without intravenous contrast. Multiplanar CT image reconstructions of the cervical spine were also generated. COMPARISON:  02/27/2015 head and cervical spine CT. FINDINGS: CT HEAD FINDINGS Brain: No evidence of parenchymal hemorrhage or extra-axial fluid collection. No mass lesion, mass  effect, or midline shift. No CT evidence of acute infarction. Intracranial atherosclerosis. Nonspecific moderate subcortical and periventricular white matter hypodensity, most in keeping with chronic small vessel ischemic change. Generalized cerebral volume loss. Cerebral ventricle sizes are stable and concordant with the degree of cerebral volume loss. Vascular: No hyperdense vessel or unexpected calcification. Skull: No evidence of calvarial fracture. Bilateral scleral banding. Sinuses/Orbits: The visualized paranasal sinuses are essentially clear. Other:  The mastoid air cells are unopacified. CT CERVICAL SPINE FINDINGS Alignment: Minimal 2 mm anterolisthesis at C4-5 is stable. Mild 3 mm anterolisthesis at C7-T1 is stable. No acute subluxation. Skull base and vertebrae: No acute fracture. No primary bone lesion or focal pathologic process. Soft tissues and spinal canal: No prevertebral fluid or swelling. No visible canal hematoma. Disc levels: Severe degenerative disc disease throughout the cervical spine, most prominent at C6-7, noting stable ankylosis at C3-4. Severe bilateral facet arthropathy. Moderate degenerative foraminal stenosis on the right at C2-3. Mild degenerative foraminal stenosis bilaterally at C3-4. Mild degenerative foraminal stenosis on the right at C4-5 and C5-6. Upper chest: Stable pleural-parenchymal scarring at the right greater than left lung apices. Other: Stable calcified 1.0 cm nodule at the superior right side lobe. IMPRESSION: 1. No evidence of acute intracranial abnormality. No evidence of calvarial fracture . 2. Moderate chronic small vessel ischemia and generalized cerebral volume loss. 3. No fracture or acute malalignment in the cervical spine . 4. Severe degenerative changes in the cervical spine as described. Electronically Signed   By: Delbert Phenix M.D.   On: 04/02/2016 17:37   Ct Cervical Spine Wo Contrast  Result Date: 04/02/2016 CLINICAL DATA:  Unwitnessed fall. EXAM:  CT HEAD WITHOUT CONTRAST CT CERVICAL SPINE WITHOUT CONTRAST TECHNIQUE: Multidetector CT imaging of the head and cervical spine was performed following the standard protocol without intravenous contrast. Multiplanar CT image reconstructions of the cervical spine were also generated. COMPARISON:  02/27/2015 head and cervical spine CT. FINDINGS: CT HEAD FINDINGS Brain: No evidence of parenchymal hemorrhage or extra-axial fluid collection. No mass lesion, mass effect, or midline shift. No CT evidence of acute infarction. Intracranial atherosclerosis. Nonspecific  moderate subcortical and periventricular white matter hypodensity, most in keeping with chronic small vessel ischemic change. Generalized cerebral volume loss. Cerebral ventricle sizes are stable and concordant with the degree of cerebral volume loss. Vascular: No hyperdense vessel or unexpected calcification. Skull: No evidence of calvarial fracture. Bilateral scleral banding. Sinuses/Orbits: The visualized paranasal sinuses are essentially clear. Other:  The mastoid air cells are unopacified. CT CERVICAL SPINE FINDINGS Alignment: Minimal 2 mm anterolisthesis at C4-5 is stable. Mild 3 mm anterolisthesis at C7-T1 is stable. No acute subluxation. Skull base and vertebrae: No acute fracture. No primary bone lesion or focal pathologic process. Soft tissues and spinal canal: No prevertebral fluid or swelling. No visible canal hematoma. Disc levels: Severe degenerative disc disease throughout the cervical spine, most prominent at C6-7, noting stable ankylosis at C3-4. Severe bilateral facet arthropathy. Moderate degenerative foraminal stenosis on the right at C2-3. Mild degenerative foraminal stenosis bilaterally at C3-4. Mild degenerative foraminal stenosis on the right at C4-5 and C5-6. Upper chest: Stable pleural-parenchymal scarring at the right greater than left lung apices. Other: Stable calcified 1.0 cm nodule at the superior right side lobe. IMPRESSION: 1. No  evidence of acute intracranial abnormality. No evidence of calvarial fracture . 2. Moderate chronic small vessel ischemia and generalized cerebral volume loss. 3. No fracture or acute malalignment in the cervical spine . 4. Severe degenerative changes in the cervical spine as described. Electronically Signed   By: Delbert Phenix M.D.   On: 04/02/2016 17:37    Procedures Procedures (including critical care time)  Medications Ordered in ED Medications  sodium chloride 0.9 % bolus 1,000 mL (not administered)  azithromycin (ZITHROMAX) 500 mg in dextrose 5 % 250 mL IVPB (not administered)  sodium chloride 0.9 % bolus 1,000 mL (1,000 mLs Intravenous New Bag/Given 04/02/16 1736)  cefTRIAXone (ROCEPHIN) 1 g in dextrose 5 % 50 mL IVPB (1 g Intravenous New Bag/Given 04/02/16 1810)     Initial Impression / Assessment and Plan / ED Course  I have reviewed the triage vital signs and the nursing notes.  Pertinent labs & imaging results that were available during my care of the patient were reviewed by me and considered in my medical decision making (see chart for details).  Clinical Course   PT brought in by EMS after fall, unclear when, at home. Concern for inability to care for self at home with his wife. He was cachectic, dirty, and dehydrated on exam. VS stable. Ecchymosis on L eye, sacral decubitus ulcer. Clothes soaked in urine. Obtained above labs as well as CT head and C-spine, CXR. Consulted social work.   Labs show lactate of 2.34, troponin 0.1, creatinine 1.34 with BUN 57, mildly elevated LFTs and bilirubin, stable CBC, CK 4826. Labwork suggestive of rhabdomyolysis and AK I. Chest x-ray shows perihilar lung opacities. The patient does have an oxygen requirement of 2-3 L. I suspect that he had an aspiration event while on the floor. The rest of his exam suggests dehydration rather than CHF. Gave her ceftriaxone and azithromycin as well as 2 L of IV fluids. Discussed admission with [facility]  hospitalist, Dr. Antionette Char, appreciate his assistance. Pt admitted for further care.  Final Clinical Impressions(s) / ED Diagnoses   Final diagnoses:  Aspiration pneumonia, unspecified aspiration pneumonia type, unspecified laterality, unspecified part of lung (HCC)  Fall in home, initial encounter  Non-traumatic rhabdomyolysis  AKI (acute kidney injury) (HCC)  Unstageable pressure ulcer, unspecified location [facility])    New Prescriptions New Prescriptions   No  medications on file     Laurence Spates, MD 04/02/16 [phone]

## 2016-04-02 NOTE — Progress Notes (Signed)
For updates please contact:  Betty( sister-in-law): [phone] Carol (neice): [phone]  Hulet Ehrmann, LCSWA Clinical Social Worker [phone]  

## 2016-04-02 NOTE — ED Notes (Signed)
Attempted to obtain EKG. Only picking up artifact. MD aware. Will attempt EKG when patient returns from Xray.

## 2016-04-02 NOTE — Progress Notes (Signed)
CSW spoke with patients family, Okey RegalCarol (niece) and Kathie RhodesBetty (sister-in-law), regarding discharge plans. Patient is from home with spouse and is unable to take care of self. Patient was found on floor for 2 days. Patients family is unsure of finances and was unaware of patients insurance. Patient has [employer] and does not have Medicaid currently in place. CSW informed patients family that patient could be placed in a short-term rehab (SNF) however this would not be a long-term placement. Family would have to make arrangements for patient after short-term rehab. Patients family understood. CSW will continue to update.   Stacy GardnerErin Collie Kittel, LCSWA Clinical Social Worker [phone]

## 2016-04-02 NOTE — ED Notes (Signed)
Attempted report to 5E. RN unavailable. 

## 2016-04-02 NOTE — ED Notes (Signed)
Bed: [id number] Expected date:  Expected time:  Means of arrival:  Comments: EMS- 82s M, confusion/found after wellness check/Hx of dementia

## 2016-04-02 NOTE — ED Notes (Signed)
Unable to get EKG I have replaced the leads and the cords and still not able to get it. Doctor is aware

## 2016-04-02 NOTE — Progress Notes (Signed)
ED CM noted nursing note ---- coming from home, living with his wife. His sister in law has been taking care of him and his wife. His sister in law is no longer able to care for the couple due to health reasons. He has a hx of dementia. It is not safe for patient to go back home/patient cannot take care of himself. It was estimated that he fell 2 days ago. EMS found patient laying in the floor, covered in feces and urine. Wife has fed him crackers and water. EMS reports pressure ulcer above his coccyx.  [facility] and text sent to ED SW

## 2016-04-02 NOTE — ED Notes (Signed)
I spoke with Dr Clarene DukeLittle prior to giving antibotics if she wanted blood cultures. Dr Clarene DukeLittle did not want those at this time. Antibotic started.

## 2016-04-02 NOTE — ED Notes (Signed)
Attempted to capture EKG, unable to capture good EKG related to artifact.

## 2016-04-02 NOTE — H&P (Signed)
History and Physical    Connor Miller DOB: 03-28-1923 DOA: 04/02/2016  PCP: Sanda Linger, MD   Patient coming from: Home  Chief Complaint: Found down   HPI: Connor Miller is a 82 y.o. male with medical history significant for hypertension, hypothyroidism, B12 deficiency with anemia, and dementia who presents to the emergency department after being found on the floor in his feces and urine. History is difficult to obtain secondary to the patient's advanced dementia, and history is therefore gleaned through discussion with the ED personnel, EMS report, and review of the EMR. Patient lives with his wife, who also has dementia by report, and has a sister-in-law that checks in on them. No one had checked on the patient and his wife for the past 2-3 weeks until is wife reportedly called 911 today to filed a missing person's report for the patient.patient was found down in a puddle of feces and urine without any specific complaints. Patient has apparently been a no-show for his PCP visits over the past year.   ED Course: Upon arrival to the ED, patient is found to be afebrile, saturating mid 80s on room air, and with vitals otherwise stable. EKG featured significant artifact and cannot be interpreted. Chest x-ray features hazy perihilar opacities which could reflect CHF, aspiration, or pneumonia. Chemistry panel features a serum creatinine 1.34 which appears stable relative to his baseline. CMP is also notable for AST of 180, ALT 93, and total bilirubin 1.8. Serum CK is elevated to 4826. CBC is notable for a stable macrocytic anemia with hemoglobin of 12.3 and a new thrombocytopenia with platelets of 113,000. Urinalysis is notable for large hemoglobin only. BNP is mildly elevated to 205, troponin is elevated to 0.10, and lactic acid is elevated to 2.34. Patient was provided with 2 L/m supplemental oxygen and his O2 saturations normalized. 2 L of normal saline were administered and he was  treated with empiric Rocephin and azithromycin for suspected community-acquired pneumonia. Patient remained hemodynamically stable in the ED and in no apparent respiratory distress. He will be admitted to the telemetry unit for ongoing evaluation and management of acute hypoxic respiratory failure suspected secondary to aspiration pneumonia, as well as rhabdomyolysis and failure to thrive.  Review of Systems:  All other systems reviewed and apart from HPI, are negative.  Past Medical History:  Diagnosis Date  . Anemia    NOS  . BPH (benign prostatic hyperplasia)   . Colon polyps   . Diverticulosis   . Hypertension   . Hypothyroidism     Past Surgical History:  Procedure Laterality Date  . COLONOSCOPY     multiple  . EYE SURGERY    . HEMORRHOID SURGERY  1972  . INGUINAL HERNIA REPAIR  04/1995  . VITRECTOMY  07/1990   for macular hole     reports that he has never smoked. He has never used smokeless tobacco. He reports that he does not drink alcohol or use drugs.  No Known Allergies  Family History  Problem Relation Age of Onset  . Lung cancer Father   . Lung cancer Brother   . Other Mother     aorta ruptured     Prior to Admission medications   Medication Sig Start Date End Date Taking? Authorizing Provider  celecoxib (CELEBREX) 200 MG capsule Take 1 capsule (200 mg total) by mouth daily. 02/08/15   Etta Grandchild, MD  Cyanocobalamin (B-12 IJ) Inject as directed every 30 (thirty) days.  Historical Provider, MD  dorzolamide-timolol (COSOPT) 22.3-6.8 MG/ML ophthalmic solution Place 1 drop into the left eye 2 (two) times daily.     Historical Provider, MD  SYNTHROID 75 MCG tablet Take 1 tablet (75 mcg total) by mouth daily. Yearly physical is due must see MD for refills 11/29/15   Etta Grandchild, MD  triamterene-hydrochlorothiazide (DYAZIDE) 37.5-25 MG capsule TAKE 1 CAPSULE DAILY 09/07/15   Etta Grandchild, MD    Physical Exam: Vitals:   04/02/16 1614 04/02/16 1616  04/02/16 1730 04/02/16 1817  BP:   (!) 153/101   Pulse:   78   Resp:   15   Temp:    98.4 F (36.9 C)  SpO2:  92% 97%   Weight: 59.4 kg (131 lb)     Height: 5\' 10"  (1.778 m)         Constitutional: No apparent respiratory distress; cachectic Eyes: PERTLA, green mucoid drainage from medial left eye ENMT: Mucous membranes are moist. Food noted in and about mouth. Posterior pharynx clear of any exudate or lesions.   Neck: normal, supple, no masses, no thyromegaly Respiratory: Normal respiratory effort. Coarse upper airway noise, rhonchi bilaterally, no pallor or cyanosis .  Cardiovascular: S1 & S2 heard, regular rate and rhythm. No extremity edema. No significant JVD. Abdomen: No distension, no tenderness, no masses palpated. Bowel sounds normal.  Musculoskeletal: no clubbing / cyanosis. No joint deformity upper and lower extremities.    Skin: Unstageable pressure sore over sacrum. Cool and dusky fingertips, otherwise warm, dry, well-perfused. Neurologic: CN 2-12 grossly intact. Sensation intact, DTR normal.    Psychiatric: Oriented to self only. Inconsistently answers "yes" or "no" to simple questioning     Labs on Admission: I have personally reviewed following labs and imaging studies  CBC:  Recent Labs Lab 04/02/16 1640  WBC 4.7  NEUTROABS 3.5  HGB 12.3*  HCT 36.0*  MCV 103.2*  PLT 113*   Basic Metabolic Panel:  Recent Labs Lab 04/02/16 1640  NA 140  K 4.3  CL 102  CO2 29  GLUCOSE 102*  BUN 57*  CREATININE 1.34*  CALCIUM 9.2   GFR: Estimated Creatinine Clearance: 30.2 mL/min (by C-G formula based on SCr of 1.34 mg/dL (H)). Liver Function Tests:  Recent Labs Lab 04/02/16 1640  AST 180*  ALT 93*  ALKPHOS 40  BILITOT 1.8*  PROT 7.2  ALBUMIN 4.2   No results for input(s): LIPASE, AMYLASE in the last 168 hours. No results for input(s): AMMONIA in the last 168 hours. Coagulation Profile: No results for input(s): INR, PROTIME in the last 168  hours. Cardiac Enzymes:  Recent Labs Lab 04/02/16 1640  CKTOTAL 4,826*   BNP (last 3 results) No results for input(s): PROBNP in the last 8760 hours. HbA1C: No results for input(s): HGBA1C in the last 72 hours. CBG: No results for input(s): GLUCAP in the last 168 hours. Lipid Profile: No results for input(s): CHOL, HDL, LDLCALC, TRIG, CHOLHDL, LDLDIRECT in the last 72 hours. Thyroid Function Tests: No results for input(s): TSH, T4TOTAL, FREET4, T3FREE, THYROIDAB in the last 72 hours. Anemia Panel: No results for input(s): VITAMINB12, FOLATE, FERRITIN, TIBC, IRON, RETICCTPCT in the last 72 hours. Urine analysis:    Component Value Date/Time   COLORURINE AMBER (A) 04/02/2016 1640   APPEARANCEUR CLOUDY (A) 04/02/2016 1640   LABSPEC 1.023 04/02/2016 1640   PHURINE 6.5 04/02/2016 1640   GLUCOSEU NEGATIVE 04/02/2016 1640   GLUCOSEU NEGATIVE 03/24/2012 1449   HGBUR LARGE (A) 04/02/2016  1640   BILIRUBINUR NEGATIVE 04/02/2016 1640   KETONESUR NEGATIVE 04/02/2016 1640   PROTEINUR 30 (A) 04/02/2016 1640   UROBILINOGEN 0.2 03/24/2012 1449   NITRITE NEGATIVE 04/02/2016 1640   LEUKOCYTESUR NEGATIVE 04/02/2016 1640   Sepsis Labs: @LABRCNTIP (procalcitonin:4,lacticidven:4) )No results found for this or any previous visit (from the past 240 hour(s)).   Radiological Exams on Admission: Dg Chest 2 View  Result Date: 04/02/2016 CLINICAL DATA:  Unwitnessed fall EXAM: CHEST  2 VIEW COMPARISON:  02/27/2015 chest radiograph. FINDINGS: Left rotated chest radiograph. Stable cardiomediastinal silhouette with borderline mild cardiomegaly and aortic atherosclerosis. No pneumothorax. Trace bilateral pleural effusions. There are hazy bilateral parahilar opacities, most prominent in the right upper lung. No displaced fractures. IMPRESSION: Hazy parahilar lung opacities, most prominent in the right upper lung, potentially representing mild pulmonary edema given the borderline cardiomegaly and trace  bilateral pleural effusions, with the differential including infection or aspiration. Recommend follow-up PA and lateral post treatment chest radiographs in 4-6 weeks. Electronically Signed   By: Delbert PhenixJason A Poff M.D.   On: 04/02/2016 17:16   Ct Head Wo Contrast  Result Date: 04/02/2016 CLINICAL DATA:  Unwitnessed fall. EXAM: CT HEAD WITHOUT CONTRAST CT CERVICAL SPINE WITHOUT CONTRAST TECHNIQUE: Multidetector CT imaging of the head and cervical spine was performed following the standard protocol without intravenous contrast. Multiplanar CT image reconstructions of the cervical spine were also generated. COMPARISON:  02/27/2015 head and cervical spine CT. FINDINGS: CT HEAD FINDINGS Brain: No evidence of parenchymal hemorrhage or extra-axial fluid collection. No mass lesion, mass effect, or midline shift. No CT evidence of acute infarction. Intracranial atherosclerosis. Nonspecific moderate subcortical and periventricular white matter hypodensity, most in keeping with chronic small vessel ischemic change. Generalized cerebral volume loss. Cerebral ventricle sizes are stable and concordant with the degree of cerebral volume loss. Vascular: No hyperdense vessel or unexpected calcification. Skull: No evidence of calvarial fracture. Bilateral scleral banding. Sinuses/Orbits: The visualized paranasal sinuses are essentially clear. Other:  The mastoid air cells are unopacified. CT CERVICAL SPINE FINDINGS Alignment: Minimal 2 mm anterolisthesis at C4-5 is stable. Mild 3 mm anterolisthesis at C7-T1 is stable. No acute subluxation. Skull base and vertebrae: No acute fracture. No primary bone lesion or focal pathologic process. Soft tissues and spinal canal: No prevertebral fluid or swelling. No visible canal hematoma. Disc levels: Severe degenerative disc disease throughout the cervical spine, most prominent at C6-7, noting stable ankylosis at C3-4. Severe bilateral facet arthropathy. Moderate degenerative foraminal stenosis  on the right at C2-3. Mild degenerative foraminal stenosis bilaterally at C3-4. Mild degenerative foraminal stenosis on the right at C4-5 and C5-6. Upper chest: Stable pleural-parenchymal scarring at the right greater than left lung apices. Other: Stable calcified 1.0 cm nodule at the superior right side lobe. IMPRESSION: 1. No evidence of acute intracranial abnormality. No evidence of calvarial fracture . 2. Moderate chronic small vessel ischemia and generalized cerebral volume loss. 3. No fracture or acute malalignment in the cervical spine . 4. Severe degenerative changes in the cervical spine as described. Electronically Signed   By: Delbert PhenixJason A Poff M.D.   On: 04/02/2016 17:37   Ct Cervical Spine Wo Contrast  Result Date: 04/02/2016 CLINICAL DATA:  Unwitnessed fall. EXAM: CT HEAD WITHOUT CONTRAST CT CERVICAL SPINE WITHOUT CONTRAST TECHNIQUE: Multidetector CT imaging of the head and cervical spine was performed following the standard protocol without intravenous contrast. Multiplanar CT image reconstructions of the cervical spine were also generated. COMPARISON:  02/27/2015 head and cervical spine CT. FINDINGS: CT HEAD FINDINGS  Brain: No evidence of parenchymal hemorrhage or extra-axial fluid collection. No mass lesion, mass effect, or midline shift. No CT evidence of acute infarction. Intracranial atherosclerosis. Nonspecific moderate subcortical and periventricular white matter hypodensity, most in keeping with chronic small vessel ischemic change. Generalized cerebral volume loss. Cerebral ventricle sizes are stable and concordant with the degree of cerebral volume loss. Vascular: No hyperdense vessel or unexpected calcification. Skull: No evidence of calvarial fracture. Bilateral scleral banding. Sinuses/Orbits: The visualized paranasal sinuses are essentially clear. Other:  The mastoid air cells are unopacified. CT CERVICAL SPINE FINDINGS Alignment: Minimal 2 mm anterolisthesis at C4-5 is stable. Mild 3 mm  anterolisthesis at C7-T1 is stable. No acute subluxation. Skull base and vertebrae: No acute fracture. No primary bone lesion or focal pathologic process. Soft tissues and spinal canal: No prevertebral fluid or swelling. No visible canal hematoma. Disc levels: Severe degenerative disc disease throughout the cervical spine, most prominent at C6-7, noting stable ankylosis at C3-4. Severe bilateral facet arthropathy. Moderate degenerative foraminal stenosis on the right at C2-3. Mild degenerative foraminal stenosis bilaterally at C3-4. Mild degenerative foraminal stenosis on the right at C4-5 and C5-6. Upper chest: Stable pleural-parenchymal scarring at the right greater than left lung apices. Other: Stable calcified 1.0 cm nodule at the superior right side lobe. IMPRESSION: 1. No evidence of acute intracranial abnormality. No evidence of calvarial fracture . 2. Moderate chronic small vessel ischemia and generalized cerebral volume loss. 3. No fracture or acute malalignment in the cervical spine . 4. Severe degenerative changes in the cervical spine as described. Electronically Signed   By: Delbert Phenix M.D.   On: 04/02/2016 17:37    EKG: Independently reviewed. Significant artifact, repeat pending  Assessment/Plan  1. Pneumonia, likely aspiration, with acute hypoxic respiratory failure - Saturating 85% on room air initially, requiring 3 Lpm to maintain sat >92%  - There is no fever or leukocytosis; lactic acid is elevated, pro-calcitonin pending  - In light of the clinical scenario with food particles in and around his mouth, CXR findings likely represent an aspiration PNA  - Empiric Rocephin and azithromycin administered in ED; will change to Unasyn  - NPO; SLP eval requested  - Wean supplemental O2 as tolerated    2. Rhabdomyolysis  - CK is 4826 on admission with preserved renal fxn  - Patient was found down at home with uncertain duration  - He was given 2 liters NS in ED and will be continued on  NS infusion  - CK will be repeated in am   3. Macrocytic anemia, thrombocytopenia - Hgb is stable on admission at 12.3; MCV is 103.2  - He carries diagnosis of B12-deficiency and had apparently been receiving monthly injections, but appears he had no-showed for PCP visits this past yr  - Platelets low at 113,00 without s/s of bleeding; platelets had previously been normal; possibly secondary to the current infection - Check B12, folate, and iron studies; TSH also pending   6. CKD state III  - SCr 1.34 on admission, appears close to baseline  - He was given 2 L NS in ED and is continued on IVF hydration with NS  - Hold triamterene-HCTZ for now, resume as appropriate   7. Troponin elevation   - Troponin is elevated to 0.10 in ED  - Pt denies CP; EKG is difficult to interpret d/t significant artifact despite multiple repeat attempts - Monitor on telemetry; try repeating EKG up on the floor  - Suspect this represents a demand  ischemia in setting of the acute illness with hypoxia; will follow serial troponin, anticipating flat or downward trend with treatment of the underlying illnesses  8. Elevated transaminases and bilirubin  - AST 180, ALT 93, total bilirubin 1.8 on admission; these had previously been normal  - Abdominal exam is benign  - Unclear etiology, likely secondary to rhabdomyolysis, will check RUQ [place]   9. Hypertension - At goal currently  - Triamterene-HCTZ held on admission while pt is being hydrated    10. Hypothyroidism - Will check TSH and free T4 given the presentation  - Continue current-dose Synthroid for now   DVT prophylaxis: sq heparin Code Status: Full; pt unable to communicate wishes and wife also has dementia and is also currently ill in hospital; additional family with numbers listed in chart were not available at the listed numbers Family Communication: None available at time of admission Disposition Plan: Admit to telemetry Consults called:  None Admission status: Inpatient    Briscoe Deutscher, MD [facility] Pager [phone]  If 7PM-7AM, please contact night-coverage [email or web address] Password [email or web address]  04/02/2016, 7:03 PM

## 2016-04-02 NOTE — Progress Notes (Signed)
Pharmacy Antibiotic Note  Connor Miller is a 82 y.o. male admitted on 04/02/2016 after unwitnessed fall and down x 2 days.  CXR concerning for possible aspiration pneumonia.  Pharmacy has been consulted for Unasyn dosing.  CrCl noted ~30 ml/min.  NKDA.  Ceftriaxone x1 given in ED.   Plan: Unasyn 3g IV q12h F/u renal function, cultures, clinical course  Height: 5\' 10"  (177.8 cm) Weight: 131 lb (59.4 kg) IBW/kg (Calculated) : 73  Temp (24hrs), Avg:98.5 F (36.9 C), Min:98.4 F (36.9 C), Max:98.5 F (36.9 C)   Recent Labs Lab 04/02/16 1640 04/02/16 1701  WBC 4.7  --   CREATININE 1.34*  --   LATICACIDVEN  --  2.34*    Estimated Creatinine Clearance: 30.2 mL/min (by C-G formula based on SCr of 1.34 mg/dL (H)).    No Known Allergies  Antimicrobials this admission: 10/25 Ceftriaxone x1 10/25 Unasyn >>   Dose adjustments this admission:  Microbiology results:  Thank you for allowing pharmacy to be a part of this patient's care.  Wynonia HazardSumme, Abdulkadir Emmanuel E 04/02/2016 7:02 PM

## 2016-04-02 NOTE — ED Notes (Signed)
RN and NT attempted EKG without success. EKG is reading with artifact. MD is aware.

## 2016-04-02 NOTE — ED Notes (Signed)
EDP made aware of patient troponin result. 

## 2016-04-02 NOTE — ED Notes (Signed)
Attempted to capture EKG. Unable to do so due to so much artifact. Dr Clarene DukeLittle aware.

## 2016-04-02 NOTE — ED Notes (Signed)
MD at bedside. 

## 2016-04-02 NOTE — ED Triage Notes (Signed)
Patient is coming from home, living with his wife. His sister in law has been taking care of him and his wife. His sister in law is no longer able to care for the couple due to health reasons. He has a hx of dementia. It is not safe for patient to go back home/patient cannot take care of himself. It was estimated that he fell 2 days ago. EMS found patient laying in the floor, covered in feces and urine. Wife has fed him crackers and water. EMS reports pressure ulcer above his coccyx.

## 2016-04-03 ENCOUNTER — Inpatient Hospital Stay (HOSPITAL_COMMUNITY): Payer: Medicare Other

## 2016-04-03 DIAGNOSIS — F0390 Unspecified dementia without behavioral disturbance: Secondary | ICD-10-CM

## 2016-04-03 LAB — CBC WITH DIFFERENTIAL/PLATELET
BASOS ABS: 0 10*3/uL (ref 0.0–0.1)
BASOS PCT: 0 %
EOS ABS: 0 10*3/uL (ref 0.0–0.7)
EOS PCT: 0 %
HCT: 29.3 % — ABNORMAL LOW (ref 39.0–52.0)
Hemoglobin: 10.2 g/dL — ABNORMAL LOW (ref 13.0–17.0)
LYMPHS PCT: 11 %
Lymphs Abs: 0.4 10*3/uL — ABNORMAL LOW (ref 0.7–4.0)
MCH: 36.2 pg — ABNORMAL HIGH (ref 26.0–34.0)
MCHC: 34.8 g/dL (ref 30.0–36.0)
MCV: 103.9 fL — AB (ref 78.0–100.0)
Monocytes Absolute: 0.4 10*3/uL (ref 0.1–1.0)
Monocytes Relative: 11 %
Neutro Abs: 2.9 10*3/uL (ref 1.7–7.7)
Neutrophils Relative %: 77 %
PLATELETS: 90 10*3/uL — AB (ref 150–400)
RBC: 2.82 MIL/uL — AB (ref 4.22–5.81)
RDW: 14.8 % (ref 11.5–15.5)
WBC: 3.8 10*3/uL — AB (ref 4.0–10.5)

## 2016-04-03 LAB — COMPREHENSIVE METABOLIC PANEL
ALT: 75 U/L — ABNORMAL HIGH (ref 17–63)
ANION GAP: 8 (ref 5–15)
AST: 121 U/L — AB (ref 15–41)
Albumin: 3.2 g/dL — ABNORMAL LOW (ref 3.5–5.0)
Alkaline Phosphatase: 32 U/L — ABNORMAL LOW (ref 38–126)
BILIRUBIN TOTAL: 1.1 mg/dL (ref 0.3–1.2)
BUN: 55 mg/dL — AB (ref 6–20)
CALCIUM: 8.3 mg/dL — AB (ref 8.9–10.3)
CO2: 27 mmol/L (ref 22–32)
Chloride: 108 mmol/L (ref 101–111)
Creatinine, Ser: 1.21 mg/dL (ref 0.61–1.24)
GFR calc Af Amer: 58 mL/min — ABNORMAL LOW (ref >60)
GFR, EST NON AFRICAN AMERICAN: 50 mL/min — AB (ref >60)
Glucose, Bld: 84 mg/dL (ref 65–99)
POTASSIUM: 3.7 mmol/L (ref 3.5–5.1)
Sodium: 143 mmol/L (ref 135–145)
TOTAL PROTEIN: 5.5 g/dL — AB (ref 6.5–8.1)

## 2016-04-03 LAB — LACTIC ACID, PLASMA: LACTIC ACID, VENOUS: 1.2 mmol/L (ref 0.5–1.9)

## 2016-04-03 LAB — IRON AND TIBC
IRON: 29 ug/dL — AB (ref 45–182)
Saturation Ratios: 13 % — ABNORMAL LOW (ref 17.9–39.5)
TIBC: 228 ug/dL — ABNORMAL LOW (ref 250–450)
UIBC: 199 ug/dL

## 2016-04-03 LAB — T4, FREE: FREE T4: 0.55 ng/dL — AB (ref 0.61–1.12)

## 2016-04-03 LAB — FERRITIN: Ferritin: 241 ng/mL (ref 24–336)

## 2016-04-03 LAB — TROPONIN I
TROPONIN I: 0.11 ng/mL — AB (ref <0.03)
TROPONIN I: 0.09 ng/mL — AB (ref <0.03)
Troponin I: 0.09 ng/mL (ref <0.03)

## 2016-04-03 LAB — CK: Total CK: 2900 U/L — ABNORMAL HIGH (ref 49–397)

## 2016-04-03 LAB — VITAMIN B12: VITAMIN B 12: 316 pg/mL (ref 180–914)

## 2016-04-03 MED ORDER — COLLAGENASE 250 UNIT/GM EX OINT
TOPICAL_OINTMENT | Freq: Every day | CUTANEOUS | Status: DC
  Administered 2016-04-03 – 2016-04-11 (×9): via TOPICAL
  Filled 2016-04-03: qty 30

## 2016-04-03 MED ORDER — LIP MEDEX EX OINT
TOPICAL_OINTMENT | CUTANEOUS | Status: AC
  Administered 2016-04-03: 1
  Filled 2016-04-03: qty 7

## 2016-04-03 MED ORDER — CHLORHEXIDINE GLUCONATE 0.12 % MT SOLN
15.0000 mL | Freq: Two times a day (BID) | OROMUCOSAL | Status: DC
  Administered 2016-04-03 – 2016-04-11 (×17): 15 mL via OROMUCOSAL
  Filled 2016-04-03 (×15): qty 15

## 2016-04-03 MED ORDER — SODIUM CHLORIDE 0.9 % IV SOLN
3.0000 g | Freq: Three times a day (TID) | INTRAVENOUS | Status: DC
  Administered 2016-04-03 – 2016-04-06 (×10): 3 g via INTRAVENOUS
  Filled 2016-04-03 (×11): qty 3

## 2016-04-03 MED ORDER — KCL IN DEXTROSE-NACL 20-5-0.45 MEQ/L-%-% IV SOLN
INTRAVENOUS | Status: DC
  Administered 2016-04-03 – 2016-04-05 (×4): via INTRAVENOUS
  Filled 2016-04-03 (×6): qty 1000

## 2016-04-03 NOTE — Consult Note (Signed)
WOC Nurse wound consult note Reason for Consult:Unstageable pressure injury to sacrum, 4 Stage 2 pressure injuries to the scapulae and mid-thoracic spine Wound type:Pressure Pressure Ulcer POA: Yes Measurement:Sacral:  5.5cm x 5cm with depth obscured by the presence of black necrotic tissue (soft/thin, adherent).  Bilateral scapulae, mid-thoracic spine:  4 partial thickness ulcerations, the largest of which measures 2cm x 1.5cm x 0.2cm.  Pink, moist wound bed. Wound bed:AS described above Drainage (amount, consistency, odor) scant serous Periwound:intact, dry Dressing procedure/placement/frequency: Patient to be placed on a mattress replacement with low air loss feature and turned side to side, minimizing time spent in the supine position.  He will have provided with pressure redistribution heel boots for his intact, but vulnerable heels.  Topical wound care is ordered. A higher level of care is recommended upon discharge. WOC nursing team will not follow, but will remain available to this patient, the nursing and medical teams.  Please re-consult if needed. Thanks, Ladona MowLaurie Tammi Boulier, MSN, RN, GNP, Hans EdenCWOCN, CWON-AP, FAAN  Pager# [phone]

## 2016-04-03 NOTE — Progress Notes (Signed)
Patient admission history not able to document at this time due to patient having history of dementia. Patient does not have any family present with him. His wife is currently patient in [facility]. Will pass to next RN to complete.

## 2016-04-03 NOTE — Evaluation (Signed)
SLP Cancellation Note  Patient Details Name: Connor Miller MRN: [id number] DOB: October 06, 1922   Cancelled treatment:       Reason Eval/Treat Not Completed: Other (comment) (pt npo for ultrasound)   [name], [name] Ann 04/03/2016, 8:50 AM

## 2016-04-03 NOTE — Progress Notes (Signed)
Pharmacy Antibiotic Note  Connor Miller is a 82 y.o. male admitted on 04/02/2016 after unwitnessed fall and down x 2 days.  CXR concerning for possible aspiration pneumonia.  Pharmacy has been consulted for Unasyn dosing.  CrCl noted <30 ml/min on admission.  NKDA.  Ceftriaxone x1 given in ED. SCr has slightly improved today.  Plan: - Increase Unasyn 3g IV q8h since CrCl now > 30 ml/min - F/u renal function, cultures, clinical course  Height: 5\' 10"  (177.8 cm) Weight: 128 lb 12 oz (58.4 kg) IBW/kg (Calculated) : 73  Temp (24hrs), Avg:98.2 F (36.8 C), Min:97.5 F (36.4 C), Max:98.5 F (36.9 C)   Recent Labs Lab 04/02/16 1640 04/02/16 1701 04/02/16 2048 04/03/16 0000 04/03/16 0424  WBC 4.7  --   --   --  3.8*  CREATININE 1.34*  --   --   --  1.21  LATICACIDVEN  --  2.34* 1.4 1.2  --     Estimated Creatinine Clearance: 32.8 mL/min (by C-G formula based on SCr of 1.21 mg/dL).    No Known Allergies  Antimicrobials this admission: 10/25 Ceftriaxone x1 10/25 Unasyn >>   Dose adjustments this admission:  Microbiology results:  10/25 Urine strep Ag: neg 10/25 Urine legionella Ag: in process  Thank you for allowing pharmacy to be a part of this patient's care.  Loralee PacasErin Melainie Krinsky, PharmD, BCPS Pager: [phone] 04/03/2016 10:07 AM

## 2016-04-03 NOTE — Clinical Social Work Note (Signed)
Clinical Social Work Assessment  Patient Details  Name: Connor Miller MRN: [id number] Date of Birth: 1922/12/28  Date of referral:  04/03/16               Reason for consult:  Facility Placement                Permission sought to share information with:  Family Supports, Magazine features editoracility Contact Representative Permission granted to share information::     Name::     Harriet PhoBetty Weaver (Sister in Social workerlaw)  Agency::  SNF  Relationship::  Sister  Contact Information:  [phone]  Housing/Transportation Living arrangements for the past 2 months:  Single Family Home Source of Information:  Other (Comment Required) (Sister in Social workerlaw) Patient Interpreter Needed:  None Criminal Activity/Legal Involvement Pertinent to Current Situation/Hospitalization:  No - Comment as needed Significant Relationships:  Siblings Lives with:  Spouse Do you feel safe going back to the place where you live?  No Need for family participation in patient care:  Yes (Comment)  Care giving concerns: Patient lives with wife that has dementia, patient was found laying in feces, only fed crackers and water. Pt. family is not sure how long the patient was laying in floor. [facility]. The patient will need physical therapy and Capacity? consult.  The sister in law believes the patient and his wife will give pushback about going to SNF- despite not being able to care for one another at this time.    Social Worker assessment / plan: LCSWA will assist family with disposition to SNF. Patient sister in law reports she would like for patient [place]. The plan is to place both husband and wife at same facility therefore family can continue to assist with care.  Patient sister in law reports the phone has been off and bills have not been paid at the pts. Home and there is no way they can continue to manage without support.  Fax out for bed offers.   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   Patient/Family's Understanding of and Emotional Response to Diagnosis, Current Treatment, and Prognosis: Patient family want to be involved in patient care.  Emotional Assessment Appearance:  Appears stated age Attitude/Demeanor/Rapport:    Affect (typically observed):  Calm Orientation:  Oriented to Self Alcohol / Substance use:  Not Applicable Psych involvement (Current and /or in the community):  Capacity?  Discharge Needs  Concerns to be addressed:  Care Coordination, Discharge Planning Concerns, Lack of Support Readmission within the last 30 days:    Current discharge risk:  Dependent with Mobility, Lack of support system Barriers to Discharge:  Continued Medical Work up, Unsafe home situation   Clearance Cootsicole A Alyssa Mancera, LCSW 04/03/2016, 2:22 PM

## 2016-04-03 NOTE — Progress Notes (Signed)
PROGRESS NOTE    Connor Miller  ZOX:[id number] DOB: 02-15-1923 DOA: 04/02/2016 PCP: Sanda Linger, MD   Brief Narrative:  82 y.o. male with medical history significant for hypertension, hypothyroidism, B12 deficiency with anemia, and dementia who presents to the emergency department after being found on the floor in his feces and urine. Pt presented with pneumonia likely aspiration, rhabdomyolysis, and elevated troponin with no reported chest pain.   Assessment & Plan:   Principal Problem:   CAP (community acquired pneumonia) - Pt currently on Unasyn - obtained speech therapy evaluation who is currently suspecting that patient is aspirating with all intake. - obtain MBS to further evaluate  Active Problems:   Hypothyroidism - pt currently on synthroid    Essential hypertension, benign - normotensive off blood pressure medication    Failure to thrive in adult - up with assistance - obtain PT evaluation    Rhabdomyolysis - continue IVF rehydration most likely contributed to elevated troponin level  Elevated troponin - secondary most likely due to demand ischemia most likely from pneumonia and or rhabdomyolysis. No chest pain reported.    Decubitus ulcer - continue wound care consult    Dementia   CKD (chronic kidney disease), stage III - stable   Macrocytic anemia with vitamin B12 deficiency   Thrombocytopenia (HCC)   Abnormal LFTs   DVT prophylaxis: Heparin Code Status: Full Family Communication: None at bedside Disposition Plan: pending improvement in condition   Consultants:   none   Procedures: None   Antimicrobials: pt on unasyn   Subjective: Pt reports no new complaints  Objective: Vitals:   04/02/16 1817 04/02/16 2007 04/03/16 0555 04/03/16 1520  BP:  (!) 151/94 132/72 127/85  Pulse:  78 61 65  Resp:  14 16 18   Temp: 98.4 F (36.9 C) 97.5 F (36.4 C) 98.4 F (36.9 C) 98.7 F (37.1 C)  TempSrc:  Oral Oral Oral  SpO2:    100%    Weight:  58 kg (127 lb 13.9 oz) 58.4 kg (128 lb 12 oz)   Height:        Intake/Output Summary (Last 24 hours) at 04/03/16 1622 Last data filed at 04/03/16 0717  Gross per 24 hour  Intake              680 ml  Output              200 ml  Net              480 ml   Filed Weights   04/02/16 1614 04/02/16 2007 04/03/16 0555  Weight: 59.4 kg (131 lb) 58 kg (127 lb 13.9 oz) 58.4 kg (128 lb 12 oz)    Examination:  General exam: Appears calm and comfortable, in nad. Respiratory system: poor inspiratory effort, rhales, no wheezes Cardiovascular system: S1 & S2 heard, RRR. No JVD, murmurs, rubs, gallops or clicks.  Gastrointestinal system: Abdomen is nondistended, soft and nontender. No organomegaly or masses felt. Normal bowel sounds heard. Central nervous system: moves extremities equally, no facial asymmetry Extremities: Symmetric 5 x 5 power. Skin: + ulcer sacrum 4 stage 2 pressure ulcers to scapulae and midthoracic spine Psychiatry: unable to accurately assess due to limited cooperation with examiner     Data Reviewed: I have personally reviewed following labs and imaging studies  CBC:  Recent Labs Lab 04/02/16 1640 04/03/16 0424  WBC 4.7 3.8*  NEUTROABS 3.5 2.9  HGB 12.3* 10.2*  HCT 36.0* 29.3*  MCV 103.2* 103.9*  PLT 113* 90*   Basic Metabolic Panel:  Recent Labs Lab 04/02/16 1640 04/03/16 0424  NA 140 143  K 4.3 3.7  CL 102 108  CO2 29 27  GLUCOSE 102* 84  BUN 57* 55*  CREATININE 1.34* 1.21  CALCIUM 9.2 8.3*   GFR: Estimated Creatinine Clearance: 32.8 mL/min (by C-G formula based on SCr of 1.21 mg/dL). Liver Function Tests:  Recent Labs Lab 04/02/16 1640 04/03/16 0424  AST 180* 121*  ALT 93* 75*  ALKPHOS 40 32*  BILITOT 1.8* 1.1  PROT 7.2 5.5*  ALBUMIN 4.2 3.2*   No results for input(s): LIPASE, AMYLASE in the last 168 hours. No results for input(s): AMMONIA in the last 168 hours. Coagulation Profile: No results for input(s): INR, PROTIME in  the last 168 hours. Cardiac Enzymes:  Recent Labs Lab 04/02/16 1640 04/02/16 2031 04/03/16 0424 04/03/16 1027  CKTOTAL 4,826*  --  2,900*  --   TROPONINI  --  0.10* 0.11* 0.09*   BNP (last 3 results) No results for input(s): PROBNP in the last 8760 hours. HbA1C: No results for input(s): HGBA1C in the last 72 hours. CBG: No results for input(s): GLUCAP in the last 168 hours. Lipid Profile: No results for input(s): CHOL, HDL, LDLCALC, TRIG, CHOLHDL, LDLDIRECT in the last 72 hours. Thyroid Function Tests:  Recent Labs  04/02/16 2048  TSH 33.990*  FREET4 0.55*   Anemia Panel:  Recent Labs  04/02/16 2048  VITAMINB12 316  FERRITIN 241  TIBC 228*  IRON 29*   Sepsis Labs:  Recent Labs Lab 04/02/16 1701 04/02/16 2048 04/03/16 0000  PROCALCITON  --  2.53  --   LATICACIDVEN 2.34* 1.4 1.2    No results found for this or any previous visit (from the past 240 hour(s)).       Radiology Studies: Dg Chest 2 View  Result Date: 04/02/2016 CLINICAL DATA:  Unwitnessed fall EXAM: CHEST  2 VIEW COMPARISON:  02/27/2015 chest radiograph. FINDINGS: Left rotated chest radiograph. Stable cardiomediastinal silhouette with borderline mild cardiomegaly and aortic atherosclerosis. No pneumothorax. Trace bilateral pleural effusions. There are hazy bilateral parahilar opacities, most prominent in the right upper lung. No displaced fractures. IMPRESSION: Hazy parahilar lung opacities, most prominent in the right upper lung, potentially representing mild pulmonary edema given the borderline cardiomegaly and trace bilateral pleural effusions, with the differential including infection or aspiration. Recommend follow-up PA and lateral post treatment chest radiographs in 4-6 weeks. Electronically Signed   By: Delbert PhenixJason A Poff M.D.   On: 04/02/2016 17:16   Ct Head Wo Contrast  Result Date: 04/02/2016 CLINICAL DATA:  Unwitnessed fall. EXAM: CT HEAD WITHOUT CONTRAST CT CERVICAL SPINE WITHOUT  CONTRAST TECHNIQUE: Multidetector CT imaging of the head and cervical spine was performed following the standard protocol without intravenous contrast. Multiplanar CT image reconstructions of the cervical spine were also generated. COMPARISON:  02/27/2015 head and cervical spine CT. FINDINGS: CT HEAD FINDINGS Brain: No evidence of parenchymal hemorrhage or extra-axial fluid collection. No mass lesion, mass effect, or midline shift. No CT evidence of acute infarction. Intracranial atherosclerosis. Nonspecific moderate subcortical and periventricular white matter hypodensity, most in keeping with chronic small vessel ischemic change. Generalized cerebral volume loss. Cerebral ventricle sizes are stable and concordant with the degree of cerebral volume loss. Vascular: No hyperdense vessel or unexpected calcification. Skull: No evidence of calvarial fracture. Bilateral scleral banding. Sinuses/Orbits: The visualized paranasal sinuses are essentially clear. Other:  The mastoid air cells are unopacified. CT CERVICAL SPINE FINDINGS Alignment: Minimal  2 mm anterolisthesis at C4-5 is stable. Mild 3 mm anterolisthesis at C7-T1 is stable. No acute subluxation. Skull base and vertebrae: No acute fracture. No primary bone lesion or focal pathologic process. Soft tissues and spinal canal: No prevertebral fluid or swelling. No visible canal hematoma. Disc levels: Severe degenerative disc disease throughout the cervical spine, most prominent at C6-7, noting stable ankylosis at C3-4. Severe bilateral facet arthropathy. Moderate degenerative foraminal stenosis on the right at C2-3. Mild degenerative foraminal stenosis bilaterally at C3-4. Mild degenerative foraminal stenosis on the right at C4-5 and C5-6. Upper chest: Stable pleural-parenchymal scarring at the right greater than left lung apices. Other: Stable calcified 1.0 cm nodule at the superior right side lobe. IMPRESSION: 1. No evidence of acute intracranial abnormality. No  evidence of calvarial fracture . 2. Moderate chronic small vessel ischemia and generalized cerebral volume loss. 3. No fracture or acute malalignment in the cervical spine . 4. Severe degenerative changes in the cervical spine as described. Electronically Signed   By: Delbert Phenix M.D.   On: 04/02/2016 17:37   Ct Cervical Spine Wo Contrast  Result Date: 04/02/2016 CLINICAL DATA:  Unwitnessed fall. EXAM: CT HEAD WITHOUT CONTRAST CT CERVICAL SPINE WITHOUT CONTRAST TECHNIQUE: Multidetector CT imaging of the head and cervical spine was performed following the standard protocol without intravenous contrast. Multiplanar CT image reconstructions of the cervical spine were also generated. COMPARISON:  02/27/2015 head and cervical spine CT. FINDINGS: CT HEAD FINDINGS Brain: No evidence of parenchymal hemorrhage or extra-axial fluid collection. No mass lesion, mass effect, or midline shift. No CT evidence of acute infarction. Intracranial atherosclerosis. Nonspecific moderate subcortical and periventricular white matter hypodensity, most in keeping with chronic small vessel ischemic change. Generalized cerebral volume loss. Cerebral ventricle sizes are stable and concordant with the degree of cerebral volume loss. Vascular: No hyperdense vessel or unexpected calcification. Skull: No evidence of calvarial fracture. Bilateral scleral banding. Sinuses/Orbits: The visualized paranasal sinuses are essentially clear. Other:  The mastoid air cells are unopacified. CT CERVICAL SPINE FINDINGS Alignment: Minimal 2 mm anterolisthesis at C4-5 is stable. Mild 3 mm anterolisthesis at C7-T1 is stable. No acute subluxation. Skull base and vertebrae: No acute fracture. No primary bone lesion or focal pathologic process. Soft tissues and spinal canal: No prevertebral fluid or swelling. No visible canal hematoma. Disc levels: Severe degenerative disc disease throughout the cervical spine, most prominent at C6-7, noting stable ankylosis at  C3-4. Severe bilateral facet arthropathy. Moderate degenerative foraminal stenosis on the right at C2-3. Mild degenerative foraminal stenosis bilaterally at C3-4. Mild degenerative foraminal stenosis on the right at C4-5 and C5-6. Upper chest: Stable pleural-parenchymal scarring at the right greater than left lung apices. Other: Stable calcified 1.0 cm nodule at the superior right side lobe. IMPRESSION: 1. No evidence of acute intracranial abnormality. No evidence of calvarial fracture . 2. Moderate chronic small vessel ischemia and generalized cerebral volume loss. 3. No fracture or acute malalignment in the cervical spine . 4. Severe degenerative changes in the cervical spine as described. Electronically Signed   By: Delbert Phenix M.D.   On: 04/02/2016 17:37   US Abdomen Limited Ruq  Result Date: 04/03/2016 CLINICAL DATA:  Abnormal LFTs EXAM: US ABDOMEN LIMITED - RIGHT UPPER QUADRANT COMPARISON:  10/16/2006 CT abdomen with contrast FINDINGS: Gallbladder: Limited visualization because of upper abdominal obscuring bowel gas. No definite gallstones, wall thickening or pericholecystic fluid. Wall thickness measures 2 mm. No Murphy's sign. Common bile duct: Diameter: 1.7 mm. Liver: Also limited because  of overlying obscuring bowel gas. No definite focal abnormality or intrahepatic biliary dilatation. Patent hepatic and portal veins with normal directional flow. Small right effusion noted during the exam. IMPRESSION: Limited exam because of bowel gas but no definite acute finding by ultrasound. No biliary dilatation Small right pleural effusion Electronically Signed   By: Judie Petit.  Shick M.D.   On: 04/03/2016 08:38        Scheduled Meds: . lip balm      . ampicillin-sulbactam (UNASYN) IV  3 g Intravenous Q8H  . chlorhexidine  15 mL Mouth/Throat BID  . collagenase   Topical Daily  . dorzolamide-timolol  1 drop Left Eye BID  . erythromycin   Both Eyes Q6H  . heparin  5,000 Units Subcutaneous Q8H  .  levothyroxine  75 mcg Oral QAC breakfast  . sodium chloride  1,000 mL Intravenous Once   Continuous Infusions:    LOS: 1 day   Time spent: > 35 minutes  Penny Pia, MD [facility] Pager [phone]  If 7PM-7AM, please contact night-coverage [email or web address] Password [email or web address] 04/03/2016, 4:22 PM

## 2016-04-03 NOTE — Evaluation (Addendum)
Clinical/Bedside Swallow Evaluation Patient Details  Name: Connor Miller MRN: [id number] Date of Birth: [birth year]  Today's Date: 04/03/2016 Time: SLP Start Time (ACUTE ONLY): 1228 SLP Stop Time (ACUTE ONLY): 1258 SLP Time Calculation (min) (ACUTE ONLY): 30 min  Past Medical History:  Past Medical History:  Diagnosis Date  . Anemia    NOS  . BPH (benign prostatic hyperplasia)   . Colon polyps   . Diverticulosis   . Hypertension   . Hypothyroidism    Past Surgical History:  Past Surgical History:  Procedure Laterality Date  . COLONOSCOPY     multiple  . EYE SURGERY    . HEMORRHOID SURGERY  1972  . INGUINAL HERNIA REPAIR  04/1995  . VITRECTOMY  07/1990   for macular hole   HPI:  Pt found down after multiple days.  Pt has degenerative cervical spine disease.  Chest x-ray features hazy perihilar opacities which could reflect CHF, aspiration, or pneumonia per MD note.  PMH + for dementia, anemia, diverticulosis.  US unremarkable except pleural effusion.  Pt was provided with 2 L/m supplemental oxygen and his O2 saturations normalized in ED per notes.  Antibiotics started for suspected community-acquired pneumonia.  Pt admited for aspiration pneumonia, as well as rhabdomyolysis and failure to thrive per ED notes.     Assessment / Plan / Recommendation Clinical Impression  Pt presents with symptoms of pharyngeal and possible esophageal dysphagia with concerns for aspiration/gross residuals.  Pt's voice is clear and strong at baseline however once he consumes ANY intake, swallow is followed by multiple swallows, immediate throat clearing and coughing.  SLP suspects pt is aspirating with all intake.   Pt did report sensation of applesauce pharyngeal residuals but reports thin liquid swallows cleared it.     Pt with poor awareness to signs of overt throat clearing, coughing after EVERY swallow until SlP made him aware.  He denies coughing/chronic throat clearing with intake prior to  admission but uncertain to his historical abilities.    SLP suspects deconditioning may be contributing significantly to difficulties/dysphagia and pt may have functional improvement with medical improvement.    As pt clearly desires po intake *readily accepted and wants to eat* options include po diet with ACCEPTED aspiration risks vs MBS to allow instrumental evaluation to see if any po may be tolerated.   During clinical evaluation, dysphagia symptoms did not improve with varying consistencies or strategies.  Educated pt to concerns using teach back and Spoke to RN regarding findings.      Aspiration Risk  Severe aspiration risk;Risk for inadequate nutrition/hydration    Diet Recommendation   defer to md, npo except meds and MBS vs po with risks known  Medication Administration: Whole meds with puree    Other  Recommendations Oral Care Recommendations: Oral care QID Other Recommendations: Have oral suction available   Follow up Recommendations        Frequency and Duration      tbd      Prognosis Prognosis for Safe Diet Advancement: Guarded Barriers to Reach Goals: Cognitive deficits;Severity of deficits      Swallow Study   General Date of Onset: 04/03/16 HPI: Pt found down after multiple days.  Pt has degenerative cervical spine disease.  Chest x-ray features hazy perihilar opacities which could reflect CHF, aspiration, or pneumonia per MD note.  PMH + for dementia, anemia, diverticulosis.  US unremarkable except pleural effusion.  Pt was provided with 2 L/m supplemental oxygen and his O2 saturations  normalized in ED per notes.  Antibiotics started for suspected community-acquired pneumonia.  Pt admited for aspiration pneumonia, as well as rhabdomyolysis and failure to thrive per ED notes.   Type of Study: Bedside Swallow Evaluation Temperature Spikes Noted: No Respiratory Status: Nasal cannula (3 liters) Behavior/Cognition: Alert;Cooperative Oral Cavity Assessment:  Dry Oral Care Completed by SLP: No Oral Cavity - Dentition: Dentures, top;Dentures, bottom Vision: Functional for self-feeding Self-Feeding Abilities: Needs assist Patient Positioning: Other (comment) (elevated side lying) Baseline Vocal Quality: Normal Volitional Cough: Weak Volitional Swallow: Able to elicit    Oral/Motor/Sensory Function Overall Oral Motor/Sensory Function: Generalized oral weakness   Ice Chips Ice chips: Not tested   Thin Liquid Thin Liquid: Impaired Presentation: Cup;Spoon;Straw Pharyngeal  Phase Impairments: Multiple swallows;Throat Clearing - Immediate;Throat Clearing - Delayed;Cough - Delayed    Nectar Thick Nectar Thick Liquid: Impaired Presentation: Straw Pharyngeal Phase Impairments: Multiple swallows;Throat Clearing - Immediate;Throat Clearing - Delayed;Cough - Delayed   Honey Thick Honey Thick Liquid: Not tested   Puree Puree: Impaired Presentation: Spoon Pharyngeal Phase Impairments: Multiple swallows;Throat Clearing - Immediate;Throat Clearing - Delayed;Cough - Delayed Other Comments: pt complained of sensation of residuals in pharynx requiring liquids to clear   Solid   GO   Solid: Impaired Oral Phase Impairments: Reduced lingual movement/coordination;Impaired mastication Oral Phase Functional Implications: Prolonged oral transit Pharyngeal Phase Impairments: Multiple swallows;Throat Clearing - Immediate;Throat Clearing - Delayed;Cough - Delayed        Mills Koller, MS [facility] SLP [phone]   AddendumNoralee Space paged MD to obtain clarification of plan.  Will await orders or return call.  Thanks.

## 2016-04-04 ENCOUNTER — Encounter (HOSPITAL_COMMUNITY): Payer: Self-pay

## 2016-04-04 ENCOUNTER — Inpatient Hospital Stay (HOSPITAL_COMMUNITY): Payer: Medicare Other

## 2016-04-04 LAB — GLUCOSE, CAPILLARY: Glucose-Capillary: 99 mg/dL (ref 65–99)

## 2016-04-04 LAB — PROCALCITONIN: Procalcitonin: 1.06 ng/mL

## 2016-04-04 LAB — LEGIONELLA PNEUMOPHILA SEROGP 1 UR AG: L. PNEUMOPHILA SEROGP 1 UR AG: NEGATIVE

## 2016-04-04 MED ORDER — ENSURE ENLIVE PO LIQD
237.0000 mL | Freq: Two times a day (BID) | ORAL | Status: DC
  Administered 2016-04-05 – 2016-04-11 (×13): 237 mL via ORAL

## 2016-04-04 NOTE — Progress Notes (Signed)
PROGRESS NOTE    Connor Miller  ZOX:[id number] DOB: 1922-08-13 DOA: 04/02/2016 PCP: Sanda Lingerhomas Jones, MD   Brief Narrative:  82 y.o. male with medical history significant for hypertension, hypothyroidism, B12 deficiency with anemia, and dementia who presents to the emergency department after being found on the floor in his feces and urine. Pt presented with pneumonia likely aspiration, rhabdomyolysis, and elevated troponin with no reported chest pain.   Assessment & Plan:   Principal Problem:   CAP (community acquired pneumonia) - Pt currently on Unasyn will transition to oral regimen next am. - obtained speech therapy evaluation who is currently suspecting that patient is aspirating with all intake. - obtain MBS to further evaluate, reporting mild aspiration risk.  Active Problems:   Hypothyroidism - pt currently on synthroid    Essential hypertension, benign - normotensive off blood pressure medication    Failure to thrive in adult - up with assistance - obtain PT evaluation    Rhabdomyolysis - continue IVF rehydration most likely contributed to elevated troponin level  Elevated troponin - secondary most likely due to demand ischemia most likely from pneumonia and or rhabdomyolysis. No chest pain reported.    Decubitus ulcer - continue wound care consult    Dementia   CKD (chronic kidney disease), stage III - stable    Macrocytic anemia with vitamin B12 deficiency   Thrombocytopenia (HCC)   Abnormal LFTs   DVT prophylaxis: Heparin Code Status: Full Family Communication: None at bedside Disposition Plan: pending improvement in condition   Consultants:   none   Procedures: None   Antimicrobials: pt on unasyn   Subjective: Pt reports no new complaints. No acute issues overnight.  Objective: Vitals:   04/04/16 0500 04/04/16 0542 04/04/16 0945 04/04/16 1345  BP:  (!) 142/90 (!) 150/89 (!) 142/88  Pulse:  67 73 62  Resp:  14 20 20   Temp:  98 F  (36.7 C) 98.1 F (36.7 C) 98 F (36.7 C)  TempSrc:  Oral Oral Oral  SpO2:  98% 93% 97%  Weight: 58 kg (127 lb 13.9 oz)     Height:        Intake/Output Summary (Last 24 hours) at 04/04/16 1713 Last data filed at 04/04/16 1400  Gross per 24 hour  Intake             1617 ml  Output             1225 ml  Net              392 ml   Filed Weights   04/02/16 2007 04/03/16 0555 04/04/16 0500  Weight: 58 kg (127 lb 13.9 oz) 58.4 kg (128 lb 12 oz) 58 kg (127 lb 13.9 oz)    Examination:  General exam: Appears calm and comfortable, in nad. Respiratory system: poor inspiratory effort, rhales, no wheezes Cardiovascular system: S1 & S2 heard, RRR. No JVD, murmurs, rubs, gallops or clicks.  Gastrointestinal system: Abdomen is nondistended, soft and nontender. No organomegaly or masses felt. Normal bowel sounds heard. Central nervous system: moves extremities equally, no facial asymmetry Extremities: Symmetric 5 x 5 power. Skin: + ulcer sacrum 4 stage 2 pressure ulcers to scapulae and midthoracic spine Psychiatry: unable to accurately assess due to limited cooperation with examiner     Data Reviewed: I have personally reviewed following labs and imaging studies  CBC:  Recent Labs Lab 04/02/16 1640 04/03/16 0424  WBC 4.7 3.8*  NEUTROABS 3.5 2.9  HGB 12.3* 10.2*  HCT 36.0* 29.3*  MCV 103.2* 103.9*  PLT 113* 90*   Basic Metabolic Panel:  Recent Labs Lab 04/02/16 1640 04/03/16 0424  NA 140 143  K 4.3 3.7  CL 102 108  CO2 29 27  GLUCOSE 102* 84  BUN 57* 55*  CREATININE 1.34* 1.21  CALCIUM 9.2 8.3*   GFR: Estimated Creatinine Clearance: 32.6 mL/min (by C-G formula based on SCr of 1.21 mg/dL). Liver Function Tests:  Recent Labs Lab 04/02/16 1640 04/03/16 0424  AST 180* 121*  ALT 93* 75*  ALKPHOS 40 32*  BILITOT 1.8* 1.1  PROT 7.2 5.5*  ALBUMIN 4.2 3.2*   No results for input(s): LIPASE, AMYLASE in the last 168 hours. No results for input(s): AMMONIA in the last  168 hours. Coagulation Profile: No results for input(s): INR, PROTIME in the last 168 hours. Cardiac Enzymes:  Recent Labs Lab 04/02/16 1640 04/02/16 2031 04/03/16 0424 04/03/16 1027 04/03/16 1720  CKTOTAL 4,826*  --  2,900*  --   --   TROPONINI  --  0.10* 0.11* 0.09* 0.09*   BNP (last 3 results) No results for input(s): PROBNP in the last 8760 hours. HbA1C: No results for input(s): HGBA1C in the last 72 hours. CBG:  Recent Labs Lab 04/04/16 0747  GLUCAP 99   Lipid Profile: No results for input(s): CHOL, HDL, LDLCALC, TRIG, CHOLHDL, LDLDIRECT in the last 72 hours. Thyroid Function Tests:  Recent Labs  04/02/16 2048  TSH 33.990*  FREET4 0.55*   Anemia Panel:  Recent Labs  04/02/16 2048  VITAMINB12 316  FERRITIN 241  TIBC 228*  IRON 29*   Sepsis Labs:  Recent Labs Lab 04/02/16 1701 04/02/16 2048 04/03/16 0000 04/04/16 0553  PROCALCITON  --  2.53  --  1.06  LATICACIDVEN 2.34* 1.4 1.2  --     No results found for this or any previous visit (from the past 240 hour(s)).       Radiology Studies: Ct Head Wo Contrast  Result Date: 04/02/2016 CLINICAL DATA:  Unwitnessed fall. EXAM: CT HEAD WITHOUT CONTRAST CT CERVICAL SPINE WITHOUT CONTRAST TECHNIQUE: Multidetector CT imaging of the head and cervical spine was performed following the standard protocol without intravenous contrast. Multiplanar CT image reconstructions of the cervical spine were also generated. COMPARISON:  02/27/2015 head and cervical spine CT. FINDINGS: CT HEAD FINDINGS Brain: No evidence of parenchymal hemorrhage or extra-axial fluid collection. No mass lesion, mass effect, or midline shift. No CT evidence of acute infarction. Intracranial atherosclerosis. Nonspecific moderate subcortical and periventricular white matter hypodensity, most in keeping with chronic small vessel ischemic change. Generalized cerebral volume loss. Cerebral ventricle sizes are stable and concordant with the degree  of cerebral volume loss. Vascular: No hyperdense vessel or unexpected calcification. Skull: No evidence of calvarial fracture. Bilateral scleral banding. Sinuses/Orbits: The visualized paranasal sinuses are essentially clear. Other:  The mastoid air cells are unopacified. CT CERVICAL SPINE FINDINGS Alignment: Minimal 2 mm anterolisthesis at C4-5 is stable. Mild 3 mm anterolisthesis at C7-T1 is stable. No acute subluxation. Skull base and vertebrae: No acute fracture. No primary bone lesion or focal pathologic process. Soft tissues and spinal canal: No prevertebral fluid or swelling. No visible canal hematoma. Disc levels: Severe degenerative disc disease throughout the cervical spine, most prominent at C6-7, noting stable ankylosis at C3-4. Severe bilateral facet arthropathy. Moderate degenerative foraminal stenosis on the right at C2-3. Mild degenerative foraminal stenosis bilaterally at C3-4. Mild degenerative foraminal stenosis on the right at C4-5 and C5-6. Upper chest: Stable  pleural-parenchymal scarring at the right greater than left lung apices. Other: Stable calcified 1.0 cm nodule at the superior right side lobe. IMPRESSION: 1. No evidence of acute intracranial abnormality. No evidence of calvarial fracture . 2. Moderate chronic small vessel ischemia and generalized cerebral volume loss. 3. No fracture or acute malalignment in the cervical spine . 4. Severe degenerative changes in the cervical spine as described. Electronically Signed   By: Delbert Phenix M.D.   On: 04/02/2016 17:37   Ct Cervical Spine Wo Contrast  Result Date: 04/02/2016 CLINICAL DATA:  Unwitnessed fall. EXAM: CT HEAD WITHOUT CONTRAST CT CERVICAL SPINE WITHOUT CONTRAST TECHNIQUE: Multidetector CT imaging of the head and cervical spine was performed following the standard protocol without intravenous contrast. Multiplanar CT image reconstructions of the cervical spine were also generated. COMPARISON:  02/27/2015 head and cervical spine  CT. FINDINGS: CT HEAD FINDINGS Brain: No evidence of parenchymal hemorrhage or extra-axial fluid collection. No mass lesion, mass effect, or midline shift. No CT evidence of acute infarction. Intracranial atherosclerosis. Nonspecific moderate subcortical and periventricular white matter hypodensity, most in keeping with chronic small vessel ischemic change. Generalized cerebral volume loss. Cerebral ventricle sizes are stable and concordant with the degree of cerebral volume loss. Vascular: No hyperdense vessel or unexpected calcification. Skull: No evidence of calvarial fracture. Bilateral scleral banding. Sinuses/Orbits: The visualized paranasal sinuses are essentially clear. Other:  The mastoid air cells are unopacified. CT CERVICAL SPINE FINDINGS Alignment: Minimal 2 mm anterolisthesis at C4-5 is stable. Mild 3 mm anterolisthesis at C7-T1 is stable. No acute subluxation. Skull base and vertebrae: No acute fracture. No primary bone lesion or focal pathologic process. Soft tissues and spinal canal: No prevertebral fluid or swelling. No visible canal hematoma. Disc levels: Severe degenerative disc disease throughout the cervical spine, most prominent at C6-7, noting stable ankylosis at C3-4. Severe bilateral facet arthropathy. Moderate degenerative foraminal stenosis on the right at C2-3. Mild degenerative foraminal stenosis bilaterally at C3-4. Mild degenerative foraminal stenosis on the right at C4-5 and C5-6. Upper chest: Stable pleural-parenchymal scarring at the right greater than left lung apices. Other: Stable calcified 1.0 cm nodule at the superior right side lobe. IMPRESSION: 1. No evidence of acute intracranial abnormality. No evidence of calvarial fracture . 2. Moderate chronic small vessel ischemia and generalized cerebral volume loss. 3. No fracture or acute malalignment in the cervical spine . 4. Severe degenerative changes in the cervical spine as described. Electronically Signed   By: Delbert Phenix  M.D.   On: 04/02/2016 17:37   Dg Swallowing Func-speech Pathology  Result Date: 04/04/2016 Objective Swallowing Evaluation: Type of Study: MBS-Modified Barium Swallow Study Patient Details Name: Connor Miller MRN: [id number] Date of Birth: 1923-04-01 Today's Date: 04/04/2016 Time: SLP Start Time (ACUTE ONLY): 1101-SLP Stop Time (ACUTE ONLY): 1140 SLP Time Calculation (min) (ACUTE ONLY): 39 min Past Medical History: Past Medical History: Diagnosis Date . Anemia   NOS . BPH (benign prostatic hyperplasia)  . Colon polyps  . Dementia  . Diverticulosis  . Hypertension  . Hypothyroidism  Past Surgical History: Past Surgical History: Procedure Laterality Date . COLONOSCOPY    multiple . EYE SURGERY   . HEMORRHOID SURGERY  1972 . INGUINAL HERNIA REPAIR  04/1995 . VITRECTOMY  07/1990  for macular hole HPI: Pt found down after multiple days.  Pt has degenerative cervical spine disease.  Chest x-ray features hazy perihilar opacities which could reflect CHF, aspiration, or pneumonia per MD note.  PMH + for dementia, anemia, diverticulosis.  US unremarkable except pleural effusion.  Pt was provided with 2 L/m supplemental oxygen and his O2 saturations normalized in ED per notes.  Antibiotics started for suspected community-acquired pneumonia.  Pt admited for aspiration pneumonia, as well as rhabdomyolysis and failure to thrive per ED notes.   Subjective: pt alert Assessment / Plan / Recommendation CHL IP CLINICAL IMPRESSIONS 04/04/2016 Therapy Diagnosis Mild oral phase dysphagia;Suspected primary esophageal dysphagia Clinical Impression Pt with suprisingly only mild oropharyngeal dysphagia without aspiration and trace laryngeal penetration of thin cleared with reflexive throat clearing.  Oropharyngeal swallow was strong with only minimal residuals. Cued dry swallows helpful to decrease residuals.  Mr Musich did appear with delayed clearance of the distal esophagus of puree/tablet without sensation.  He required multiple boluses  of thin liquid to clear into stomach. Radiologist not present to confirm.   Suspect this may be primary area of difficulty/dysphagia.    Impact on safety and function Mild aspiration risk   No flowsheet data found.  Prognosis 04/04/2016 Prognosis for Safe Diet Advancement Fair Barriers to Reach Goals -- Barriers/Prognosis Comment -- CHL IP DIET RECOMMENDATION 04/04/2016 SLP Diet Recommendations Thin liquid Liquid Administration via Straw Medication Administration Crushed with puree Compensations Slow rate;Small sips/bites;Follow solids with liquid Postural Changes Seated upright at 90 degrees;Remain semi-upright after after feeds/meals (Comment)   CHL IP OTHER RECOMMENDATIONS 04/04/2016 Recommended Consults -- Oral Care Recommendations Oral care BID Other Recommendations Have oral suction available   CHL IP FOLLOW UP RECOMMENDATIONS 04/04/2016 Follow up Recommendations (No Data)   CHL IP FREQUENCY AND DURATION 04/04/2016 Speech Therapy Frequency (ACUTE ONLY) min 2x/week Treatment Duration 1 week      CHL IP ORAL PHASE 04/04/2016 Oral Phase Impaired Oral - Pudding Teaspoon -- Oral - Pudding Cup -- Oral - Honey Teaspoon -- Oral - Honey Cup -- Oral - Nectar Teaspoon -- Oral - Nectar Cup -- Oral - Nectar Straw Weak lingual manipulation;Premature spillage;Lingual/palatal residue Oral - Thin Teaspoon Weak lingual manipulation;Premature spillage;Lingual/palatal residue Oral - Thin Cup -- Oral - Thin Straw Weak lingual manipulation;Premature spillage Oral - Puree Weak lingual manipulation;Premature spillage Oral - Mech Soft Weak lingual manipulation;Premature spillage Oral - Regular -- Oral - Multi-Consistency -- Oral - Pill Weak lingual manipulation;Premature spillage Oral Phase - Comment trace oral residuals noted  CHL IP PHARYNGEAL PHASE 04/04/2016 Pharyngeal Phase Impaired Pharyngeal- Pudding Teaspoon -- Pharyngeal -- Pharyngeal- Pudding Cup -- Pharyngeal -- Pharyngeal- Honey Teaspoon -- Pharyngeal -- Pharyngeal- Honey  Cup -- Pharyngeal -- Pharyngeal- Nectar Teaspoon -- Pharyngeal -- Pharyngeal- Nectar Cup -- Pharyngeal -- Pharyngeal- Nectar Straw Pharyngeal residue - valleculae;Pharyngeal residue - pyriform Pharyngeal -- Pharyngeal- Thin Teaspoon Pharyngeal residue - valleculae;WFL;Pharyngeal residue - pyriform Pharyngeal -- Pharyngeal- Thin Cup NT;Pharyngeal residue - valleculae;Pharyngeal residue - pyriform Pharyngeal -- Pharyngeal- Thin Straw Penetration/Aspiration during swallow Pharyngeal Material enters airway, CONTACTS cords and then ejected out Pharyngeal- Puree WFL Pharyngeal -- Pharyngeal- Mechanical Soft WFL Pharyngeal -- Pharyngeal- Regular -- Pharyngeal -- Pharyngeal- Multi-consistency -- Pharyngeal -- Pharyngeal- Pill WFL Pharyngeal -- Pharyngeal Comment --  CHL IP CERVICAL ESOPHAGEAL PHASE 04/04/2016 Cervical Esophageal Phase WFL Pudding Teaspoon -- Pudding Cup -- Honey Teaspoon -- Honey Cup -- Nectar Teaspoon -- Nectar Cup -- Nectar Straw -- Thin Teaspoon -- Thin Cup -- Thin Straw -- Puree -- Mechanical Soft -- Regular -- Multi-consistency -- Pill -- Cervical Esophageal Comment -- No flowsheet data found. [name], MS [facility] SLP [phone]              US Abdomen Limited  Ruq  Result Date: 04/03/2016 CLINICAL DATA:  Abnormal LFTs EXAM: US ABDOMEN LIMITED - RIGHT UPPER QUADRANT COMPARISON:  10/16/2006 CT abdomen with contrast FINDINGS: Gallbladder: Limited visualization because of upper abdominal obscuring bowel gas. No definite gallstones, wall thickening or pericholecystic fluid. Wall thickness measures 2 mm. No Murphy's sign. Common bile duct: Diameter: 1.7 mm. Liver: Also limited because of overlying obscuring bowel gas. No definite focal abnormality or intrahepatic biliary dilatation. Patent hepatic and portal veins with normal directional flow. Small right effusion noted during the exam. IMPRESSION: Limited exam because of bowel gas but no definite acute finding by ultrasound. No biliary dilatation Small  right pleural effusion Electronically Signed   By: Judie Petit.  Shick M.D.   On: 04/03/2016 08:38        Scheduled Meds: . ampicillin-sulbactam (UNASYN) IV  3 g Intravenous Q8H  . chlorhexidine  15 mL Mouth/Throat BID  . collagenase   Topical Daily  . dorzolamide-timolol  1 drop Left Eye BID  . erythromycin   Both Eyes Q6H  . [START ON 04/05/2016] feeding supplement (ENSURE ENLIVE)  237 mL Oral BID BM  . heparin  5,000 Units Subcutaneous Q8H  . levothyroxine  75 mcg Oral QAC breakfast  . sodium chloride  1,000 mL Intravenous Once   Continuous Infusions: . dextrose 5 % and 0.45 % NaCl with KCl 20 mEq/L 100 mL/hr at 04/04/16 1555     LOS: 2 days   Time spent: > 35 minutes  Penny Pia, MD [facility] Pager [phone]  If 7PM-7AM, please contact night-coverage [email or web address] Password [email or web address] 04/04/2016, 5:13 PM

## 2016-04-04 NOTE — Evaluation (Signed)
Physical Therapy Evaluation Patient Details Name: Gwen R Barfield MRN: [id number] Derry SkillDOB: February 06, 1923 Today's Date: 04/04/2016   History of Present Illness  82 yo male admitted with Pna, fall, rhabdomyolysis. Hx of dementia, HTN, CKD  Clinical Impression  Bed level eval on today. Pt followed commands however he did not open his eyes at all during session. Bil UE and LE ROM exercises initiated. Will continue to follow and progress activity as tolerated.     Follow Up Recommendations SNF    Equipment Recommendations   (to be determined)    Recommendations for Other Services OT consult     Precautions / Restrictions Precautions Precautions: Fall Restrictions Weight Bearing Restrictions: No      Mobility  Bed Mobility Overal bed mobility: Needs Assistance             General bed mobility comments: Total assist to reposition and [facility]  Transfers                    Ambulation/Gait                Stairs            Wheelchair Mobility    Modified Rankin (Stroke Patients Only)       Balance                                             Pertinent Vitals/Pain Pain Assessment: No/denies pain    Home Living Family/patient expects to be discharged to:: Skilled nursing facility Living Arrangements: Spouse/significant other                    Prior Function           Comments: pt reports he was ambulatory without an assistive device at baseline     Hand Dominance        Extremity/Trunk Assessment   Upper Extremity Assessment: Generalized weakness           Lower Extremity Assessment: Generalized weakness         Communication   Communication: No difficulties  Cognition Arousal/Alertness: Awake/alert Behavior During Therapy: WFL for tasks assessed/performed Overall Cognitive Status: History of cognitive impairments - at baseline       Memory: Decreased short-term memory               General Comments      Exercises General Exercises - Upper Extremity Shoulder Flexion: AROM;AAROM;10 reps;Supine Elbow Flexion: AROM;AAROM;Both;10 reps;Supine General Exercises - Lower Extremity Heel Slides: AROM;AAROM;Both;10 reps;Supine Hip ABduction/ADduction: AROM;AAROM;Both;10 reps;Supine   Assessment/Plan    PT Assessment Patient needs continued PT services  PT Problem List Decreased mobility;Decreased strength;Decreased activity tolerance;Decreased balance;Decreased cognition;Decreased knowledge of use of DME          PT Treatment Interventions DME instruction;Therapeutic activities;Therapeutic exercise;Gait training;Functional mobility training;Balance training;Patient/family education    PT Goals (Current goals can be found in the Care Plan section)  Acute Rehab PT Goals Patient Stated Goal: none stated PT Goal Formulation: With patient Time For Goal Achievement: 04/18/16 Potential to Achieve Goals: Fair    Frequency Min 3X/week   Barriers to discharge        Co-evaluation               End of Session   Activity Tolerance: Patient tolerated treatment well Patient left: in bed;with  call bell/phone within reach;with bed alarm set           Time: 4098-1191 PT Time Calculation (min) (ACUTE ONLY): 13 min   Charges:   PT Evaluation $PT Eval Low Complexity: 1 Procedure     PT G Codes:        Rebeca Alert, MPT Pager: [phone]

## 2016-04-04 NOTE — NC FL2 (Signed)
[place] MEDICAID FL2 LEVEL OF CARE SCREENING TOOL     IDENTIFICATION  Patient Name: Connor Miller Newborn Birthdate: Apr 30, 1923 Sex: male Admission Date (Current Location): 04/02/2016  [facility] [place]   Facility and Address:  [facility],  [address], [place] [id number]      Provider Number: [id number]  Attending Physician Name and Address:  Penny Piarlando Vega, MD  Relative Name and Phone Number:       Current Level of Care: Hospital Recommended Level of Care: Skilled Nursing Facility Prior Approval Number:    Date Approved/Denied:   PASRR Number:    Discharge Plan: SNF    Current Diagnoses: Patient Active Problem List   Diagnosis Date Noted  . Rhabdomyolysis 04/02/2016  . Hypoxia 04/02/2016  . Decubitus ulcer 04/02/2016  . Dementia 04/02/2016  . CKD (chronic kidney disease), stage III 04/02/2016  . Macrocytic anemia with vitamin B12 deficiency 04/02/2016  . Thrombocytopenia (HCC) 04/02/2016  . Acute respiratory failure with hypoxia (HCC) 04/02/2016  . Pneumonia 04/02/2016  . Abnormal LFTs 04/02/2016  . AKI (acute kidney injury) (HCC)   . Aspiration pneumonia (HCC)   . Non-traumatic rhabdomyolysis   . Absolute anemia   . Post-traumatic wound infection 03/20/2015  . Lumbago 02/08/2015  . Hip pain, bilateral 02/08/2015  . Failure to thrive in adult 02/14/2014  . Complete rotator cuff tear of left shoulder 03/31/2013  . Atherosclerosis of native arteries of the extremities with intermittent claudication 01/20/2013  . Dermatitis, atopic 08/05/2012  . OAB (overactive bladder) 03/24/2012  . Constipation 06/18/2011  . CAP (community acquired pneumonia) 05/28/2011  . BPH (benign prostatic hyperplasia) 05/07/2011  . Hypothyroidism 08/05/2010  . Essential hypertension, benign 08/05/2010    Orientation RESPIRATION BLADDER Height & Weight     Self  O2 (2L)  incontinent Weight: 127 lb 13.9 oz (58 kg) Height:  5\' 10"  (177.8 cm)   BEHAVIORAL SYMPTOMS/MOOD NEUROLOGICAL BOWEL NUTRITION STATUS      incontinent Diet (Dysphagia 3)  AMBULATORY STATUS COMMUNICATION OF NEEDS Skin   Extensive Assist Verbally Pressure Ulcer a red pink wound w/o slough bilateral scapula                       Personal Care Assistance Level of Assistance  Bathing, Feeding, Dressing Bathing Assistance: Limited assistance Feeding assistance: Limited assistance Dressing Assistance: Limited assistance     Functional Limitations Info  Sight, Hearing, Speech Sight Info: Adequate Hearing Info: Adequate Speech Info: Adequate    SPECIAL CARE FACTORS FREQUENCY  Speech therapy  Physical therapy  Therapy     PT Frequency: 5    OT Frequency:5   Speech Therapy Frequency: TBD      Contractures Contractures Info: Not present    Additional Factors Info  Code Status, Allergies Code Status Info: Fullcode Allergies Info: No Known Allergies           Current Medications (04/04/2016):  This is the current hospital active medication list Current Facility-Administered Medications  Medication Dose Route Frequency Provider Last Rate Last Dose  . acetaminophen (TYLENOL) tablet 650 mg  650 mg Oral Q6H PRN Briscoe Deutscherimothy S Opyd, MD       Or  . acetaminophen (TYLENOL) suppository 650 mg  650 mg Rectal Q6H PRN Lavone Neriimothy S Opyd, MD      . Ampicillin-Sulbactam (UNASYN) 3 g in sodium chloride 0.9 % 100 mL IVPB  3 g Intravenous Q8H Rollene Farerin Miller Williamson, RPH   3 g at 04/04/16  1038  . chlorhexidine (PERIDEX) 0.12 % solution 15 mL  15 mL Mouth/Throat BID Penny Pia, MD   15 mL at 04/04/16 1147  . collagenase (SANTYL) ointment   Topical Daily Penny Pia, MD      . dextrose 5 % and 0.45 % NaCl with KCl 20 mEq/L infusion   Intravenous Continuous Penny Pia, MD 100 mL/hr at 04/04/16 1555    . dorzolamide-timolol (COSOPT) 22.3-6.8 MG/ML ophthalmic solution 1 drop  1 drop Left Eye BID Briscoe Deutscher, MD   1 drop at 04/04/16 1147  . erythromycin ophthalmic  ointment   Both Eyes Q6H Lavone Neri Opyd, MD      . Melene Muller ON 04/05/2016] feeding supplement (ENSURE ENLIVE) (ENSURE ENLIVE) liquid 237 mL  237 mL Oral BID BM Penny Pia, MD      . heparin injection 5,000 Units  5,000 Units Subcutaneous Q8H Briscoe Deutscher, MD   5,000 Units at 04/04/16 1350  . ipratropium-albuterol (DUONEB) 0.5-2.5 (3) MG/3ML nebulizer solution 3 mL  3 mL Nebulization Q4H PRN Lavone Neri Opyd, MD      . levothyroxine (SYNTHROID, LEVOTHROID) tablet 75 mcg  75 mcg Oral QAC breakfast Briscoe Deutscher, MD   75 mcg at 04/03/16 0800  . morphine 2 MG/ML injection 1 mg  1 mg Intravenous Q3H PRN Briscoe Deutscher, MD      . ondansetron (ZOFRAN) tablet 4 mg  4 mg Oral Q6H PRN Briscoe Deutscher, MD       Or  . ondansetron (ZOFRAN) injection 4 mg  4 mg Intravenous Q6H PRN Lavone Neri Opyd, MD      . sodium chloride 0.9 % bolus 1,000 mL  1,000 mL Intravenous Once Laurence Spates, MD         Discharge Medications: Please see discharge summary for a list of discharge medications.  Relevant Imaging Results:  Relevant Lab Results:   Additional Information [id number]  Clearance Coots, LCSW

## 2016-04-04 NOTE — Progress Notes (Signed)
Initial Nutrition Assessment  DOCUMENTATION CODES:   Severe malnutrition in context of chronic illness, Underweight  INTERVENTION:  Ensure Enlive po BID, each supplement provides 350 kcal and 20 grams of protein.  Monitor magnesium, potassium, and phosphorus daily for at least 3 days, MD to replete as needed, as pt is at risk for refeeding syndrome given severe chronic malnutrition, underweight BMI.  RD will continue to follow patient and assess intake.   NUTRITION DIAGNOSIS:   Malnutrition (Severe) related to chronic illness as evidenced by severe depletion of muscle mass, severe depletion of body fat.  GOAL:   Patient will meet greater than or equal to 90% of their needs  MONITOR:   PO intake, Supplement acceptance, Labs, I & O's, Weight trends  REASON FOR ASSESSMENT:   Malnutrition Screening Tool    ASSESSMENT:   82 y.o.malewith medical history significant for hypertension, hypothyroidism, B12 deficiency with anemia, and dementia who presents to the emergency department after being found on the floor. Pt presented with pneumonia likely aspiration, rhabdomyolysis, and elevated troponin with no reported chest pain.   -SLP evaluation completed 10/26 concerning that pt may be aspirating with all intake. -MBS completed today (10/27) found pt only with mild oropharyngeal dysphagia without aspiration - recommended Full Liquid Diet with strict precautions.  Patient noted to have dementia, but was able to answer several questions for RD. There was no family at bedside. He reports that he has a good appetite and eats 100% of three meals daily. Patient says he enjoys protein foods such as roast beef or chopped meats. Will still consider patient at risk for refeeding as cannot be sure he is reporting this accurately. He reports that his wife drinks Ensure and that he is willing to try it. He reported UBW of 175 lbs and that he has not lost any weight. This is likely inaccurate. Per  chart he has been in the range of 127-132 lbs since 2015.   Medications reviewed and include: levothyroxine, D5-1/2NS with KCl 20 mEq/L @ 100 ml/hr (120 grams dextrose, 408 kcal daily).  Labs reviewed: CBG 99.  Nutrition-Focused physical exam completed. Findings are severe fat depletion, severe muscle depletion. Unable to assess edema.   Diet Order:  Diet full liquid Room service appropriate? Yes; Fluid consistency: Thin  Skin:  Wound (see comment) (4 Stage II to scapulae & mid-thoracic spine)  Last BM:  04/02/2016  Height:   Ht Readings from Last 1 Encounters:  04/02/16 5\' 10"  (1.778 m)    Weight:   Wt Readings from Last 1 Encounters:  04/04/16 127 lb 13.9 oz (58 kg)    Ideal Body Weight:  75.45 kg  BMI:  Body mass index is 18.35 kg/m.  Estimated Nutritional Needs:   Kcal:  1400-1600 (MSJ x 1.2-1.3)  Protein:  70-87 grams (1.2-1.5 grams/kg)  Fluid:  1.4-1.6 L/day  EDUCATION NEEDS:   Education needs no appropriate at this time  Helane RimaLeanne Carlosdaniel Grob, MS, RD, LDN Pager: [phone] After Hours Pager: [phone]

## 2016-04-04 NOTE — Progress Notes (Signed)
MBS completed, full report to follow.  Pt with suprisingly only mild oropharyngeal dysphagia without aspiration and trace laryngeal penetration of thin cleared with reflexive throat clearing.  Oropharyngeal swallow was strong with only minimal residuals.    [name] did appear with delayed clearance of the distal esophagus of puree/tablet without sensation.  He required multiple boluses of thin liquid to clear into stomach.  Radiologist not present to confirm.   Suspect this may be primary area of difficulty/dysphagia.    Using live video, educated pt to findings/recommendations.  Recommend full liquids with strict precautions for now.  Will follow up for dietary tolerance and advancement.   Donavan Burnetamara Shamel Galyean, MS [facility] SLP    [phone]

## 2016-04-05 LAB — GLUCOSE, CAPILLARY: Glucose-Capillary: 108 mg/dL — ABNORMAL HIGH (ref 65–99)

## 2016-04-05 MED ORDER — SODIUM CHLORIDE 0.9 % IV SOLN
INTRAVENOUS | Status: DC
  Administered 2016-04-05 – 2016-04-08 (×3): via INTRAVENOUS

## 2016-04-05 NOTE — Progress Notes (Signed)
Palliative care consult received on admission. Patient does not have capacity for decision making. Discussed case with CSW in detail. Issues now are guardianship and placement. Will need facility level care. Recommend palliative care referral at SNF. Palliative will sign off.  Anderson MaltaElizabeth Golding, DO Palliative Medicine

## 2016-04-05 NOTE — Progress Notes (Signed)
PROGRESS NOTE    Connor Miller  ZOX:[id number] DOB: July 06, 1922 DOA: 04/02/2016 PCP: Sanda Linger, MD   Brief Narrative:  82 y.o. male with medical history significant for hypertension, hypothyroidism, B12 deficiency with anemia, and dementia who presents to the emergency department after being found on the floor in his feces and urine. Pt presented with pneumonia likely aspiration, rhabdomyolysis, and elevated troponin with no reported chest pain.   Assessment & Plan:   Principal Problem:   CAP (community acquired pneumonia) - Patient still on Unasyn. Does not feel much improvement in his respiratory condition. Will continue to monitor in house - obtained speech therapy evaluation who is currently suspecting that patient is aspirating with all intake. - obtain MBS to further evaluate, reporting mild aspiration risk.  Active Problems:   Hypothyroidism - pt currently on synthroid    Essential hypertension, benign - normotensive off blood pressure medication    Failure to thrive in adult - up with assistance - obtain PT evaluation    Rhabdomyolysis - obtain CK next am. - continue IVF's  Elevated troponin - secondary most likely due to demand ischemia most likely from pneumonia and or rhabdomyolysis. No chest pain reported.    Decubitus ulcer - continue wound care consult    Dementia   CKD (chronic kidney disease), stage III - stable    Macrocytic anemia with vitamin B12 deficiency   Thrombocytopenia (HCC)   Abnormal LFTs   DVT prophylaxis: Heparin Code Status: Full Family Communication: None at bedside Disposition Plan: pending improvement in condition   Consultants:   none   Procedures: None   Antimicrobials: pt on unasyn   Subjective: Pt reports no new complaints. States he feels about the same today  Objective: Vitals:   04/04/16 1345 04/04/16 2103 04/05/16 0440 04/05/16 1341  BP: (!) 142/88 116/75 125/76 118/73  Pulse: 62 72 70 61  Resp: 20  19 18 18   Temp: 98 F (36.7 C) 98.4 F (36.9 C) 98.3 F (36.8 C) 98 F (36.7 C)  TempSrc: Oral Oral Oral Oral  SpO2: 97% 94% 97% 97%  Weight:      Height:        Intake/Output Summary (Last 24 hours) at 04/05/16 1608 Last data filed at 04/05/16 1249  Gross per 24 hour  Intake              120 ml  Output              300 ml  Net             -180 ml   Filed Weights   04/02/16 2007 04/03/16 0555 04/04/16 0500  Weight: 58 kg (127 lb 13.9 oz) 58.4 kg (128 lb 12 oz) 58 kg (127 lb 13.9 oz)    Examination:  General exam: Appears calm and comfortable, in nad. Respiratory system: poor inspiratory effort, rhales, no wheezes Cardiovascular system: S1 & S2 heard, RRR. No JVD, murmurs, rubs, gallops or clicks.  Gastrointestinal system: Abdomen is nondistended, soft and nontender. No organomegaly or masses felt. Normal bowel sounds heard. Central nervous system: moves extremities equally, no facial asymmetry Extremities: Symmetric 5 x 5 power. Skin: + ulcer sacrum 4 stage 2 pressure ulcers to scapulae and midthoracic spine Psychiatry: unable to accurately assess due to limited cooperation with examiner     Data Reviewed: I have personally reviewed following labs and imaging studies  CBC:  Recent Labs Lab 04/02/16 1640 04/03/16 0424  WBC 4.7 3.8*  NEUTROABS 3.5  2.9  HGB 12.3* 10.2*  HCT 36.0* 29.3*  MCV 103.2* 103.9*  PLT 113* 90*   Basic Metabolic Panel:  Recent Labs Lab 04/02/16 1640 04/03/16 0424  NA 140 143  K 4.3 3.7  CL 102 108  CO2 29 27  GLUCOSE 102* 84  BUN 57* 55*  CREATININE 1.34* 1.21  CALCIUM 9.2 8.3*   GFR: Estimated Creatinine Clearance: 32.6 mL/min (by C-G formula based on SCr of 1.21 mg/dL). Liver Function Tests:  Recent Labs Lab 04/02/16 1640 04/03/16 0424  AST 180* 121*  ALT 93* 75*  ALKPHOS 40 32*  BILITOT 1.8* 1.1  PROT 7.2 5.5*  ALBUMIN 4.2 3.2*   No results for input(s): LIPASE, AMYLASE in the last 168 hours. No results for  input(s): AMMONIA in the last 168 hours. Coagulation Profile: No results for input(s): INR, PROTIME in the last 168 hours. Cardiac Enzymes:  Recent Labs Lab 04/02/16 1640 04/02/16 2031 04/03/16 0424 04/03/16 1027 04/03/16 1720  CKTOTAL 4,826*  --  2,900*  --   --   TROPONINI  --  0.10* 0.11* 0.09* 0.09*   BNP (last 3 results) No results for input(s): PROBNP in the last 8760 hours. HbA1C: No results for input(s): HGBA1C in the last 72 hours. CBG:  Recent Labs Lab 04/04/16 0747 04/05/16 0735  GLUCAP 99 108*   Lipid Profile: No results for input(s): CHOL, HDL, LDLCALC, TRIG, CHOLHDL, LDLDIRECT in the last 72 hours. Thyroid Function Tests:  Recent Labs  04/02/16 2048  TSH 33.990*  FREET4 0.55*   Anemia Panel:  Recent Labs  04/02/16 2048  VITAMINB12 316  FERRITIN 241  TIBC 228*  IRON 29*   Sepsis Labs:  Recent Labs Lab 04/02/16 1701 04/02/16 2048 04/03/16 0000 04/04/16 0553  PROCALCITON  --  2.53  --  1.06  LATICACIDVEN 2.34* 1.4 1.2  --     No results found for this or any previous visit (from the past 240 hour(s)).       Radiology Studies: Dg Swallowing Func-speech Pathology  Result Date: 04/04/2016 Objective Swallowing Evaluation: Type of Study: MBS-Modified Barium Swallow Study Patient Details Name: Connor Miller MRN: [id number] Date of Birth: January 28, 1923 Today's Date: 04/04/2016 Time: SLP Start Time (ACUTE ONLY): 1101-SLP Stop Time (ACUTE ONLY): 1140 SLP Time Calculation (min) (ACUTE ONLY): 39 min Past Medical History: Past Medical History: Diagnosis Date . Anemia   NOS . BPH (benign prostatic hyperplasia)  . Colon polyps  . Dementia  . Diverticulosis  . Hypertension  . Hypothyroidism  Past Surgical History: Past Surgical History: Procedure Laterality Date . COLONOSCOPY    multiple . EYE SURGERY   . HEMORRHOID SURGERY  1972 . INGUINAL HERNIA REPAIR  04/1995 . VITRECTOMY  07/1990  for macular hole HPI: Pt found down after multiple days.  Pt has  degenerative cervical spine disease.  Chest x-ray features hazy perihilar opacities which could reflect CHF, aspiration, or pneumonia per MD note.  PMH + for dementia, anemia, diverticulosis.  US unremarkable except pleural effusion.  Pt was provided with 2 L/m supplemental oxygen and his O2 saturations normalized in ED per notes.  Antibiotics started for suspected community-acquired pneumonia.  Pt admited for aspiration pneumonia, as well as rhabdomyolysis and failure to thrive per ED notes.   Subjective: pt alert Assessment / Plan / Recommendation CHL IP CLINICAL IMPRESSIONS 04/04/2016 Therapy Diagnosis Mild oral phase dysphagia;Suspected primary esophageal dysphagia Clinical Impression Pt with suprisingly only mild oropharyngeal dysphagia without aspiration and trace laryngeal penetration of thin cleared  with reflexive throat clearing.  Oropharyngeal swallow was strong with only minimal residuals. Cued dry swallows helpful to decrease residuals.  Connor Miller did appear with delayed clearance of the distal esophagus of puree/tablet without sensation.  He required multiple boluses of thin liquid to clear into stomach. Radiologist not present to confirm.   Suspect this may be primary area of difficulty/dysphagia.    Impact on safety and function Mild aspiration risk   No flowsheet data found.  Prognosis 04/04/2016 Prognosis for Safe Diet Advancement Fair Barriers to Reach Goals -- Barriers/Prognosis Comment -- CHL IP DIET RECOMMENDATION 04/04/2016 SLP Diet Recommendations Thin liquid Liquid Administration via Straw Medication Administration Crushed with puree Compensations Slow rate;Small sips/bites;Follow solids with liquid Postural Changes Seated upright at 90 degrees;Remain semi-upright after after feeds/meals (Comment)   CHL IP OTHER RECOMMENDATIONS 04/04/2016 Recommended Consults -- Oral Care Recommendations Oral care BID Other Recommendations Have oral suction available   CHL IP FOLLOW UP RECOMMENDATIONS  04/04/2016 Follow up Recommendations (No Data)   CHL IP FREQUENCY AND DURATION 04/04/2016 Speech Therapy Frequency (ACUTE ONLY) min 2x/week Treatment Duration 1 week      CHL IP ORAL PHASE 04/04/2016 Oral Phase Impaired Oral - Pudding Teaspoon -- Oral - Pudding Cup -- Oral - Honey Teaspoon -- Oral - Honey Cup -- Oral - Nectar Teaspoon -- Oral - Nectar Cup -- Oral - Nectar Straw Weak lingual manipulation;Premature spillage;Lingual/palatal residue Oral - Thin Teaspoon Weak lingual manipulation;Premature spillage;Lingual/palatal residue Oral - Thin Cup -- Oral - Thin Straw Weak lingual manipulation;Premature spillage Oral - Puree Weak lingual manipulation;Premature spillage Oral - Mech Soft Weak lingual manipulation;Premature spillage Oral - Regular -- Oral - Multi-Consistency -- Oral - Pill Weak lingual manipulation;Premature spillage Oral Phase - Comment trace oral residuals noted  CHL IP PHARYNGEAL PHASE 04/04/2016 Pharyngeal Phase Impaired Pharyngeal- Pudding Teaspoon -- Pharyngeal -- Pharyngeal- Pudding Cup -- Pharyngeal -- Pharyngeal- Honey Teaspoon -- Pharyngeal -- Pharyngeal- Honey Cup -- Pharyngeal -- Pharyngeal- Nectar Teaspoon -- Pharyngeal -- Pharyngeal- Nectar Cup -- Pharyngeal -- Pharyngeal- Nectar Straw Pharyngeal residue - valleculae;Pharyngeal residue - pyriform Pharyngeal -- Pharyngeal- Thin Teaspoon Pharyngeal residue - valleculae;WFL;Pharyngeal residue - pyriform Pharyngeal -- Pharyngeal- Thin Cup NT;Pharyngeal residue - valleculae;Pharyngeal residue - pyriform Pharyngeal -- Pharyngeal- Thin Straw Penetration/Aspiration during swallow Pharyngeal Material enters airway, CONTACTS cords and then ejected out Pharyngeal- Puree WFL Pharyngeal -- Pharyngeal- Mechanical Soft WFL Pharyngeal -- Pharyngeal- Regular -- Pharyngeal -- Pharyngeal- Multi-consistency -- Pharyngeal -- Pharyngeal- Pill WFL Pharyngeal -- Pharyngeal Comment --  CHL IP CERVICAL ESOPHAGEAL PHASE 04/04/2016 Cervical Esophageal Phase WFL  Pudding Teaspoon -- Pudding Cup -- Honey Teaspoon -- Honey Cup -- Nectar Teaspoon -- Nectar Cup -- Nectar Straw -- Thin Teaspoon -- Thin Cup -- Thin Straw -- Puree -- Mechanical Soft -- Regular -- Multi-consistency -- Pill -- Cervical Esophageal Comment -- No flowsheet data found. [name], MS [facility] SLP [phone]                   Scheduled Meds: . ampicillin-sulbactam (UNASYN) IV  3 g Intravenous Q8H  . chlorhexidine  15 mL Mouth/Throat BID  . collagenase   Topical Daily  . dorzolamide-timolol  1 drop Left Eye BID  . erythromycin   Both Eyes Q6H  . feeding supplement (ENSURE ENLIVE)  237 mL Oral BID BM  . heparin  5,000 Units Subcutaneous Q8H  . levothyroxine  75 mcg Oral QAC breakfast  . sodium chloride  1,000 mL Intravenous Once   Continuous Infusions: . dextrose  5 % and 0.45 % NaCl with KCl 20 mEq/L 100 mL/hr at 04/05/16 1604     LOS: 3 days   Time spent: > 35 minutes  Penny PiaVEGA, Khloi Rawl, MD [facility] Pager [phone]  If 7PM-7AM, please contact night-coverage [email or web address] Password [email or web address] 04/05/2016, 4:08 PM

## 2016-04-05 NOTE — Progress Notes (Signed)
Speech Language Pathology Treatment: Dysphagia  Patient Details Name: Connor Miller MRN: [id number] DOB: 03/11/1923 Today's Date: 04/05/2016 Time: 1430-1440 SLP Time Calculation (min) (ACUTE ONLY): 10 min  Assessment / Plan / Recommendation Clinical Impression  Pt observed eating his lunch, feeding himself.  Pt did not recall MBS from yesterday and benefited from moderate cues for use of compensation strategies to facilitate esophageal clearance. His cognitive deficits impair ability to follow said strategies.    Swallow was overall timely with no coughing or indications of severe deficits.  Chronic throat clearing noted during meal - which was observed clinically previously and during MBS - not corelated to aspiration.  Advised RN of findings clinically and on MBS - to prevent alarm with pt symptoms.    Overt throat clear x1 noted with trace penetration on MBS that did clear independently.   As pt is afebrile and lungs are clear - diet tolerance suspected - recommend continue full liquid diet with precautions.  Hopeful for continued advancement as pt medical status improves.       HPI HPI: Pt found down after multiple days.  Pt has degenerative cervical spine disease.  Chest x-ray features hazy perihilar opacities which could reflect CHF, aspiration, or pneumonia per MD note.  PMH + for dementia, anemia, diverticulosis.  US unremarkable except pleural effusion.  Pt was provided with 2 L/m supplemental oxygen and his O2 saturations normalized in ED per notes.  Antibiotics started for suspected community-acquired pneumonia.  Pt admited for aspiration pneumonia, as well as rhabdomyolysis and failure to thrive per ED notes.        SLP Plan  Continue with current plan of care     Recommendations  Diet recommendations: Thin liquid;Nectar-thick liquid (full liquids) Liquids provided via: Cup;Straw Medication Administration: Whole meds with puree (crushed if large and not  contraindicated) Supervision: Patient able to self feed (set up assist) Compensations: Slow rate;Small sips/bites;Follow solids with liquid Postural Changes and/or Swallow Maneuvers: Seated upright 90 degrees;Upright 30-60 min after meal                Oral Care Recommendations: Oral care BID Follow up Recommendations:  (tbd) Plan: Continue with current plan of care       GO                Connor Burnetamara Connor Cottam, MS [facility] SLP [phone]

## 2016-04-06 LAB — CBC
HCT: 27.3 % — ABNORMAL LOW (ref 39.0–52.0)
HEMOGLOBIN: 9.3 g/dL — AB (ref 13.0–17.0)
MCH: 35.1 pg — ABNORMAL HIGH (ref 26.0–34.0)
MCHC: 34.1 g/dL (ref 30.0–36.0)
MCV: 103 fL — ABNORMAL HIGH (ref 78.0–100.0)
Platelets: 120 10*3/uL — ABNORMAL LOW (ref 150–400)
RBC: 2.65 MIL/uL — ABNORMAL LOW (ref 4.22–5.81)
RDW: 14.1 % (ref 11.5–15.5)
WBC: 6.1 10*3/uL (ref 4.0–10.5)

## 2016-04-06 LAB — BASIC METABOLIC PANEL
Anion gap: 6 (ref 5–15)
BUN: 27 mg/dL — AB (ref 4–21)
BUN: 27 mg/dL — ABNORMAL HIGH (ref 6–20)
CALCIUM: 7.8 mg/dL — AB (ref 8.9–10.3)
CHLORIDE: 109 mmol/L (ref 101–111)
CO2: 23 mmol/L (ref 22–32)
CREATININE: 1 mg/dL (ref 0.6–1.3)
CREATININE: 0.99 mg/dL (ref 0.61–1.24)
GFR calc non Af Amer: >60 mL/min (ref >60)
Glucose, Bld: 91 mg/dL (ref 65–99)
Glucose: 91 mg/dL
Potassium: 4.6 mmol/L (ref 3.4–5.3)
Potassium: 4.6 mmol/L (ref 3.5–5.1)
SODIUM: 138 mmol/L (ref 135–145)
Sodium: 138 mmol/L (ref 137–147)

## 2016-04-06 LAB — CBC AND DIFFERENTIAL
HEMATOCRIT: 27 % — AB (ref 41–53)
Hemoglobin: 9.3 g/dL — AB (ref 13.5–17.5)
PLATELETS: 120 10*3/uL — AB (ref 150–399)
WBC: 6.1 10*3/mL

## 2016-04-06 LAB — GLUCOSE, CAPILLARY: Glucose-Capillary: 90 mg/dL (ref 65–99)

## 2016-04-06 LAB — PROCALCITONIN: PROCALCITONIN: 0.43 ng/mL

## 2016-04-06 LAB — CK: CK TOTAL: 574 U/L — AB (ref 49–397)

## 2016-04-06 MED ORDER — SODIUM CHLORIDE 0.9 % IV SOLN
3.0000 g | Freq: Four times a day (QID) | INTRAVENOUS | Status: DC
  Administered 2016-04-06 – 2016-04-08 (×7): 3 g via INTRAVENOUS
  Filled 2016-04-06 (×8): qty 3

## 2016-04-06 NOTE — Progress Notes (Addendum)
CSW called concerning pt SNF placement- CSW faxed referral out to preferred area and left message for admissions at [facility] (pref[facility] facility)- [facility]- will ask weekday staff to follow up regarding potential offer.  PASAR submitted and received: [id number]  CSW will continue to follow  Burna SisJenna H. Domique Reardon MSW, LCSW [facility] #: [phone]

## 2016-04-06 NOTE — Progress Notes (Signed)
Contacted Social Work, Eileen StanfordJenna [phone] per physician order to start process of patient discharge to SNF.  Spoke with Belgium[place]enna and she reports she will start the process today.

## 2016-04-06 NOTE — Progress Notes (Signed)
Pharmacy Antibiotic Note  Connor Miller is a 82 y.o. [facility] after unwitnessed fall and down x 2 days.  CXR concerning for possible aspiration pneumonia.  Pharmacy has been consulted for Unasyn dosing.  CrCl noted <30 ml/min on admission.  NKDA.  Ceftriaxone x1 given in ED. SCr has slightly improved today.  Plan:  Increase Unasyn 3g IV to q6h since CrCl now > 50 ml/min and patient not feeling much improved  F/u renal function, cultures, clinical course  Height: 5\' 10"  (177.8 cm) Weight: 168 lb 6.9 oz (76.4 kg) IBW/kg (Calculated) : 73  Temp (24hrs), Avg:98.6 F (37 C), Min:98 F (36.7 C), Max:99 F (37.2 C)   Recent Labs Lab 04/02/16 1640 04/02/16 1701 04/02/16 2048 04/03/16 0000 04/03/16 0424 04/06/16 0533  WBC 4.7  --   --   --  3.8* 6.1  CREATININE 1.34*  --   --   --  1.21 0.99  LATICACIDVEN  --  2.34* 1.4 1.2  --   --     Estimated Creatinine Clearance: 50.2 mL/min (by C-G formula based on SCr of 0.99 mg/dL).    No Known Allergies  Antimicrobials this admission: 10/25 Ceftriaxone x1 10/25 Unasyn >>    Dose adjustments this admission: 10/26 increase Unasyn from 3g q12h to q8h for CrCl > 30   Microbiology results: 10/25 Urine strep Ag: neg 10/25 Urine legionella Ag: neg  Thank you for allowing pharmacy to be a part of this patient's care.  Bernadene Personrew Keena Heesch, PharmD, BCPS Pager: [phone] 04/06/2016, 1:12 PM

## 2016-04-06 NOTE — Progress Notes (Signed)
PROGRESS NOTE    Connor Miller  ZOX:[id number] DOB: 1923-03-25 DOA: 04/02/2016 PCP: Sanda Lingerhomas Jones, MD   Brief Narrative:  82 y.o. male with medical history significant for hypertension, hypothyroidism, B12 deficiency with anemia, and dementia who presents to the emergency department after being found on the floor in his feces and urine. Pt presented with pneumonia likely aspiration, rhabdomyolysis, and elevated troponin with no reported chest pain.   Assessment & Plan:   Principal Problem:   CAP (community acquired pneumonia) - Patient still on Unasyn. Does not feel much improvement in his respiratory condition. Will continue to monitor in house - obtained speech therapy evaluation who is currently suspecting that patient is aspirating with all intake. - obtain MBS to further evaluate, reporting mild aspiration risk.  Active Problems:   Hypothyroidism - pt currently on synthroid    Essential hypertension, benign - normotensive off blood pressure medication    Failure to thrive in adult - up with assistance - obtain PT evaluation - consulted social worker    Rhabdomyolysis - obtain CK next am. - continue IVF's  Elevated troponin - secondary most likely due to demand ischemia most likely from pneumonia and or rhabdomyolysis.  - No chest pain reported.    Decubitus ulcer - continue wound care consult    Dementia   CKD (chronic kidney disease), stage III - stable    Macrocytic anemia with vitamin B12 deficiency   Thrombocytopenia (HCC)   Abnormal LFTs   DVT prophylaxis: Heparin Code Status: Full Family Communication: None at bedside Disposition Plan: start disposition planning. Consulted Child psychotherapistsocial worker.   Consultants:   none   Procedures: None   Antimicrobials: pt on unasyn   Subjective: No chest pain reported. Reportedly patient is from home. Discussed with nursing disposition. Will start disposition planning  Objective: Vitals:   04/05/16 1852  04/05/16 2017 04/06/16 0608 04/06/16 0629  BP:  122/74 90/74   Pulse:  75 71   Resp:  18 18   Temp:  99 F (37.2 C) 98.7 F (37.1 C)   TempSrc:  Oral Oral   SpO2:  95% 92%   Weight: 57 kg (125 lb 10.6 oz)   76.4 kg (168 lb 6.9 oz)  Height:    5\' 10"  (1.778 m)    Intake/Output Summary (Last 24 hours) at 04/06/16 1239 Last data filed at 04/06/16 1213  Gross per 24 hour  Intake             1800 ml  Output              300 ml  Net             1500 ml   Filed Weights   04/04/16 0500 04/05/16 1852 04/06/16 0629  Weight: 58 kg (127 lb 13.9 oz) 57 kg (125 lb 10.6 oz) 76.4 kg (168 lb 6.9 oz)    Examination:  General exam: Appears calm and comfortable, in nad. Respiratory system: equal chest rise, rhales, no wheezes Cardiovascular system: S1 & S2 heard, RRR. No JVD, murmurs, rubs, gallops or clicks.  Gastrointestinal system: Abdomen is nondistended, soft and nontender. No organomegaly or masses felt. Normal bowel sounds heard. Central nervous system: moves extremities equally, no facial asymmetry Extremities: Symmetric 5 x 5 power. Skin: + ulcer sacrum 4 stage 2 pressure ulcers to scapulae and midthoracic spine Psychiatry: unable to accurately assess due to limited cooperation with examiner     Data Reviewed: I have personally reviewed following labs and imaging  studies  CBC:  Recent Labs Lab 04/02/16 1640 04/03/16 0424 04/06/16 0533  WBC 4.7 3.8* 6.1  NEUTROABS 3.5 2.9  --   HGB 12.3* 10.2* 9.3*  HCT 36.0* 29.3* 27.3*  MCV 103.2* 103.9* 103.0*  PLT 113* 90* 120*   Basic Metabolic Panel:  Recent Labs Lab 04/02/16 1640 04/03/16 0424 04/06/16 0533  NA 140 143 138  K 4.3 3.7 4.6  CL 102 108 109  CO2 29 27 23   GLUCOSE 102* 84 91  BUN 57* 55* 27*  CREATININE 1.34* 1.21 0.99  CALCIUM 9.2 8.3* 7.8*   GFR: Estimated Creatinine Clearance: 50.2 mL/min (by C-G formula based on SCr of 0.99 mg/dL). Liver Function Tests:  Recent Labs Lab 04/02/16 1640  04/03/16 0424  AST 180* 121*  ALT 93* 75*  ALKPHOS 40 32*  BILITOT 1.8* 1.1  PROT 7.2 5.5*  ALBUMIN 4.2 3.2*   No results for input(s): LIPASE, AMYLASE in the last 168 hours. No results for input(s): AMMONIA in the last 168 hours. Coagulation Profile: No results for input(s): INR, PROTIME in the last 168 hours. Cardiac Enzymes:  Recent Labs Lab 04/02/16 1640 04/02/16 2031 04/03/16 0424 04/03/16 1027 04/03/16 1720 04/06/16 0533  CKTOTAL 4,826*  --  2,900*  --   --  574*  TROPONINI  --  0.10* 0.11* 0.09* 0.09*  --    BNP (last 3 results) No results for input(s): PROBNP in the last 8760 hours. HbA1C: No results for input(s): HGBA1C in the last 72 hours. CBG:  Recent Labs Lab 04/04/16 0747 04/05/16 0735 04/06/16 0727  GLUCAP 99 108* 90   Lipid Profile: No results for input(s): CHOL, HDL, LDLCALC, TRIG, CHOLHDL, LDLDIRECT in the last 72 hours. Thyroid Function Tests: No results for input(s): TSH, T4TOTAL, FREET4, T3FREE, THYROIDAB in the last 72 hours. Anemia Panel: No results for input(s): VITAMINB12, FOLATE, FERRITIN, TIBC, IRON, RETICCTPCT in the last 72 hours. Sepsis Labs:  Recent Labs Lab 04/02/16 1701 04/02/16 2048 04/03/16 0000 04/04/16 0553 04/06/16 0533  PROCALCITON  --  2.53  --  1.06 0.43  LATICACIDVEN 2.34* 1.4 1.2  --   --     No results found for this or any previous visit (from the past 240 hour(s)).       Radiology Studies: No results found.      Scheduled Meds: . ampicillin-sulbactam (UNASYN) IV  3 g Intravenous Q8H  . chlorhexidine  15 mL Mouth/Throat BID  . collagenase   Topical Daily  . dorzolamide-timolol  1 drop Left Eye BID  . erythromycin   Both Eyes Q6H  . feeding supplement (ENSURE ENLIVE)  237 mL Oral BID BM  . heparin  5,000 Units Subcutaneous Q8H  . levothyroxine  75 mcg Oral QAC breakfast  . sodium chloride  1,000 mL Intravenous Once   Continuous Infusions: . sodium chloride 100 mL/hr at 04/05/16 1819  .  dextrose 5 % and 0.45 % NaCl with KCl 20 mEq/L 100 mL/hr at 04/05/16 1604     LOS: 4 days   Time spent: > 35 minutes  Penny PiaVEGA, Kanton Kamel, MD [facility] Pager [phone]  If 7PM-7AM, please contact night-coverage [email or web address] Password [email or web address] 04/06/2016, 12:39 PM

## 2016-04-07 DIAGNOSIS — Z801 Family history of malignant neoplasm of trachea, bronchus and lung: Secondary | ICD-10-CM

## 2016-04-07 DIAGNOSIS — Z79899 Other long term (current) drug therapy: Secondary | ICD-10-CM

## 2016-04-07 MED ORDER — SODIUM CHLORIDE 0.9 % IV BOLUS (SEPSIS)
1000.0000 mL | Freq: Once | INTRAVENOUS | Status: AC
  Administered 2016-04-07: 1000 mL via INTRAVENOUS

## 2016-04-07 NOTE — Progress Notes (Signed)
LCSWA met with patien at bedside, patient was able to engage in conversation minimal.  LCSWA confirmed patient has bed a Connor Miller once medically stable. LCSWA spoke with patient sister in law [name], she plans to complete admission paperwork today.  LCSWA dicussed process of Legal Guardianship with pt. Sister in law and answered questions to best of my knowledge.  No other concerns presented at this time.

## 2016-04-07 NOTE — Progress Notes (Signed)
PROGRESS NOTE    Connor Miller  ZOX:[id number] DOB: 07/16/1922 DOA: 04/02/2016 PCP: Sanda Lingerhomas Jones, MD   Brief Narrative:  82 y.o. male with medical history significant for hypertension, hypothyroidism, B12 deficiency with anemia, and dementia who presents to the emergency department after being found on the floor in his feces and urine. Pt presented with pneumonia likely aspiration, rhabdomyolysis, and elevated troponin with no reported chest pain.   Assessment & Plan:   Principal Problem:   CAP (community acquired pneumonia) - Patient still on Unasyn. Does not feel much improvement in his respiratory condition. Will continue to monitor in house - obtained speech therapy evaluation who is currently suspecting that patient is aspirating with all intake. - obtained MBS to further evaluate, reporting mild aspiration risk.  Active Problems:   Hypothyroidism - pt currently on synthroid    Essential hypertension, benign - normotensive off blood pressure medication    Failure to thrive in adult - up with assistance - obtain PT evaluation - consulted social worker    Rhabdomyolysis - CK trending down and was due to immobility  Elevated troponin - secondary most likely due to demand ischemia most likely from pneumonia and or rhabdomyolysis.  - No chest pain reported.    Decubitus ulcer - continue wound care consult    Dementia   CKD (chronic kidney disease), stage III - stable    Macrocytic anemia with vitamin B12 deficiency   Thrombocytopenia (HCC)   Abnormal LFTs   DVT prophylaxis: Heparin Code Status: Full Family Communication: None at bedside Disposition Plan: discussed with Child psychotherapistsocial worker. Obtaining psychiatry consultation for competency   Consultants:   none   Procedures: None   Antimicrobials: pt on unasyn   Subjective: No chest pain reported. Pt has no new complaints  Objective: Vitals:   04/06/16 1400 04/06/16 2116 04/07/16 0500 04/07/16 0608    BP: 115/72 120/70  (!) 142/83  Pulse: 83 86  67  Resp: 18 16  17   Temp: 98.2 F (36.8 C) 98.2 F (36.8 C)  97.5 F (36.4 C)  TempSrc: Oral Oral  Oral  SpO2: 90% 91%  91%  Weight:   78.1 kg (172 lb 2.9 oz)   Height:        Intake/Output Summary (Last 24 hours) at 04/07/16 1410 Last data filed at 04/07/16 1213  Gross per 24 hour  Intake          3628.33 ml  Output              200 ml  Net          3428.33 ml   Filed Weights   04/05/16 1852 04/06/16 0629 04/07/16 0500  Weight: 57 kg (125 lb 10.6 oz) 76.4 kg (168 lb 6.9 oz) 78.1 kg (172 lb 2.9 oz)    Examination:  General exam: Appears calm and comfortable, in nad. Respiratory system: equal chest rise, rhales, no wheezes Cardiovascular system: S1 & S2 heard, RRR. No JVD, murmurs, rubs, gallops or clicks.  Gastrointestinal system: Abdomen is nondistended, soft and nontender. No organomegaly or masses felt. Normal bowel sounds heard. Central nervous system: moves extremities equally, no facial asymmetry Extremities: Symmetric 5 x 5 power. Skin: + ulcer sacrum 4 stage 2 pressure ulcers to scapulae and midthoracic spine Psychiatry: unable to accurately assess due to limited cooperation with examiner     Data Reviewed: I have personally reviewed following labs and imaging studies  CBC:  Recent Labs Lab 04/02/16 1640 04/03/16 0424 04/06/16 0533  WBC 4.7 3.8* 6.1  NEUTROABS 3.5 2.9  --   HGB 12.3* 10.2* 9.3*  HCT 36.0* 29.3* 27.3*  MCV 103.2* 103.9* 103.0*  PLT 113* 90* 120*   Basic Metabolic Panel:  Recent Labs Lab 04/02/16 1640 04/03/16 0424 04/06/16 0533  NA 140 143 138  K 4.3 3.7 4.6  CL 102 108 109  CO2 29 27 23   GLUCOSE 102* 84 91  BUN 57* 55* 27*  CREATININE 1.34* 1.21 0.99  CALCIUM 9.2 8.3* 7.8*   GFR: Estimated Creatinine Clearance: 50.2 mL/min (by C-G formula based on SCr of 0.99 mg/dL). Liver Function Tests:  Recent Labs Lab 04/02/16 1640 04/03/16 0424  AST 180* 121*  ALT 93* 75*   ALKPHOS 40 32*  BILITOT 1.8* 1.1  PROT 7.2 5.5*  ALBUMIN 4.2 3.2*   No results for input(s): LIPASE, AMYLASE in the last 168 hours. No results for input(s): AMMONIA in the last 168 hours. Coagulation Profile: No results for input(s): INR, PROTIME in the last 168 hours. Cardiac Enzymes:  Recent Labs Lab 04/02/16 1640 04/02/16 2031 04/03/16 0424 04/03/16 1027 04/03/16 1720 04/06/16 0533  CKTOTAL 4,826*  --  2,900*  --   --  574*  TROPONINI  --  0.10* 0.11* 0.09* 0.09*  --    BNP (last 3 results) No results for input(s): PROBNP in the last 8760 hours. HbA1C: No results for input(s): HGBA1C in the last 72 hours. CBG:  Recent Labs Lab 04/04/16 0747 04/05/16 0735 04/06/16 0727  GLUCAP 99 108* 90   Lipid Profile: No results for input(s): CHOL, HDL, LDLCALC, TRIG, CHOLHDL, LDLDIRECT in the last 72 hours. Thyroid Function Tests: No results for input(s): TSH, T4TOTAL, FREET4, T3FREE, THYROIDAB in the last 72 hours. Anemia Panel: No results for input(s): VITAMINB12, FOLATE, FERRITIN, TIBC, IRON, RETICCTPCT in the last 72 hours. Sepsis Labs:  Recent Labs Lab 04/02/16 1701 04/02/16 2048 04/03/16 0000 04/04/16 0553 04/06/16 0533  PROCALCITON  --  2.53  --  1.06 0.43  LATICACIDVEN 2.34* 1.4 1.2  --   --     No results found for this or any previous visit (from the past 240 hour(s)).       Radiology Studies: No results found.      Scheduled Meds: . ampicillin-sulbactam (UNASYN) IV  3 g Intravenous Q6H  . chlorhexidine  15 mL Mouth/Throat BID  . collagenase   Topical Daily  . dorzolamide-timolol  1 drop Left Eye BID  . erythromycin   Both Eyes Q6H  . feeding supplement (ENSURE ENLIVE)  237 mL Oral BID BM  . heparin  5,000 Units Subcutaneous Q8H  . levothyroxine  75 mcg Oral QAC breakfast  . sodium chloride  1,000 mL Intravenous Once  . sodium chloride  1,000 mL Intravenous Once   Continuous Infusions: . sodium chloride 100 mL/hr at 04/07/16 0332  .  dextrose 5 % and 0.45 % NaCl with KCl 20 mEq/L Stopped (04/05/16 1819)     LOS: 5 days   Time spent: > 35 minutes  Penny PiaVEGA, Kemper Heupel, MD [facility] Pager [phone]  If 7PM-7AM, please contact night-coverage [email or web address] Password [email or web address] 04/07/2016, 2:10 PM

## 2016-04-07 NOTE — Progress Notes (Signed)
Speech Language Pathology Treatment:    Patient Details Name: Connor Miller MRN: [id number] DOB: April 20, 1923 Today's Date: 04/07/2016 Time: [id number] SLP Time Calculation (min) (ACUTE ONLY): 10 min  Assessment / Plan / Recommendation Clinical Impression  Pt with good tolerance of po per his statement and statement from tech.  He remains on oxygen and continues to clear his throat *he reports at same level as previously.  Reviewed aspiration precautions, mitigations with pt using teach back.  Observed pt consume tea and graham crackers *dentures placed by this SLP.  Good tolerance of po observed, no indication of aspiration or severe dysphagia.  Recommend advance dys3/ground meats/thin with strict precautions.  Will follow up to assure tolerance, pt educated and agreeable.    HPI HPI: Pt found down after multiple days.  Pt has degenerative cervical spine disease.  Chest x-ray features hazy perihilar opacities which could reflect CHF, aspiration, or pneumonia per MD note.  PMH + for dementia, anemia, diverticulosis.  US unremarkable except pleural effusion.  Pt was provided with 2 L/m supplemental oxygen and his O2 saturations normalized in ED per notes.  Antibiotics started for suspected community-acquired pneumonia.  Pt admited for aspiration pneumonia, as well as rhabdomyolysis and failure to thrive per ED notes.        SLP Plan  Continue with current plan of care     Recommendations  Liquids provided via: Cup;Straw Medication Administration: Whole meds with puree (crushed if large and not contraindicated) Supervision: Patient able to self feed (set up assist) Compensations: Slow rate;Small sips/bites;Follow solids with liquid (follow solid with liquid, intermittent dry swallow) Postural Changes and/or Swallow Maneuvers: Upright 30-60 min after meal (as upright as able)                Oral Care Recommendations: Oral care BID Follow up Recommendations:  (tbd) Plan: Continue with  current plan of care       GO                Mills KollerKimball, Anikka Marsan Ann Iniko Robles, MS [facility] SLP [phone]

## 2016-04-07 NOTE — Progress Notes (Signed)
Physical Therapy Treatment Patient Details Name: Derry SkillWilliam R Martel MRN: [id number] DOB: 1922/08/27 Today's Date: 04/07/2016    History of Present Illness 82 yo male admitted with Pna, fall, rhabdomyolysis. Hx of dementia, HTN, CKD    PT Comments    Progressing slowly with mobility. Pt was able to sit EOB ~8 minutes on today with Min-Mod assist to stabilize. Pt fatigues fairly easily. Assisted back to supine at end of session. Recommend SNF for continued rehab.   Follow Up Recommendations  SNF     Equipment Recommendations   (to be determined at next venue)    Recommendations for Other Services       Precautions / Restrictions Precautions Precautions: Fall Restrictions Weight Bearing Restrictions: No    Mobility  Bed Mobility Overal bed mobility: Needs Assistance Bed Mobility: Supine to Sit;Sit to Supine     Supine to sit: Max assist;HOB elevated Sit to supine: Max assist   General bed mobility comments: Assist for trunk and bil LEs. Increased time. Utilized bedpad for scooting, positioning. Sat EOB for at least 8 minutes with varying level of assist.   Transfers                 General transfer comment: NT-+2 assist not available to safely attempt  Ambulation/Gait                 Stairs            Wheelchair Mobility    Modified Rankin (Stroke Patients Only)       Balance Overall balance assessment: Needs assistance;History of Falls Sitting-balance support: Feet supported;Bilateral upper extremity supported Sitting balance-Leahy Scale: Poor Sitting balance - Comments: Sat EOB ~8 mintues with Mod-Min assist. Brief periods of Min guard assist.                             Cognition Arousal/Alertness: Awake/alert Behavior During Therapy: WFL for tasks assessed/performed Overall Cognitive Status: History of cognitive impairments - at baseline                      Exercises      General Comments        Pertinent  Vitals/Pain Pain Assessment: Faces Faces Pain Scale: Hurts little more Pain Location: pt did not state. grimacing and groaning noted with movement Pain Descriptors / Indicators: Grimacing Pain Intervention(s): Limited activity within patient's tolerance;Repositioned    Home Living                      Prior Function            PT Goals (current goals can now be found in the care plan section) Progress towards PT goals: Progressing toward goals    Frequency    Min 3X/week      PT Plan Current plan remains appropriate    Co-evaluation             End of Session   Activity Tolerance: Patient limited by fatigue Patient left: in bed;with call bell/phone within reach;with bed alarm set     Time: [id number] PT Time Calculation (min) (ACUTE ONLY): 21 min  Charges:  $Therapeutic Activity: 8-22 mins                    G Codes:      Rebeca AlertJannie Avonelle Viveros, MPT Pager: [phone]

## 2016-04-07 NOTE — Consult Note (Signed)
[facility] Consult   Reason for Consult:  Capacity evaluation Referring Physician:  Dr. Wendee Miller Patient Identification: Connor Miller MRN:  [id number] Principal Diagnosis: Dementia Diagnosis:   Patient Active Problem List   Diagnosis Date Noted  . Rhabdomyolysis [M62.82] 04/02/2016  . Hypoxia [R09.02] 04/02/2016  . Decubitus ulcer [L89.90] 04/02/2016  . Dementia [F03.90] 04/02/2016  . CKD (chronic kidney disease), stage III [N18.3] 04/02/2016  . Macrocytic anemia with vitamin B12 deficiency [D51.9] 04/02/2016  . Thrombocytopenia ([place]) [D69.6] 04/02/2016  . Acute respiratory failure with hypoxia ([place]) [J96.01] 04/02/2016  . Pneumonia [J18.9] 04/02/2016  . Abnormal LFTs [R79.89] 04/02/2016  . AKI (acute kidney injury) ([place]) [N17.9]   . Aspiration pneumonia ([place]) [J69.0]   . Non-traumatic rhabdomyolysis [M62.82]   . Absolute anemia [D64.9]   . Post-traumatic wound infection [T14.8XXA, L08.9] 03/20/2015  . Lumbago [M54.5] 02/08/2015  . Hip pain, bilateral [M25.551, M25.552] 02/08/2015  . Failure to thrive in adult [R62.7] 02/14/2014  . Complete rotator cuff tear of left shoulder [M75.122] 03/31/2013  . Atherosclerosis of native arteries of the extremities with intermittent claudication [I70.219] 01/20/2013  . Dermatitis, atopic [L20.9] 08/05/2012  . OAB (overactive bladder) [N32.81] 03/24/2012  . Constipation [564.0] 06/18/2011  . CAP (community acquired pneumonia) [J18.9] 05/28/2011  . BPH (benign prostatic hyperplasia) [N40.0] 05/07/2011  . Hypothyroidism [E03.9] 08/05/2010  . Essential hypertension, benign [I10] 08/05/2010    Total Time spent with patient: 45 minutes  Subjective:   Connor Miller is a 82 y.o. male patient admitted with Pneumonia and rhabdomyolysis.  HPI:  Connor Miller is a 82 y.o. male, seen, chart reviewed with the LCSW for this face-to-face psychiatry consultation and evaluation of capacity to make his own medical conditions and living  arrangements. Patient appeared lying on his bed, sleeping while closing his eyes but easily woken with verbal stimuli. Patient is a poor historian and also mumblings at a time. Patient appeared with significant memory deficits, patient is oriented to his name only and not able to keep his eyes open during this evaluation. Patient reported he does not know where he is at this current and also does not know his medical problems and required medical treatment. Unit LCSW stated that patient was looking more alert in the earlier morning but still with significant cognitive deficits including orientation, memory and has limited concentration and focus. It is difficult to evaluate for comprehensive history and examination because of his late stage dementia. Reportedly patient was not able to care for himself and not able to follow up with primary care physician appointments.  Medical history: Patient with medical history significant for hypertension, hypothyroidism, B12 deficiency with anemia, and dementia who presents to the emergency department after being found on the floor in his feces and urine. History is difficult to obtain secondary to the patient's advanced dementia, and history is therefore gleaned through discussion with the ED personnel, EMS report, and review of the EMR. Patient lives with his wife, who also has dementia by report, and has a sister-in-law that checks in on them. No one had checked on the patient and his wife for the past 2-3 weeks until is wife reportedly called 911 today to filed a missing person's report for the patient.patient was found down in a puddle of feces and urine without any specific complaints. Patient has apparently been a no-show for his PCP visits over the past year  Past Psychiatric History: Dementia  Risk to Self: Is patient at risk for suicide?: No Risk  to Others:   Prior Inpatient Therapy:   Prior Outpatient Therapy:    Past Medical History:  Past Medical  History:  Diagnosis Date  . Anemia    NOS  . BPH (benign prostatic hyperplasia)   . Colon polyps   . Dementia   . Diverticulosis   . Hypertension   . Hypothyroidism     Past Surgical History:  Procedure Laterality Date  . COLONOSCOPY     multiple  . EYE SURGERY    . [place]  . INGUINAL HERNIA REPAIR  04/1995  . VITRECTOMY  07/1990   for macular hole   Family History:  Family History  Problem Relation Age of Onset  . Lung cancer Father   . Lung cancer Brother   . Other Mother     aorta ruptured   Family Psychiatric  Hisstory: Reportedly patient has wife, 31 years old and also suffering with dementia.  patient has a few family members who supportive to him.  History  Alcohol Use No    Comment: occasionally,      History  Drug Use No    Social History   Social History  . Marital status: Married    Spouse name: N/A  . Number of children: N/A  . Years of education: N/A   Occupational History  . RETIRED    Social History Main Topics  . Smoking status: Never Smoker  . Smokeless tobacco: Never Used     Comment: "few cigarettes when young"  . Alcohol use No     Comment: occasionally,   . Drug use: No  . Sexual activity: Not Currently   Other Topics Concern  . None   Social History Narrative   Married still is with his wife in their house he is a retired Medical illustrator.    2 caffeinated beverages daily.    Regular Exercise - YES            Additional Social History:    Allergies:  No Known Allergies  Labs:  Results for orders placed or performed during the hospital encounter of 04/02/16 (from the past 48 hour(s))  CBC     Status: Abnormal   Collection Time: 04/06/16  5:33 AM  Result Value Ref Range   WBC 6.1 4.0 - 10.5 K/uL   RBC 2.65 (L) 4.22 - 5.81 MIL/uL   Hemoglobin 9.3 (L) 13.0 - 17.0 g/dL   HCT 27.3 (L) 39.0 - 52.0 %   MCV 103.0 (H) 78.0 - 100.0 fL   MCH 35.1 (H) 26.0 - 34.0 pg   MCHC 34.1 30.0 - 36.0 g/dL   RDW 14.1  11.5 - 15.5 %   Platelets 120 (L) 150 - 400 K/uL  Basic metabolic panel     Status: Abnormal   Collection Time: 04/06/16  5:33 AM  Result Value Ref Range   Sodium 138 135 - 145 mmol/L   Potassium 4.6 3.5 - 5.1 mmol/L   Chloride 109 101 - 111 mmol/L   CO2 23 22 - 32 mmol/L   Glucose, Bld 91 65 - 99 mg/dL   BUN 27 (H) 6 - 20 mg/dL   Creatinine, Ser 0.99 0.61 - 1.24 mg/dL   Calcium 7.8 (L) 8.9 - 10.3 mg/dL   GFR calc non Af Amer >60 >60 mL/min   GFR calc Af Amer >60 >60 mL/min    Comment: (NOTE) The eGFR has been calculated using the CKD EPI equation. This calculation has not been validated  in all clinical situations. eGFR's persistently <60 mL/min signify possible Chronic Kidney Disease.    Anion gap 6 5 - 15  Procalcitonin     Status: None   Collection Time: 04/06/16  5:33 AM  Result Value Ref Range   Procalcitonin 0.43 ng/mL    Comment:        Interpretation: PCT (Procalcitonin) <= 0.5 ng/mL: Systemic infection (sepsis) is not likely. Local bacterial infection is possible. (NOTE)         ICU PCT Algorithm               Non ICU PCT Algorithm    ----------------------------     ------------------------------         PCT < 0.25 ng/mL                 PCT < 0.1 ng/mL     Stopping of antibiotics            Stopping of antibiotics       strongly encouraged.               strongly encouraged.    ----------------------------     ------------------------------       PCT level decrease by               PCT < 0.25 ng/mL       >= 80% from peak PCT       OR PCT 0.25 - 0.5 ng/mL          Stopping of antibiotics                                             encouraged.     Stopping of antibiotics           encouraged.    ----------------------------     ------------------------------       PCT level decrease by              PCT >= 0.25 ng/mL       < 80% from peak PCT        AND PCT >= 0.5 ng/mL            Continuin g antibiotics                                               encouraged.       Continuing antibiotics            encouraged.    ----------------------------     ------------------------------     PCT level increase compared          PCT > 0.5 ng/mL         with peak PCT AND          PCT >= 0.5 ng/mL             Escalation of antibiotics                                          strongly encouraged.      Escalation of antibiotics        strongly encouraged.   CK  Status: Abnormal   Collection Time: 04/06/16  5:33 AM  Result Value Ref Range   Total CK 574 (H) 49 - 397 U/L  Glucose, capillary     Status: None   Collection Time: 04/06/16  7:27 AM  Result Value Ref Range   Glucose-Capillary 90 65 - 99 mg/dL    Current Facility-Administered Medications  Medication Dose Route Frequency Provider Last Rate Last Dose  . 0.9 %  sodium chloride infusion   Intravenous Continuous Velvet Bathe, MD 100 mL/hr at 04/07/16 0332    . acetaminophen (TYLENOL) tablet 650 mg  650 mg Oral Q6H PRN Vianne Bulls, MD       Or  . acetaminophen (TYLENOL) suppository 650 mg  650 mg Rectal Q6H PRN Ilene Qua Opyd, MD      . Ampicillin-Sulbactam (UNASYN) 3 g in sodium chloride 0.9 % 100 mL IVPB  3 g Intravenous Q6H Drew A Wofford, RPH   3 g at 04/07/16 0930  . chlorhexidine (PERIDEX) 0.12 % solution 15 mL  15 mL Mouth/Throat BID Velvet Bathe, MD   15 mL at 04/07/16 1106  . collagenase (SANTYL) ointment   Topical Daily Velvet Bathe, MD      . dextrose 5 % and 0.45 % NaCl with KCl 20 mEq/L infusion   Intravenous Continuous Velvet Bathe, MD   Stopped at 04/05/16 1819  . dorzolamide-timolol (COSOPT) 22.3-6.8 MG/ML ophthalmic solution 1 drop  1 drop Left Eye BID Vianne Bulls, MD   1 drop at 04/07/16 0930  . erythromycin ophthalmic ointment   Both Eyes Q6H Timothy S Opyd, MD      . feeding supplement (ENSURE ENLIVE) (ENSURE ENLIVE) liquid 237 mL  237 mL Oral BID BM Velvet Bathe, MD   237 mL at 04/07/16 1107  . heparin injection 5,000 Units  5,000 Units Subcutaneous Q8H Vianne Bulls, MD   5,000 Units at 04/07/16 0504  . ipratropium-albuterol (DUONEB) 0.5-2.5 (3) MG/3ML nebulizer solution 3 mL  3 mL Nebulization Q4H PRN Ilene Qua Opyd, MD      . levothyroxine (SYNTHROID, LEVOTHROID) tablet 75 mcg  75 mcg Oral QAC breakfast Vianne Bulls, MD   75 mcg at 04/07/16 0751  . morphine 2 MG/ML injection 1 mg  1 mg Intravenous Q3H PRN Vianne Bulls, MD   1 mg at 04/07/16 0554  . ondansetron (ZOFRAN) tablet 4 mg  4 mg Oral Q6H PRN Vianne Bulls, MD       Or  . ondansetron (ZOFRAN) injection 4 mg  4 mg Intravenous Q6H PRN Ilene Qua Opyd, MD      . sodium chloride 0.9 % bolus 1,000 mL  1,000 mL Intravenous Once Wenda Overland Little, MD      . sodium chloride 0.9 % bolus 1,000 mL  1,000 mL Intravenous Once Velvet Bathe, MD   1,000 mL at 04/07/16 1107    Musculoskeletal: Strength & Muscle Tone: decreased Gait & Station: unable to stand Patient leans: N/A  Psychiatric Specialty Exam: Physical Exam as per history and physical   Review of Systems  Unable to perform ROS: Dementia     Blood pressure (!) 142/83, pulse 67, temperature 97.5 F (36.4 C), temperature source Oral, resp. rate 17, height 5' 10"  (1.778 m), weight 78.1 kg (172 lb 2.9 oz), SpO2 91 %.Body mass index is 24.71 kg/m.  General Appearance: Bizarre and Disheveled  Eye Contact:  Minimal  Speech:  Slow and Slurred  Volume:  Decreased  Mood:  Anxious  Affect:  Flat  Thought Process:  Irrelevant  Orientation:  Other:  Oriented to his name only  Thought Content:  NA  Suicidal Thoughts:  No  Homicidal Thoughts:  No  Memory:  Immediate;   Poor Recent;   Poor  Judgement:  Poor  Insight:  Negative  Psychomotor Activity:  Decreased  Concentration:  Concentration: Poor and Attention Span: Poor  Recall:  Poor  Fund of Knowledge:  Poor  Language:  Fair  Akathisia:  Negative  Handed:  Right  AIMS (if indicated):     Assets:  Catering manager Housing Leisure Time  ADL's:  Impaired   Cognition:  Impaired,  Severe  Sleep:        Treatment Plan Summary: 82 years old male with a severe dementia admitted to hospital with pneumonia and rhabdomyolysis. Review of labs indicated a total CK is 574 and required IV antibiotics. Patient was seen for capacity evaluation, patient is a poor historian secondary to severe cognitive deficits and unable to answer simple questions regarding orientation.  Based on my evaluation patient does not meet criteria for capacity to make his own medical decisions or living arrangements.   Referred to units social service to contact his family members regarding power of attorney for medical care and living arrangements.  Patient benefit from skilled nursing facility with memory unit.   Disposition: Supportive therapy provided about ongoing stressors.  Ambrose Finland, MD 04/07/2016 1:43 PM

## 2016-04-07 NOTE — Care Management Important Message (Signed)
Important Message  Patient Details  Name: Connor Miller MRN: [id number] Date of Birth: [birth year]   Medicare Important Message Given:  Yes    Haskell FlirtJamison, Tashera Montalvo 04/07/2016, 11:29 AMImportant Message  Patient Details  Name: Connor Miller MRN: [id number] Date of Birth: [birth year]   Medicare Important Message Given:  Yes    Haskell FlirtJamison, Lailie Smead 04/07/2016, 11:29 AM

## 2016-04-07 NOTE — Progress Notes (Signed)
PHARMACY NOTE -  Unasyn  Pharmacy has been assisting with dosing of Unasyn for aspiration PNA. Dosage remains stable at 3gm IV q6h and need for further dosage adjustment appears unlikely at present.    Currently on day#6 antibiotics.  Duration of therapy per MD- recommend 7 days if pt clinically improved.  Change to oral therapy to complete course once appropriate.  Will sign off at this time.  Please reconsult if a change in clinical status warrants re-evaluation of dosage.  Junita PushMichelle Beverley Allender, PharmD, BCPS Pager: [phone] 04/07/2016@9 :57 AM

## 2016-04-08 ENCOUNTER — Inpatient Hospital Stay (HOSPITAL_COMMUNITY): Payer: Medicare Other

## 2016-04-08 DIAGNOSIS — F0391 Unspecified dementia with behavioral disturbance: Secondary | ICD-10-CM

## 2016-04-08 LAB — FOLATE RBC
Folate, Hemolysate: 549.1 ng/mL
Folate, RBC: UNDETERMINED ng/mL

## 2016-04-08 LAB — GLUCOSE, CAPILLARY
GLUCOSE-CAPILLARY: 81 mg/dL (ref 65–99)
Glucose-Capillary: 117 mg/dL — ABNORMAL HIGH (ref 65–99)

## 2016-04-08 MED ORDER — VANCOMYCIN HCL IN DEXTROSE 1-5 GM/200ML-% IV SOLN
1000.0000 mg | INTRAVENOUS | Status: DC
  Administered 2016-04-08 – 2016-04-09 (×2): 1000 mg via INTRAVENOUS
  Filled 2016-04-08 (×2): qty 200

## 2016-04-08 MED ORDER — VANCOMYCIN HCL IN DEXTROSE 1-5 GM/200ML-% IV SOLN: 1000.0000 mg | Freq: Once | INTRAVENOUS | Status: DC

## 2016-04-08 MED ORDER — SODIUM CHLORIDE 0.9 % IV SOLN
INTRAVENOUS | Status: DC
  Administered 2016-04-09 – 2016-04-10 (×3): via INTRAVENOUS

## 2016-04-08 MED ORDER — PIPERACILLIN-TAZOBACTAM 3.375 G IVPB
3.3750 g | Freq: Three times a day (TID) | INTRAVENOUS | Status: DC
  Administered 2016-04-08 – 2016-04-11 (×10): 3.375 g via INTRAVENOUS
  Filled 2016-04-08 (×11): qty 50

## 2016-04-08 NOTE — Progress Notes (Signed)
LCSWA met with patient family and discussed pt.current status and disposition. Patient sister in law reports she has gather information from the courts and plans to start process for legal guardianship for pt. and his wife.   Family reports no other concerns at this time.

## 2016-04-08 NOTE — Progress Notes (Signed)
Speech Language Pathology Treatment: Dysphagia  Patient Details Name: Connor Miller MRN: [id number] DOB: 12-Dec-1922 Today's Date: 04/08/2016 Time: 1535-1550 SLP Time Calculation (min) (ACUTE ONLY): 15 min  Assessment / Plan / Recommendation Clinical Impression  Pt today with eyes rolling up under his upper eyelids - he denies awareness to this phenomenon  - RN made aware.  Pt accepted intake of gingerale, icecream and crackers.  NO overt indication of aspiration with po observed however pt continually clears his throat after intake.   Note CXR findings worse = Pt continues to deny dysphagia and states swallow is at baseline.  Given findings, SLP will follow up- pt will need full supervision with po currently due to weakness and dysphagia.    HPI HPI: Pt found down after multiple days.  Pt has degenerative cervical spine disease.  Chest x-ray features hazy perihilar opacities which could reflect CHF, aspiration, or pneumonia per MD note.  PMH + for dementia, anemia, diverticulosis.  US unremarkable except pleural effusion.  Pt was provided with 2 L/m supplemental oxygen and his O2 saturations normalized in ED per notes.  Antibiotics started for suspected community-acquired pneumonia.  Pt admited for aspiration pneumonia, as well as rhabdomyolysis and failure to thrive per ED notes.        SLP Plan  Continue with current plan of care     Recommendations  Diet recommendations: Dysphagia 3 (mechanical soft);Thin liquid Liquids provided via: Cup;Straw Medication Administration: Whole meds with puree Supervision: Full supervision/cueing for compensatory strategies (full supervision for now) Compensations: Minimize environmental distractions;Slow rate;Small sips/bites;Follow solids with liquid (intermittent dry swallow) Postural Changes and/or Swallow Maneuvers: Seated upright 90 degrees;Upright 30-60 min after meal                Oral Care Recommendations: Oral care BID Follow up  Recommendations: Skilled Nursing facility Plan: Continue with current plan of care       GO               Connor Burnetamara Domenick Quebedeaux, MS Connor Miller SLP [phone]  [name], Connor Miller 04/08/2016, 7:20 PM

## 2016-04-08 NOTE — Progress Notes (Signed)
Per speech therapy, patient having new eye movement that was not present the past few days.  Eyes seem to roll upward when patient is talking. When asked to look forward, patient is able to do so, however eyes will again move up/ behind eyelids. The patient states that he is unaware of this eye movement. Neuro assessment shows no other changes. MD Cena BentonVega made aware, orders to check neuro status more frequently.

## 2016-04-08 NOTE — Progress Notes (Signed)
PROGRESS NOTE    Connor SkillWilliam R Miller  ZOX:[id number] DOB: 08/22/1922 DOA: 04/02/2016 PCP: Sanda Lingerhomas Jones, MD   Brief Narrative:  82 y.o. male with medical history significant for hypertension, hypothyroidism, B12 deficiency with anemia, and dementia who presents to the emergency department after being found on the floor in his feces and urine. Pt presented with pneumonia likely aspiration, rhabdomyolysis, and elevated troponin with no reported chest pain.  Assessment & Plan:   Principal Problem:   CAP (community acquired pneumonia) - Pt was on unasyn but chest xray reported worsening in pneumonia. Will broaden antibiotic regimen. Also report of pleural effusion most likely related to pna. Should patient's breathing status get worse would plan on placing order for thoracentesis - obtained speech therapy evaluation who is currently suspecting that patient is aspirating. - obtained MBS to further evaluate, reporting mild aspiration risk.  Active Problems:   Hypothyroidism - pt currently on synthroid    Essential hypertension, benign - normotensive off blood pressure medication    Failure to thrive in adult - up with assistance - obtain PT evaluation - consulted social worker - according to last lab his bun/creatinine ratio elevated which may mean patient may still be dehydrated. Will provide gentle fluid hydration.    Rhabdomyolysis - CK trending down and was due to immobility  Elevated troponin - secondary most likely due to demand ischemia most likely from pneumonia and or rhabdomyolysis.  - No chest pain reported.    Decubitus ulcer - continue wound care consult    Dementia   CKD (chronic kidney disease), stage III - stable    Macrocytic anemia with vitamin B12 deficiency   Thrombocytopenia (HCC)   Abnormal LFTs   DVT prophylaxis: Heparin Code Status: Full Family Communication: None at bedside Disposition Plan: discussed with Child psychotherapistsocial worker. Obtaining psychiatry  consultation for competency   Consultants:   none   Procedures: None   Antimicrobials: pt on unasyn   Subjective: Pt has no new complaints reported to me. Reportedly had low urine output overnight.   Objective: Vitals:   04/07/16 1420 04/07/16 2121 04/08/16 0509 04/08/16 1053  BP: 108/74 125/78 118/70   Pulse: 68 76 73   Resp: 18 20 18  (!) 23  Temp: 98 F (36.7 C) 98.6 F (37 C) 98.6 F (37 C)   TempSrc: Oral Oral Oral   SpO2: 96% 95%    Weight:   81.6 kg (180 lb)   Height:        Intake/Output Summary (Last 24 hours) at 04/08/16 1321 Last data filed at 04/08/16 1100  Gross per 24 hour  Intake          5120.74 ml  Output              600 ml  Net          4520.74 ml   Filed Weights   04/06/16 0629 04/07/16 0500 04/08/16 0509  Weight: 76.4 kg (168 lb 6.9 oz) 78.1 kg (172 lb 2.9 oz) 81.6 kg (180 lb)    Examination:  General exam: Appears calm and comfortable, in nad. Respiratory system: equal chest rise, rhales, no wheezes, decrease breath sounds at bases Cardiovascular system: S1 & S2 heard, RRR. No JVD, murmurs, rubs, gallops or clicks.   Gastrointestinal system: Abdomen is nondistended, soft and nontender. No organomegaly or masses felt. Normal bowel sounds heard. Central nervous system: moves extremities equally, no facial asymmetry Extremities: Symmetric 5 x 5 power. Skin: + ulcer sacrum 4 stage 2 pressure  ulcers to scapulae and midthoracic spine Psychiatry: unable to accurately assess due to limited cooperation with examiner     Data Reviewed: I have personally reviewed following labs and imaging studies  CBC:  Recent Labs Lab 04/02/16 1640 04/02/16 2048 04/03/16 0424 04/06/16 0533  WBC 4.7  --  3.8* 6.1  NEUTROABS 3.5  --  2.9  --   HGB 12.3*  --  10.2* 9.3*  HCT 36.0* REJ5 29.3* 27.3*  MCV 103.2*  --  103.9* 103.0*  PLT 113*  --  90* 120*   Basic Metabolic Panel:  Recent Labs Lab 04/02/16 1640 04/03/16 0424 04/06/16 0533  NA 140  143 138  K 4.3 3.7 4.6  CL 102 108 109  CO2 29 27 23   GLUCOSE 102* 84 91  BUN 57* 55* 27*  CREATININE 1.34* 1.21 0.99  CALCIUM 9.2 8.3* 7.8*   GFR: Estimated Creatinine Clearance: 50.2 mL/min (by C-G formula based on SCr of 0.99 mg/dL). Liver Function Tests:  Recent Labs Lab 04/02/16 1640 04/03/16 0424  AST 180* 121*  ALT 93* 75*  ALKPHOS 40 32*  BILITOT 1.8* 1.1  PROT 7.2 5.5*  ALBUMIN 4.2 3.2*   No results for input(s): LIPASE, AMYLASE in the last 168 hours. No results for input(s): AMMONIA in the last 168 hours. Coagulation Profile: No results for input(s): INR, PROTIME in the last 168 hours. Cardiac Enzymes:  Recent Labs Lab 04/02/16 1640 04/02/16 2031 04/03/16 0424 04/03/16 1027 04/03/16 1720 04/06/16 0533  CKTOTAL 4,826*  --  2,900*  --   --  574*  TROPONINI  --  0.10* 0.11* 0.09* 0.09*  --    BNP (last 3 results) No results for input(s): PROBNP in the last 8760 hours. HbA1C: No results for input(s): HGBA1C in the last 72 hours. CBG:  Recent Labs Lab 04/04/16 0747 04/05/16 0735 04/06/16 0727 04/08/16 0809 04/08/16 1144  GLUCAP 99 108* 90 81 117*   Lipid Profile: No results for input(s): CHOL, HDL, LDLCALC, TRIG, CHOLHDL, LDLDIRECT in the last 72 hours. Thyroid Function Tests: No results for input(s): TSH, T4TOTAL, FREET4, T3FREE, THYROIDAB in the last 72 hours. Anemia Panel: No results for input(s): VITAMINB12, FOLATE, FERRITIN, TIBC, IRON, RETICCTPCT in the last 72 hours. Sepsis Labs:  Recent Labs Lab 04/02/16 1701 04/02/16 2048 04/03/16 0000 04/04/16 0553 04/06/16 0533  PROCALCITON  --  2.53  --  1.06 0.43  LATICACIDVEN 2.34* 1.4 1.2  --   --     No results found for this or any previous visit (from the past 240 hour(s)).       Radiology Studies: Dg Chest Port 1 View  Result Date: 04/08/2016 CLINICAL DATA:  Wheezing and shortness breath EXAM: PORTABLE CHEST 1 VIEW COMPARISON:  04/02/2016 FINDINGS: The worsened right upper  lobe consolidation and volume loss. New consolidation in the left lower lobe. New pleural effusions, right greater than left. IMPRESSION: Multifocal consolidation, worsening. New/enlarging pleural effusions. This most likely represents multifocal pneumonia with parapneumonic effusions. Electronically Signed   By: Ellery Plunkaniel R Mitchell M.D.   On: 04/08/2016 06:32        Scheduled Meds: . chlorhexidine  15 mL Mouth/Throat BID  . collagenase   Topical Daily  . dorzolamide-timolol  1 drop Left Eye BID  . erythromycin   Both Eyes Q6H  . feeding supplement (ENSURE ENLIVE)  237 mL Oral BID BM  . heparin  5,000 Units Subcutaneous Q8H  . levothyroxine  75 mcg Oral QAC breakfast  . piperacillin-tazobactam (ZOSYN)  IV  3.375 g Intravenous Q8H  . sodium chloride  1,000 mL Intravenous Once  . vancomycin  1,000 mg Intravenous Q24H   Continuous Infusions: . sodium chloride 50 mL/hr at 04/08/16 1049  . dextrose 5 % and 0.45 % NaCl with KCl 20 mEq/L Stopped (04/05/16 1819)     LOS: 6 days   Time spent: > 35 minutes  Penny Pia, MD [facility] Pager [phone]  If 7PM-7AM, please contact night-coverage [email or web address] Password [email or web address] 04/08/2016, 1:21 PM

## 2016-04-08 NOTE — Progress Notes (Signed)
Nutrition Follow-up  DOCUMENTATION CODES:   Severe malnutrition in context of chronic illness, Underweight  INTERVENTION:  Continue Ensure Enlive po BID, each supplement provides 350 kcal and 20 grams of protein.  NUTRITION DIAGNOSIS:   Malnutrition (Severe) related to chronic illness as evidenced by severe depletion of muscle mass, severe depletion of body fat.  Ongoing.  GOAL:   Patient will meet greater than or equal to 90% of their needs  Met.  MONITOR:   PO intake, Supplement acceptance, Labs, I & O's, Weight trends  REASON FOR ASSESSMENT:   Malnutrition Screening Tool    ASSESSMENT:   82 y.o.malewith medical history significant for hypertension, hypothyroidism, B12 deficiency with anemia, and dementia who presents to the emergency department after being found on the floor. Pt presented with pneumonia likely aspiration, rhabdomyolysis, and elevated troponin with no reported chest pain.  -Diet advanced to Dysphagia 3 with thin liquids on 10/30. Note in to provide ground meats with extra gravy/sauce.   Patient reports his appetite is good. Denies N/V or abdominal pain. Although it is documented that patient is receiving 2 Ensure Enlive daily, when RD asked if patient liked Ensure he reports he has not been drinking them. May just be confusion in setting of dementia.  Meal Completion: 100% per chart In the past 24 hours patient has had 1912 kcal (100% estimated calorie needs) and 73 grams protein (100% estimated protein needs), even without counting Ensure Enlive.  Medications reviewed and include: levothyroxine, NS @ 50 ml/hr.  Labs reviewed: CBG 81-117. On 10/29 BUN 27.   Weight trend: initial weights from 10/25-10/28 ranged between 127-131 lbs, which is within his most recent weight history range. Since 10/29, recorded weight has been between 168-180 lbs.   Discussed with RN. Weight discrepancy likely due to bed scale not being zeroed anymore, likely not accurate  even in setting of pleural effusion.  Diet Order:  DIET DYS 3 Room service appropriate? Yes; Fluid consistency: Thin  Skin:  Wound (see comment) (4 Stage II to scapulae & mid-thoracic spine)  Last BM:  04/02/2016  Height:   Ht Readings from Last 1 Encounters:  04/06/16 5' 10"  (1.778 m)    Weight:   Wt Readings from Last 1 Encounters:  04/08/16 180 lb (81.6 kg)    Ideal Body Weight:  75.45 kg  BMI:  Body mass index is 25.83 kg/m.  Estimated Nutritional Needs:   Kcal:  1400-1600 (MSJ x 1.2-1.3)  Protein:  70-87 grams (1.2-1.5 grams/kg)  Fluid:  1.4-1.6 L/day  EDUCATION NEEDS:   Education needs no appropriate at this time  Willey Blade, MS, RD, LDN Pager: [phone] After Hours Pager: [phone]

## 2016-04-08 NOTE — Progress Notes (Signed)
Dr. Cena BentonVega paged to ensure his awareness of this mornings chest xray results.

## 2016-04-08 NOTE — Progress Notes (Addendum)
Pharmacy Antibiotic Note  Connor Miller is a 82 y.o. male admitted on 04/02/2016 with pneumonia, thought likely secondary to aspiration .  Patient has been treated with Unasyn since admission but today's CXR shows worsening, now with multifocal consolidation.   Empiric antibiotic regimen is therefore being changed to Zosyn + vancomycin and pharmacy has been consulted for dosing.  Plan: 1. Zosyn 3.375 grams IV q8h (extended-infusion) 2. Vancomycin 1 gram IV q24h (dosage slightly lower than nomogram due to risk factors for toxicity - advanced age and concomitant Zosyn) 3. Follow serum creatinine, culture results, clinical course.   Check vancomycin trough unless de-escalation feasible.   Height: 5\' 10"  (177.8 cm) Weight: 180 lb (81.6 kg) IBW/kg (Calculated) : 73  Temp (24hrs), Avg:98.4 F (36.9 C), Min:98 F (36.7 C), Max:98.6 F (37 C)   Recent Labs Lab 04/02/16 1640 04/02/16 1701 04/02/16 2048 04/03/16 0000 04/03/16 0424 04/06/16 0533  WBC 4.7  --   --   --  3.8* 6.1  CREATININE 1.34*  --   --   --  1.21 0.99  LATICACIDVEN  --  2.34* 1.4 1.2  --   --     Estimated Creatinine Clearance: 50.2 mL/min (by C-G formula based on SCr of 0.99 mg/dL).    No Known Allergies  Antimicrobials this admission: 10/25 Ceftriaxone x1 10/25 Unasyn >>10/31 10/31 Zosyn  >> 10/31 vancomycin >>   Microbiology results: 10/25 Urine strep Ag: neg 10/25 Urine legionella Ag: neg   Thank you for allowing pharmacy to be a part of this patient's care.  Elie Goodyandy Ivalene Platte, PharmD, BCPS Pager: [phone] 04/08/2016  9:56 AM

## 2016-04-09 ENCOUNTER — Inpatient Hospital Stay (HOSPITAL_COMMUNITY): Payer: Medicare Other

## 2016-04-09 DIAGNOSIS — J9600 Acute respiratory failure, unspecified whether with hypoxia or hypercapnia: Secondary | ICD-10-CM

## 2016-04-09 DIAGNOSIS — F0280 Dementia in other diseases classified elsewhere without behavioral disturbance: Secondary | ICD-10-CM

## 2016-04-09 LAB — ECHOCARDIOGRAM COMPLETE
Height: 70 in
Weight: 2880 oz

## 2016-04-09 LAB — BASIC METABOLIC PANEL
Anion gap: 4 — ABNORMAL LOW (ref 5–15)
BUN: 22 mg/dL — ABNORMAL HIGH (ref 6–20)
BUN: 22 mg/dL — AB (ref 4–21)
CALCIUM: 7.9 mg/dL — AB (ref 8.9–10.3)
CO2: 26 mmol/L (ref 22–32)
CREATININE: 1.11 mg/dL (ref 0.61–1.24)
Chloride: 105 mmol/L (ref 101–111)
Creatinine: 1.1 mg/dL (ref 0.6–1.3)
GFR calc non Af Amer: 56 mL/min — ABNORMAL LOW (ref >60)
GLUCOSE: 91 mg/dL
Glucose, Bld: 91 mg/dL (ref 65–99)
Potassium: 4 mmol/L (ref 3.4–5.3)
Potassium: 4 mmol/L (ref 3.5–5.1)
SODIUM: 135 mmol/L — AB (ref 137–147)
SODIUM: 135 mmol/L (ref 135–145)

## 2016-04-09 LAB — CBC
HCT: 25.8 % — ABNORMAL LOW (ref 39.0–52.0)
Hemoglobin: 8.9 g/dL — ABNORMAL LOW (ref 13.0–17.0)
MCH: 36.3 pg — AB (ref 26.0–34.0)
MCHC: 34.5 g/dL (ref 30.0–36.0)
MCV: 105.3 fL — ABNORMAL HIGH (ref 78.0–100.0)
PLATELETS: 203 10*3/uL (ref 150–400)
RBC: 2.45 MIL/uL — AB (ref 4.22–5.81)
RDW: 14.6 % (ref 11.5–15.5)
WBC: 8.7 10*3/uL (ref 4.0–10.5)

## 2016-04-09 LAB — CREATININE, SERUM
CREATININE: 1.08 mg/dL (ref 0.61–1.24)
GFR calc Af Amer: >60 mL/min (ref >60)
GFR calc non Af Amer: 58 mL/min — ABNORMAL LOW (ref >60)

## 2016-04-09 LAB — GLUCOSE, CAPILLARY: GLUCOSE-CAPILLARY: 89 mg/dL (ref 65–99)

## 2016-04-09 LAB — CBC AND DIFFERENTIAL
HEMATOCRIT: 26 % — AB (ref 41–53)
Hemoglobin: 8.9 g/dL — AB (ref 13.5–17.5)
Platelets: 203 10*3/uL (ref 150–399)
WBC: 8.7 10*3/mL

## 2016-04-09 MED ORDER — LEVOTHYROXINE SODIUM 100 MCG PO TABS
100.0000 ug | ORAL_TABLET | Freq: Every day | ORAL | Status: DC
  Administered 2016-04-10 – 2016-04-11 (×2): 100 ug via ORAL
  Filled 2016-04-09 (×2): qty 1

## 2016-04-09 MED ORDER — FUROSEMIDE 10 MG/ML IJ SOLN
40.0000 mg | Freq: Once | INTRAMUSCULAR | Status: AC
  Administered 2016-04-09: 40 mg via INTRAVENOUS
  Filled 2016-04-09: qty 4

## 2016-04-09 NOTE — Progress Notes (Signed)
PROGRESS NOTE    Connor Miller  WUJ:[id number] DOB: 1922/09/14 DOA: 04/02/2016 PCP: Sanda Linger, MD   Brief Narrative:  82 y.o. male with medical history significant for hypertension, hypothyroidism, B12 deficiency with anemia, and dementia who presents to the emergency department after being found on the floor in his feces and urine. Pt presented with pneumonia likely aspiration, rhabdomyolysis, and elevated troponin with no reported chest pain.  Assessment & Plan:       CAP (community acquired pneumonia) - Pt was on unasyn but chest xray reported worsening in pneumonia. Currently on broad-spectrum antibiotic regimen. Most recent chest x-ray shows parapneumonic effusion. Currently on 2 L of oxygen. Dysphagia 3 diet Attempt diuresis to see the effusions improve Pro calcitonin 0.43, white blood cell count is normal, patient afebrile overnight   Abnormal troponin 0.10>0.11>0.09, likely demand ischemia 2-D echo to rule out wall motion abnormalities   from pneumonia and or rhabdomyolysis.    Hypothyroidism - pt currently on synthroid TSH 33.99, free T4 0.55, increase Synthroid to 100 g a day     Essential hypertension, benign - normotensive off blood pressure medication    Failure to thrive in adult - up with assistance PT recommends SNF       Rhabdomyolysis - CK trending down and was due to immobility       Decubitus ulcer,POA :Unstageable pressure injury to sacrum, 4 Stage 2 pressure injuries to the scapulae and mid-thoracic spine    Dementia-stable , needs snf    CKD (chronic kidney disease), stage III, baseline 1.1 - stable    Macrocytic anemia with vitamin B12 deficiency     Thrombocytopenia (HCC)-resolved    Abnormal LFTs likely due to elevated ck, monitor    DVT prophylaxis: Heparin Code Status: Full Family Communication: None at bedside Disposition Plan: Patient currently not stable for discharge, anticipate discharge in one to 2  days   Consultants:   none   Procedures: None   Antimicrobials: pt on unasyn   Subjective: generalized anasarca, denies cp,sob    Objective: Vitals:   04/08/16 1053 04/08/16 1350 04/08/16 2200 04/09/16 0434  BP:  (!) 128/94 117/61 (!) 113/97  Pulse:  85 93 77  Resp: (!) 23 18 17 18   Temp:  97.4 F (36.3 C) 97.5 F (36.4 C) 98.1 F (36.7 C)  TempSrc:  Axillary Oral Oral  SpO2:  94% 90% 92%  Weight:      Height:        Intake/Output Summary (Last 24 hours) at 04/09/16 0903 Last data filed at 04/09/16 0748  Gross per 24 hour  Intake          2376.67 ml  Output              675 ml  Net          1701.67 ml   Filed Weights   04/06/16 0629 04/07/16 0500 04/08/16 0509  Weight: 76.4 kg (168 lb 6.9 oz) 78.1 kg (172 lb 2.9 oz) 81.6 kg (180 lb)    Examination:  General exam: Appears calm and comfortable, in nad.generalized anasarca Respiratory system: equal chest rise, rhales, no wheezes, decrease breath sounds at bases Cardiovascular system: S1 & S2 heard, RRR. No JVD, murmurs, rubs, gallops or clicks.   Gastrointestinal system: Abdomen is nondistended, soft and nontender. No organomegaly or masses felt. Normal bowel sounds heard. Central nervous system: moves extremities equally, no facial asymmetry Extremities: Symmetric 5 x 5 power. Skin: + ulcer sacrum 4 stage 2  pressure ulcers to scapulae and midthoracic spine Psychiatry: unable to accurately assess due to limited cooperation with examiner     Data Reviewed: I have personally reviewed following labs and imaging studies  CBC:  Recent Labs Lab 04/02/16 1640 04/02/16 2048 04/03/16 0424 04/06/16 0533 04/09/16 0509  WBC 4.7  --  3.8* 6.1 8.7  NEUTROABS 3.5  --  2.9  --   --   HGB 12.3*  --  10.2* 9.3* 8.9*  HCT 36.0* REJ5 29.3* 27.3* 25.8*  MCV 103.2*  --  103.9* 103.0* 105.3*  PLT 113*  --  90* 120* 203   Basic Metabolic Panel:  Recent Labs Lab 04/02/16 1640 04/03/16 0424 04/06/16 0533  04/09/16 0509  NA 140 143 138 135  K 4.3 3.7 4.6 4.0  CL 102 108 109 105  CO2 29 27 23 26   GLUCOSE 102* 84 91 91  BUN 57* 55* 27* 22*  CREATININE 1.34* 1.21 0.99 1.08  1.11  CALCIUM 9.2 8.3* 7.8* 7.9*   GFR: Estimated Creatinine Clearance: 44.8 mL/min (by C-G formula based on SCr of 1.11 mg/dL). Liver Function Tests:  Recent Labs Lab 04/02/16 1640 04/03/16 0424  AST 180* 121*  ALT 93* 75*  ALKPHOS 40 32*  BILITOT 1.8* 1.1  PROT 7.2 5.5*  ALBUMIN 4.2 3.2*   No results for input(s): LIPASE, AMYLASE in the last 168 hours. No results for input(s): AMMONIA in the last 168 hours. Coagulation Profile: No results for input(s): INR, PROTIME in the last 168 hours. Cardiac Enzymes:  Recent Labs Lab 04/02/16 1640 04/02/16 2031 04/03/16 0424 04/03/16 1027 04/03/16 1720 04/06/16 0533  CKTOTAL 4,826*  --  2,900*  --   --  574*  TROPONINI  --  0.10* 0.11* 0.09* 0.09*  --    BNP (last 3 results) No results for input(s): PROBNP in the last 8760 hours. HbA1C: No results for input(s): HGBA1C in the last 72 hours. CBG:  Recent Labs Lab 04/05/16 0735 04/06/16 0727 04/08/16 0809 04/08/16 1144 04/09/16 0721  GLUCAP 108* 90 81 117* 89   Lipid Profile: No results for input(s): CHOL, HDL, LDLCALC, TRIG, CHOLHDL, LDLDIRECT in the last 72 hours. Thyroid Function Tests: No results for input(s): TSH, T4TOTAL, FREET4, T3FREE, THYROIDAB in the last 72 hours. Anemia Panel: No results for input(s): VITAMINB12, FOLATE, FERRITIN, TIBC, IRON, RETICCTPCT in the last 72 hours. Sepsis Labs:  Recent Labs Lab 04/02/16 1701 04/02/16 2048 04/03/16 0000 04/04/16 0553 04/06/16 0533  PROCALCITON  --  2.53  --  1.06 0.43  LATICACIDVEN 2.34* 1.4 1.2  --   --     No results found for this or any previous visit (from the past 240 hour(s)).       Radiology Studies: Dg Chest Port 1 View  Result Date: 04/08/2016 CLINICAL DATA:  Wheezing and shortness breath EXAM: PORTABLE CHEST 1  VIEW COMPARISON:  04/02/2016 FINDINGS: The worsened right upper lobe consolidation and volume loss. New consolidation in the left lower lobe. New pleural effusions, right greater than left. IMPRESSION: Multifocal consolidation, worsening. New/enlarging pleural effusions. This most likely represents multifocal pneumonia with parapneumonic effusions. Electronically Signed   By: Ellery Plunkaniel R Mitchell M.D.   On: 04/08/2016 06:32        Scheduled Meds: . chlorhexidine  15 mL Mouth/Throat BID  . collagenase   Topical Daily  . dorzolamide-timolol  1 drop Left Eye BID  . erythromycin   Both Eyes Q6H  . feeding supplement (ENSURE ENLIVE)  237 mL Oral BID  BM  . heparin  5,000 Units Subcutaneous Q8H  . levothyroxine  75 mcg Oral QAC breakfast  . piperacillin-tazobactam (ZOSYN)  IV  3.375 g Intravenous Q8H  . sodium chloride  1,000 mL Intravenous Once  . vancomycin  1,000 mg Intravenous Q24H   Continuous Infusions: . sodium chloride 50 mL/hr at 04/09/16 0031  . dextrose 5 % and 0.45 % NaCl with KCl 20 mEq/L Stopped (04/05/16 1819)     LOS: 7 days   Time spent: > 35 minutes  Shilee Biggs, MD [facility] Pager [phone]  If 7PM-7AM, please contact night-coverage [email or web address] Password [email or web address] 04/09/2016, 9:03 AM

## 2016-04-09 NOTE — Progress Notes (Addendum)
Spoke with pt. Family, pt. discharge is unknown at this time.

## 2016-04-09 NOTE — Progress Notes (Signed)
Physical Therapy Treatment Patient Details Name: Connor Miller MRN: [id number] DOB: 1922-12-11 Today's Date: 04/09/2016    History of Present Illness 82 yo male admitted with Pna, fall, rhabdomyolysis. Hx of dementia, HTN, CKD    PT Comments    Pt requiring Max assist +2 for mobility on today. Sat EOB for ~5 minutes with repeated LOB. Pt was unable to sit unsupported. Unable to safely attempt standing on today. Also noted pt continues to have difficulty keeping eyes open. Assisted pt back to bed.    Follow Up Recommendations  SNF     Equipment Recommendations       Recommendations for Other Services OT consult     Precautions / Restrictions Precautions Precautions: Fall Restrictions Weight Bearing Restrictions: No    Mobility  Bed Mobility   Bed Mobility: Supine to Sit;Sit to Supine;Rolling Rolling: Mod assist;+2 for physical assistance;+2 for safety/equipment   Supine to sit: Max assist;+2 for physical assistance;+2 for safety/equipment;HOB elevated Sit to supine: Max assist;+2 for physical assistance;+2 for safety/equipment;HOB elevated   General bed mobility comments: Assist for trunk and bil LEs. Increased time. Utilized bedpad for scooting, positioning. Pt requiring significantly more assistance today compared to last session.   Transfers                 General transfer comment: NT-unable to safely attempt. Pt unable to sit at EOB without external support  Ambulation/Gait                 Stairs            Wheelchair Mobility    Modified Rankin (Stroke Patients Only)       Balance Overall balance assessment: Needs assistance Sitting-balance support: Feet supported;Bilateral upper extremity supported Sitting balance-Leahy Scale: Poor Sitting balance - Comments: Multidirectional LOB. Max cueing for pt to use UEs to assist with static sitting balance.                             Cognition Arousal/Alertness:  Awake/alert Behavior During Therapy: WFL for tasks assessed/performed Overall Cognitive Status: History of cognitive impairments - at baseline                      Exercises      General Comments        Pertinent Vitals/Pain Pain Assessment: Faces Faces Pain Scale: Hurts even more Pain Location: with movement of LEs; back with rolling Pain Descriptors / Indicators: Grimacing;Guarding Pain Intervention(s): Limited activity within patient's tolerance;Repositioned    Home Living                      Prior Function            PT Goals (current goals can now be found in the care plan section) Progress towards PT goals: Not progressing toward goals - comment (pt requiring increased assistance on today. unable to progress activity. )    Frequency    Min 3X/week      PT Plan Current plan remains appropriate    Co-evaluation             End of Session   Activity Tolerance: Patient tolerated treatment well Patient left: in bed;with call bell/phone within reach;with bed alarm set;with nursing/sitter in room     Time: [id number] PT Time Calculation (min) (ACUTE ONLY): 15 min  Charges:  $Therapeutic Activity: 8-22 mins  G Codes:      Weston Anna, MPT Pager: [phone]

## 2016-04-09 NOTE — Progress Notes (Signed)
  Echocardiogram 2D Echocardiogram has been performed.  Connor Miller, Connor Miller 04/09/2016, 1:28 PM

## 2016-04-10 ENCOUNTER — Inpatient Hospital Stay (HOSPITAL_COMMUNITY): Payer: Medicare Other

## 2016-04-10 DIAGNOSIS — F0151 Vascular dementia with behavioral disturbance: Secondary | ICD-10-CM

## 2016-04-10 LAB — COMPREHENSIVE METABOLIC PANEL
ALBUMIN: 2.5 g/dL — AB (ref 3.5–5.0)
ALK PHOS: 63 U/L (ref 38–126)
ALT: 34 U/L (ref 17–63)
AST: 29 U/L (ref 15–41)
Anion gap: 7 (ref 5–15)
BILIRUBIN TOTAL: 0.9 mg/dL (ref 0.3–1.2)
BUN: 21 mg/dL — AB (ref 6–20)
CALCIUM: 8 mg/dL — AB (ref 8.9–10.3)
CO2: 27 mmol/L (ref 22–32)
Chloride: 103 mmol/L (ref 101–111)
Creatinine, Ser: 1.03 mg/dL (ref 0.61–1.24)
GFR calc Af Amer: >60 mL/min (ref >60)
GFR calc non Af Amer: >60 mL/min (ref >60)
GLUCOSE: 81 mg/dL (ref 65–99)
Potassium: 3.5 mmol/L (ref 3.5–5.1)
Sodium: 137 mmol/L (ref 135–145)
TOTAL PROTEIN: 4.9 g/dL — AB (ref 6.5–8.1)

## 2016-04-10 LAB — BASIC METABOLIC PANEL
BUN: 21 mg/dL (ref 4–21)
CREATININE: 1 mg/dL (ref 0.6–1.3)
Glucose: 81 mg/dL
POTASSIUM: 3.5 mmol/L (ref 3.4–5.3)
SODIUM: 137 mmol/L (ref 137–147)

## 2016-04-10 LAB — HEPATIC FUNCTION PANEL
ALK PHOS: 63 U/L (ref 25–125)
ALT: 34 U/L (ref 10–40)
AST: 29 U/L (ref 14–40)
Bilirubin, Total: 0.9 mg/dL

## 2016-04-10 MED ORDER — FUROSEMIDE 10 MG/ML IJ SOLN
40.0000 mg | Freq: Two times a day (BID) | INTRAMUSCULAR | Status: AC
Start: 2016-04-10 — End: 2016-04-10
  Administered 2016-04-10 (×2): 40 mg via INTRAVENOUS
  Filled 2016-04-10 (×2): qty 4

## 2016-04-10 MED ORDER — FUROSEMIDE 10 MG/ML IJ SOLN
INTRAMUSCULAR | Status: AC
  Administered 2016-04-10: 20 mg
  Filled 2016-04-10: qty 2

## 2016-04-10 NOTE — Progress Notes (Signed)
LCSWA updated family and [facility] pt. Not yet medically stable.  CSW will continue to follow and assist with patient disposition.

## 2016-04-10 NOTE — Progress Notes (Signed)
PROGRESS NOTE    Connor Miller  WUJ:[id number] DOB: [birth year] DOA: 04/02/2016 PCP: Sanda Linger, MD   Brief Narrative:  82 y.o. male with medical history significant for hypertension, hypothyroidism, B12 deficiency with anemia, and dementia who presents to the emergency department after being found on the floor in his feces and urine. Pt presented with pneumonia likely aspiration, rhabdomyolysis, and elevated troponin with no reported chest pain.  Assessment & Plan:     CAP (community acquired pneumonia) - Pt was on unasyn but chest xray reported worsening in pneumonia. Subsequently switched to broad-spectrum antibiotic regimen, vancomycin and Zosyn, no blood cultures on admission.. Most recent chest x-ray shows parapneumonic effusion. Repeat chest x-ray shows interval decrease in consolidation in the right upper lobe. Persistent moderate-sized bilateral pleural effusions.   . Currently on 2 L of oxygen. Dysphagia 3 diet Will start patient on scheduled doses of Lasix given stable renal function Pro calcitonin 0.43, white blood cell count is normal, patient afebrile overnight Continue Zosyn. Discontinued vancomycin   Acute on chronic diastolic heart failure/Abnormal troponin 0.10>0.11>0.09, likely demand ischemia in the setting of pneumonia/rhabdomyolysis 2-D echo  shows EF of 60-65%, grade 1 diastolic dysfunction Will continue gentle diuresis with IV Lasix    Hypothyroidism - pt currently on synthroid TSH 33.99, free T4 0.55, increase Synthroid to 100 g a day     Essential hypertension, benign - normotensive off blood pressure medication    Failure to thrive in adult - up with assistance PT recommends SNF       Rhabdomyolysis - CK trending down and was due to immobility       Decubitus ulcer,POA :Unstageable pressure injury to sacrum, 4 Stage 2 pressure injuries to the scapulae and mid-thoracic spine    Dementia-stable , needs snf    CKD (chronic kidney disease),  stage III, baseline 1.1 - stable    Macrocytic anemia with vitamin B12 deficiency     Thrombocytopenia (HCC)-resolved    Abnormal LFTs likely due to elevated ck, monitor    DVT prophylaxis: Heparin Code Status: Full Family Communication: None at bedside Disposition Plan: Patient currently not stable for discharge, anticipate discharge in one to 2 days. Will need SNF   Consultants:   none   Procedures: None  Anti-infectives    Start     Dose/Rate Route Frequency Ordered Stop   04/08/16 1200  vancomycin (VANCOCIN) IVPB 1000 mg/200 mL premix  Status:  Discontinued     1,000 mg 200 mL/hr over 60 Minutes Intravenous  Once 04/08/16 0941 04/08/16 0943   04/08/16 1200  vancomycin (VANCOCIN) IVPB 1000 mg/200 mL premix  Status:  Discontinued     1,000 mg 200 mL/hr over 60 Minutes Intravenous Every 24 hours 04/08/16 0943 04/10/16 0927   04/08/16 1000  piperacillin-tazobactam (ZOSYN) IVPB 3.375 g     3.375 g 12.5 mL/hr over 240 Minutes Intravenous Every 8 hours 04/08/16 0926         Subjective: Patient states   his breathing is improved. Denies any chest pain ,shortness of breath. Afebrile overnight    Objective: Vitals:   04/09/16 1903 04/09/16 2213 04/10/16 0437 04/10/16 0500  BP:  128/81 (!) 119/58   Pulse:  80 64   Resp:  18 18   Temp:  98.1 F (36.7 C) 98.7 F (37.1 C)   TempSrc:  Oral Oral   SpO2: 96% 96% 93%   Weight:    70.4 kg (155 lb 3.2 oz)  Height:  Intake/Output Summary (Last 24 hours) at 04/10/16 0928 Last data filed at 04/10/16 0529  Gross per 24 hour  Intake          1726.67 ml  Output             2050 ml  Net          -323.33 ml   Filed Weights   04/07/16 0500 04/08/16 0509 04/10/16 0500  Weight: 78.1 kg (172 lb 2.9 oz) 81.6 kg (180 lb) 70.4 kg (155 lb 3.2 oz)    Examination:  General exam: Appears calm and comfortable, in nad.generalized anasarca Respiratory system: equal chest rise, rhales, no wheezes, decrease breath sounds at  bases Cardiovascular system: S1 & S2 heard, RRR. No JVD, murmurs, rubs, gallops or clicks.   Gastrointestinal system: Abdomen is nondistended, soft and nontender. No organomegaly or masses felt. Normal bowel sounds heard. Central nervous system: moves extremities equally, no facial asymmetry Extremities: Symmetric 5 x 5 power. Skin: + ulcer sacrum 4 stage 2 pressure ulcers to scapulae and midthoracic spine Psychiatry: unable to accurately assess due to limited cooperation with examiner     Data Reviewed: I have personally reviewed following labs and imaging studies  CBC:  Recent Labs Lab 04/06/16 0533 04/09/16 0509  WBC 6.1 8.7  HGB 9.3* 8.9*  HCT 27.3* 25.8*  MCV 103.0* 105.3*  PLT 120* 203   Basic Metabolic Panel:  Recent Labs Lab 04/06/16 0533 04/09/16 0509 04/10/16 0452  NA 138 135 137  K 4.6 4.0 3.5  CL 109 105 103  CO2 23 26 27   GLUCOSE 91 91 81  BUN 27* 22* 21*  CREATININE 0.99 1.08  1.11 1.03  CALCIUM 7.8* 7.9* 8.0*   GFR: Estimated Creatinine Clearance: 46.5 mL/min (by C-G formula based on SCr of 1.03 mg/dL). Liver Function Tests:  Recent Labs Lab 04/10/16 0452  AST 29  ALT 34  ALKPHOS 63  BILITOT 0.9  PROT 4.9*  ALBUMIN 2.5*   No results for input(s): LIPASE, AMYLASE in the last 168 hours. No results for input(s): AMMONIA in the last 168 hours. Coagulation Profile: No results for input(s): INR, PROTIME in the last 168 hours. Cardiac Enzymes:  Recent Labs Lab 04/03/16 1027 04/03/16 1720 04/06/16 0533  CKTOTAL  --   --  574*  TROPONINI 0.09* 0.09*  --    BNP (last 3 results) No results for input(s): PROBNP in the last 8760 hours. HbA1C: No results for input(s): HGBA1C in the last 72 hours. CBG:  Recent Labs Lab 04/05/16 0735 04/06/16 0727 04/08/16 0809 04/08/16 1144 04/09/16 0721  GLUCAP 108* 90 81 117* 89   Lipid Profile: No results for input(s): CHOL, HDL, LDLCALC, TRIG, CHOLHDL, LDLDIRECT in the last 72 hours. Thyroid  Function Tests: No results for input(s): TSH, T4TOTAL, FREET4, T3FREE, THYROIDAB in the last 72 hours. Anemia Panel: No results for input(s): VITAMINB12, FOLATE, FERRITIN, TIBC, IRON, RETICCTPCT in the last 72 hours. Sepsis Labs:  Recent Labs Lab 04/04/16 0553 04/06/16 0533  PROCALCITON 1.06 0.43    No results found for this or any previous visit (from the past 240 hour(s)).       Radiology Studies: No results found.      Scheduled Meds: . chlorhexidine  15 mL Mouth/Throat BID  . collagenase   Topical Daily  . dorzolamide-timolol  1 drop Left Eye BID  . erythromycin   Both Eyes Q6H  . feeding supplement (ENSURE ENLIVE)  237 mL Oral BID BM  . heparin  5,000 Units Subcutaneous Q8H  . levothyroxine  100 mcg Oral QAC breakfast  . piperacillin-tazobactam (ZOSYN)  IV  3.375 g Intravenous Q8H  . sodium chloride  1,000 mL Intravenous Once   Continuous Infusions: . sodium chloride 50 mL/hr at 04/09/16 2206     LOS: 8 days   Time spent: > 35 minutes  Kwaku Mostafa, MD [facility] Pager [phone]  If 7PM-7AM, please contact night-coverage [email or web address] Password [email or web address] 04/10/2016, 9:28 AM

## 2016-04-10 NOTE — Progress Notes (Signed)
SLP Cancellation Note  Patient Details Name: Derry SkillWilliam R Hosking MRN: [id number] DOB: [birth year]   Cancelled treatment:       Reason Eval/Treat Not Completed: Fatigue/lethargy limiting ability to participate   Donavan Burnetamara Yianna Tersigni, MS [facility] S[facility] [phone]

## 2016-04-10 NOTE — Progress Notes (Signed)
Physical Therapy Treatment Patient Details Name: Connor SkillWilliam R Miller MRN: [id number] DOB: [birth year] Today's Date: 04/10/2016    History of Present Illness 82 yo male admitted with Pna, fall, rhabdomyolysis. Hx of dementia, HTN, CKD    PT Comments    Pt c/o feeling dizzy and like he was falling as soon as therapist entered the room. Reassured pt that he was in the bed and that he was not falling. Pt agreed to LE exercises. After completing them, he stated "Im tired". Deferred further activity or any OOB mobility. Continue to recommend SNF.    Follow Up Recommendations  SNF     Equipment Recommendations       Recommendations for Other Services       Precautions / Restrictions Precautions Precautions: Fall Restrictions Weight Bearing Restrictions: No    Mobility  Bed Mobility               General bed mobility comments: NT-pt c/o feeling dizzy and tired while lying down  Transfers                    Ambulation/Gait                 Stairs            Wheelchair Mobility    Modified Rankin (Stroke Patients Only)       Balance                                    Cognition Arousal/Alertness: Lethargic Behavior During Therapy: WFL for tasks assessed/performed Overall Cognitive Status: History of cognitive impairments - at baseline                      Exercises General Exercises - Lower Extremity Ankle Circles/Pumps: AAROM;Both;10 reps;Supine Heel Slides: AAROM;Both;10 reps;Supine Hip ABduction/ADduction: AAROM;Both;10 reps;Supine    General Comments        Pertinent Vitals/Pain Faces Pain Scale: No hurt    Home Living                      Prior Function            PT Goals (current goals can now be found in the care plan section) Progress towards PT goals: Not progressing toward goals - comment (pt unable to mobilize on today due to fatigue)    Frequency    Min 3X/week      PT Plan  Current plan remains appropriate    Co-evaluation             End of Session   Activity Tolerance: Patient limited by fatigue Patient left: in bed;with call bell/phone within reach;with bed alarm set     Time: 1310-1318 PT Time Calculation (min) (ACUTE ONLY): 8 min  Charges:  $Therapeutic Exercise: 8-22 mins                    G Codes:      Rebeca AlertJannie Teaghan [place], MPT Pager: [phone]

## 2016-04-11 ENCOUNTER — Telehealth: Payer: Self-pay | Admitting: *Deleted

## 2016-04-11 DIAGNOSIS — R627 Adult failure to thrive: Secondary | ICD-10-CM

## 2016-04-11 DIAGNOSIS — J189 Pneumonia, unspecified organism: Secondary | ICD-10-CM

## 2016-04-11 DIAGNOSIS — I10 Essential (primary) hypertension: Secondary | ICD-10-CM

## 2016-04-11 LAB — COMPREHENSIVE METABOLIC PANEL
ALK PHOS: 72 U/L (ref 38–126)
ALT: 31 U/L (ref 17–63)
ANION GAP: 8 (ref 5–15)
AST: 24 U/L (ref 15–41)
Albumin: 2.5 g/dL — ABNORMAL LOW (ref 3.5–5.0)
BILIRUBIN TOTAL: 0.8 mg/dL (ref 0.3–1.2)
BUN: 26 mg/dL — ABNORMAL HIGH (ref 6–20)
CALCIUM: 8.2 mg/dL — AB (ref 8.9–10.3)
CO2: 31 mmol/L (ref 22–32)
Chloride: 99 mmol/L — ABNORMAL LOW (ref 101–111)
Creatinine, Ser: 1.26 mg/dL — ABNORMAL HIGH (ref 0.61–1.24)
GFR calc Af Amer: 56 mL/min — ABNORMAL LOW (ref >60)
GFR, EST NON AFRICAN AMERICAN: 48 mL/min — AB (ref >60)
Glucose, Bld: 87 mg/dL (ref 65–99)
POTASSIUM: 3.3 mmol/L — AB (ref 3.5–5.1)
Sodium: 138 mmol/L (ref 135–145)
TOTAL PROTEIN: 5.1 g/dL — AB (ref 6.5–8.1)

## 2016-04-11 LAB — CBC
HEMATOCRIT: 26 % — AB (ref 39.0–52.0)
Hemoglobin: 8.9 g/dL — ABNORMAL LOW (ref 13.0–17.0)
MCH: 35.7 pg — AB (ref 26.0–34.0)
MCHC: 34.2 g/dL (ref 30.0–36.0)
MCV: 104.4 fL — AB (ref 78.0–100.0)
Platelets: 288 10*3/uL (ref 150–400)
RBC: 2.49 MIL/uL — ABNORMAL LOW (ref 4.22–5.81)
RDW: 15.5 % (ref 11.5–15.5)
WBC: 10.3 10*3/uL (ref 4.0–10.5)

## 2016-04-11 LAB — BASIC METABOLIC PANEL
BUN: 26 mg/dL — AB (ref 4–21)
Creatinine: 1.3 mg/dL (ref 0.6–1.3)
GLUCOSE: 87 mg/dL
SODIUM: 138 mmol/L (ref 137–147)

## 2016-04-11 LAB — HEPATIC FUNCTION PANEL: BILIRUBIN, TOTAL: 0.8 mg/dL

## 2016-04-11 LAB — CBC AND DIFFERENTIAL: WBC: 10.3 10^3/mL

## 2016-04-11 LAB — GLUCOSE, CAPILLARY: GLUCOSE-CAPILLARY: 90 mg/dL (ref 65–99)

## 2016-04-11 MED ORDER — AMOXICILLIN-POT CLAVULANATE 875-125 MG PO TABS: 1.0000 | ORAL_TABLET | Freq: Two times a day (BID) | ORAL | 0 refills | Status: DC

## 2016-04-11 MED ORDER — ACETAMINOPHEN 325 MG PO TABS: 650.0000 mg | ORAL_TABLET | Freq: Four times a day (QID) | ORAL | 0 refills | Status: AC | PRN

## 2016-04-11 MED ORDER — COLLAGENASE 250 UNIT/GM EX OINT: TOPICAL_OINTMENT | Freq: Every day | CUTANEOUS | 0 refills | Status: DC

## 2016-04-11 MED ORDER — ENSURE ENLIVE PO LIQD: 237.0000 mL | Freq: Two times a day (BID) | ORAL | 12 refills | Status: AC

## 2016-04-11 MED ORDER — POTASSIUM CHLORIDE CRYS ER 20 MEQ PO TBCR
40.0000 meq | EXTENDED_RELEASE_TABLET | Freq: Once | ORAL | Status: AC
  Administered 2016-04-11: 40 meq via ORAL
  Filled 2016-04-11: qty 2

## 2016-04-11 MED ORDER — IPRATROPIUM-ALBUTEROL 0.5-2.5 (3) MG/3ML IN SOLN: 3.0000 mL | RESPIRATORY_TRACT | 0 refills | Status: AC | PRN

## 2016-04-11 NOTE — Telephone Encounter (Signed)
Pt was on TCM list admitted for pneumonia likely aspiration, rhabdomyolysis, and elevated troponin with no reported chest pain. Pt was D/C 04/11/16 to skill nursing. Pt has not seen Dr. Yetta BarreJones since 03/2015. After d/c from SNF pt need to f/u w/Dr. Yetta BarreJones...Raechel Chute/lmb

## 2016-04-11 NOTE — Progress Notes (Signed)
Speech Language Pathology Treatment: Dysphagia  Patient Details Name: Connor Miller MRN: [id number] DOB: 1922/08/20 Today's Date: 04/11/2016 Time: [id number] SLP Time Calculation (min) (ACUTE ONLY): 10 min  Assessment / Plan / Recommendation Clinical Impression  Pt seen today for dysphagia treatment/ review of compensatory strategies. Pt with persistent throat clear even prior to providing any PO intake, then with consistent throat clear following all PO trials. Note that on MBS pt had habitual throat clear which was not correlated to aspiration. Pt needed frequent verbal cues to take small sips at a time. Recommend continuing current dysphagia 3 diet, thin liquids, meds whole with puree, full supervision during meals to cue small bites/ sips, only provide PO intake if fully alert and upright, reflux precautions. Will continue to follow.   HPI HPI: Pt found down after multiple days.  Pt has degenerative cervical spine disease.  Chest x-ray features hazy perihilar opacities which could reflect CHF, aspiration, or pneumonia per MD note.  PMH + for dementia, anemia, diverticulosis.  US unremarkable except pleural effusion.  Pt was provided with 2 L/m supplemental oxygen and his O2 saturations normalized in ED per notes.  Antibiotics started for suspected community-acquired pneumonia.  Pt admited for aspiration pneumonia, as well as rhabdomyolysis and failure to thrive per ED notes.        SLP Plan  Continue with current plan of care     Recommendations  Diet recommendations: Dysphagia 3 (mechanical soft);Thin liquid Liquids provided via: Cup;Straw Medication Administration: Whole meds with puree Supervision: Full supervision/cueing for compensatory strategies Compensations: Minimize environmental distractions;Slow rate;Small sips/bites;Follow solids with liquid Postural Changes and/or Swallow Maneuvers: Seated upright 90 degrees;Upright 30-60 min after meal                Oral Care  Recommendations: Oral care BID Follow up Recommendations: Skilled Nursing facility Plan: Continue with current plan of care       GO                [place] KungOleksiak, Amy K, MA, CCC-SLP 04/11/2016, 3:11 PM [phone]

## 2016-04-11 NOTE — Discharge Summary (Signed)
Physician Discharge Summary  Connor Miller ZOX:[id number] DOB: 03-02-1923 DOA: 04/02/2016  PCP: Sanda Linger, MD  Admit date: 04/02/2016 Discharge date: 04/11/2016  Recommendations for Outpatient Follow-up:  1. Continue Augmentin for 5 days and discharge 2. Please have palliative care services address goals of care in skilled nursing facility  Discharge Diagnoses:  Principal Problem:   Dementia Active Problems:   Hypothyroidism   Essential hypertension, benign   CAP (community acquired pneumonia)   Failure to thrive in adult   Rhabdomyolysis   Hypoxia   Decubitus ulcer   CKD (chronic kidney disease), stage III   Macrocytic anemia with vitamin B12 deficiency   Thrombocytopenia (HCC)   Acute respiratory failure with hypoxia (HCC)   Pneumonia   Abnormal LFTs    Discharge Condition: stable   Diet recommendation: Dysphasia 3  History of present illness:   Her brief narrative 04/10/2016 "82 y.o.malewith medical history significant for hypertension, hypothyroidism, B12 deficiency with anemia, and dementia who presents to the emergency department after being found on the floor in his feces and urine. Pt presented with pneumonia likely aspiration, rhabdomyolysis, and elevated troponin with no reported chest pain."   Hospital Course:  Lobar pneumonia, unspecified organism - Currently on Zosyn but we'll switch to Augmentin on discharge - May continue oxygen support via nasal cannula to keep oxygen saturation above 90%   Acute on chronic diastolic heart failure/Abnormal troponin - 0.10>0.11>0.09, likely demand ischemia in the setting of pneumonia/rhabdomyolysis - 2-D echo  shows EF of 60-65%, grade 1 diastolic dysfunction - Stable respiratory status    Hypothyroidism - We'll continue current Synthroid dose 75 g daily    Essential hypertension, benign - Would avoid blood pressure medications since patient's systolic blood pressure in low 100    Failure to thrive in  adult - D/C to SNF - If palliative care services can follow the patient in nursing home and address goals of care      Rhabdomyolysis - Due to immobility  - CK trending down    Decubitus ulcer,POA   Unstageable pressure injury to sacrum, 4 Stage 2 pressure injuries to the scapulae and mid-thoracic spine    Dementia   Stable     CKD (chronic kidney disease), stage III, baseline 1.1   Stable     Macrocytic anemia with vitamin B12 deficiency    Thrombocytopenia (HCC)-resolved    Abnormal LFTs likely due to elevated ck, monitor    DVT prophylaxis:  subcutaneous heparin in hospital Code Status: Full Family Communication:  family not at the bedside    Signed:  Manson Passey, MD  [facility] 04/11/2016, 12:38 PM  Pager #: [phone]  Time spent in minutes: less than 30 minutes  Procedures:  None   Consultations:  None  Discharge Exam: Vitals:   04/11/16 0516 04/11/16 1007  BP: 102/60 (!) 103/57  Pulse: 67 65  Resp: 20 16  Temp: 97.8 F (36.6 C) 97.7 F (36.5 C)   Vitals:   04/10/16 2200 04/11/16 0137 04/11/16 0516 04/11/16 1007  BP: 104/85 131/72 102/60 (!) 103/57  Pulse: 78 69 67 65  Resp: 16 20 20 16   Temp: 98.5 F (36.9 C) 98.6 F (37 C) 97.8 F (36.6 C) 97.7 F (36.5 C)  TempSrc: Oral Oral Oral Axillary  SpO2: (!) 88% 95% 94% 92%  Weight:   67.5 kg (148 lb 14.4 oz)   Height:        General: Pt is alert, follows commands appropriately, not in acute distress  Cardiovascular: Regular rate and rhythm, S1/S2 +, no murmurs Respiratory: Clear to auscultation bilaterally, no wheezing, no crackles, no rhonchi Abdominal: Soft, non tender, non distended, bowel sounds +, no guarding Extremities: no edema, no cyanosis, pulses palpable bilaterally DP and PT Neuro: Grossly nonfocal  Discharge Instructions  Discharge Instructions    Call MD for:  persistant nausea and vomiting    Complete by:  As directed    Call MD for:  redness,  tenderness, or signs of infection (pain, swelling, redness, odor or green/yellow discharge around incision site)    Complete by:  As directed    Call MD for:  severe uncontrolled pain    Complete by:  As directed    Diet - low sodium heart healthy    Complete by:  As directed    Discharge instructions    Complete by:  As directed    Continue Augmentin for 5 days on discharge   Increase activity slowly    Complete by:  As directed        Medication List    STOP taking these medications   celecoxib 200 MG capsule Commonly known as:  CELEBREX   triamterene-hydrochlorothiazide 37.5-25 MG capsule Commonly known as:  DYAZIDE     TAKE these medications   acetaminophen 325 MG tablet Commonly known as:  TYLENOL Take 2 tablets (650 mg total) by mouth every 6 (six) hours as needed for mild pain (or Fever >/= 101).   amoxicillin-clavulanate 875-125 MG tablet Commonly known as:  AUGMENTIN Take 1 tablet by mouth 2 (two) times daily.   B-12 IJ Inject as directed every 30 (thirty) days.   collagenase ointment Commonly known as:  SANTYL Apply topically daily. Start taking on:  04/12/2016   dorzolamide-timolol 22.3-6.8 MG/ML ophthalmic solution Commonly known as:  COSOPT Place 1 drop into the left eye 2 (two) times daily.   feeding supplement (ENSURE ENLIVE) Liqd Take 237 mLs by mouth 2 (two) times daily between meals.   ipratropium-albuterol 0.5-2.5 (3) MG/3ML Soln Commonly known as:  DUONEB Take 3 mLs by nebulization every 4 (four) hours as needed.   SYNTHROID 75 MCG tablet Generic drug:  levothyroxine Take 1 tablet (75 mcg total) by mouth daily. Yearly physical is due must see MD for refills       Contact information for follow-up providers    Sanda Linger, MD. Schedule an appointment as soon as possible for a visit in 1 week(s).   Specialty:  Internal Medicine Contact information: [address] [place] [address] [phone]            Contact  information for after-discharge care    Destination    [facility] .   Specialty:  Skilled Nursing Facility Contact information: [address] [place] [address] [phone]                   The results of significant diagnostics from this hospitalization (including imaging, microbiology, ancillary and laboratory) are listed below for reference.    Significant Diagnostic Studies: Dg Chest 2 View  Result Date: 04/10/2016 CLINICAL DATA:  Follow-up pneumonia. Persistent shortness of breath. EXAM: CHEST  2 VIEW COMPARISON:  Portable chest x-ray of April 08, 2016 FINDINGS: There is persistent consolidation in the right upper lobe which has become slightly less dense. Density noted elsewhere in the lungs is fairly stable and may in part be due to layering of pleural fluid posteriorly, bilaterally. There remains  blunting of the costophrenic angles. The retrocardiac region remains dense. The heart is normal in size. The pulmonary vascularity is not clearly engorged. There is calcification in the wall of the thoracic aorta. The observed bony thorax exhibits no acute abnormality. IMPRESSION: Slight interval decrease in the consolidation in the right upper lobe. Persistent moderate-sized bilateral pleural effusions layering inferiorly and posteriorly. No overt CHF. Aortic atherosclerosis. Electronically Signed   By: David  SwazilandJordan M.D.   On: 04/10/2016 10:57   Dg Chest 2 View  Result Date: 04/02/2016 CLINICAL DATA:  Unwitnessed fall EXAM: CHEST  2 VIEW COMPARISON:  02/27/2015 chest radiograph. FINDINGS: Left rotated chest radiograph. Stable cardiomediastinal silhouette with borderline mild cardiomegaly and aortic atherosclerosis. No pneumothorax. Trace bilateral pleural effusions. There are hazy bilateral parahilar opacities, most prominent in the right upper lung. No displaced fractures. IMPRESSION: Hazy parahilar lung opacities, most prominent in the  right upper lung, potentially representing mild pulmonary edema given the borderline cardiomegaly and trace bilateral pleural effusions, with the differential including infection or aspiration. Recommend follow-up PA and lateral post treatment chest radiographs in 4-6 weeks. Electronically Signed   By: Delbert PhenixJason A Poff M.D.   On: 04/02/2016 17:16   Ct Head Wo Contrast  Result Date: 04/02/2016 CLINICAL DATA:  Unwitnessed fall. EXAM: CT HEAD WITHOUT CONTRAST CT CERVICAL SPINE WITHOUT CONTRAST TECHNIQUE: Multidetector CT imaging of the head and cervical spine was performed following the standard protocol without intravenous contrast. Multiplanar CT image reconstructions of the cervical spine were also generated. COMPARISON:  02/27/2015 head and cervical spine CT. FINDINGS: CT HEAD FINDINGS Brain: No evidence of parenchymal hemorrhage or extra-axial fluid collection. No mass lesion, mass effect, or midline shift. No CT evidence of acute infarction. Intracranial atherosclerosis. Nonspecific moderate subcortical and periventricular white matter hypodensity, most in keeping with chronic small vessel ischemic change. Generalized cerebral volume loss. Cerebral ventricle sizes are stable and concordant with the degree of cerebral volume loss. Vascular: No hyperdense vessel or unexpected calcification. Skull: No evidence of calvarial fracture. Bilateral scleral banding. Sinuses/Orbits: The visualized paranasal sinuses are essentially clear. Other:  The mastoid air cells are unopacified. CT CERVICAL SPINE FINDINGS Alignment: Minimal 2 mm anterolisthesis at C4-5 is stable. Mild 3 mm anterolisthesis at C7-T1 is stable. No acute subluxation. Skull base and vertebrae: No acute fracture. No primary bone lesion or focal pathologic process. Soft tissues and spinal canal: No prevertebral fluid or swelling. No visible canal hematoma. Disc levels: Severe degenerative disc disease throughout the cervical spine, most prominent at C6-7,  noting stable ankylosis at C3-4. Severe bilateral facet arthropathy. Moderate degenerative foraminal stenosis on the right at C2-3. Mild degenerative foraminal stenosis bilaterally at C3-4. Mild degenerative foraminal stenosis on the right at C4-5 and C5-6. Upper chest: Stable pleural-parenchymal scarring at the right greater than left lung apices. Other: Stable calcified 1.0 cm nodule at the superior right side lobe. IMPRESSION: 1. No evidence of acute intracranial abnormality. No evidence of calvarial fracture . 2. Moderate chronic small vessel ischemia and generalized cerebral volume loss. 3. No fracture or acute malalignment in the cervical spine . 4. Severe degenerative changes in the cervical spine as described. Electronically Signed   By: Delbert PhenixJason A Poff M.D.   On: 04/02/2016 17:37   Ct Cervical Spine Wo Contrast  Result Date: 04/02/2016 CLINICAL DATA:  Unwitnessed fall. EXAM: CT HEAD WITHOUT CONTRAST CT CERVICAL SPINE WITHOUT CONTRAST TECHNIQUE: Multidetector CT imaging of the head and cervical spine was performed following the standard protocol without intravenous contrast. Multiplanar CT image  reconstructions of the cervical spine were also generated. COMPARISON:  02/27/2015 head and cervical spine CT. FINDINGS: CT HEAD FINDINGS Brain: No evidence of parenchymal hemorrhage or extra-axial fluid collection. No mass lesion, mass effect, or midline shift. No CT evidence of acute infarction. Intracranial atherosclerosis. Nonspecific moderate subcortical and periventricular white matter hypodensity, most in keeping with chronic small vessel ischemic change. Generalized cerebral volume loss. Cerebral ventricle sizes are stable and concordant with the degree of cerebral volume loss. Vascular: No hyperdense vessel or unexpected calcification. Skull: No evidence of calvarial fracture. Bilateral scleral banding. Sinuses/Orbits: The visualized paranasal sinuses are essentially clear. Other:  The mastoid air cells  are unopacified. CT CERVICAL SPINE FINDINGS Alignment: Minimal 2 mm anterolisthesis at C4-5 is stable. Mild 3 mm anterolisthesis at C7-T1 is stable. No acute subluxation. Skull base and vertebrae: No acute fracture. No primary bone lesion or focal pathologic process. Soft tissues and spinal canal: No prevertebral fluid or swelling. No visible canal hematoma. Disc levels: Severe degenerative disc disease throughout the cervical spine, most prominent at C6-7, noting stable ankylosis at C3-4. Severe bilateral facet arthropathy. Moderate degenerative foraminal stenosis on the right at C2-3. Mild degenerative foraminal stenosis bilaterally at C3-4. Mild degenerative foraminal stenosis on the right at C4-5 and C5-6. Upper chest: Stable pleural-parenchymal scarring at the right greater than left lung apices. Other: Stable calcified 1.0 cm nodule at the superior right side lobe. IMPRESSION: 1. No evidence of acute intracranial abnormality. No evidence of calvarial fracture . 2. Moderate chronic small vessel ischemia and generalized cerebral volume loss. 3. No fracture or acute malalignment in the cervical spine . 4. Severe degenerative changes in the cervical spine as described. Electronically Signed   By: Delbert PhenixJason A Poff M.D.   On: 04/02/2016 17:37   Dg Chest Port 1 View  Result Date: 04/08/2016 CLINICAL DATA:  Wheezing and shortness breath EXAM: PORTABLE CHEST 1 VIEW COMPARISON:  04/02/2016 FINDINGS: The worsened right upper lobe consolidation and volume loss. New consolidation in the left lower lobe. New pleural effusions, right greater than left. IMPRESSION: Multifocal consolidation, worsening. New/enlarging pleural effusions. This most likely represents multifocal pneumonia with parapneumonic effusions. Electronically Signed   By: Ellery Plunkaniel R Mitchell M.D.   On: 04/08/2016 06:32   Dg Swallowing Func-speech Pathology  Result Date: 04/04/2016 Objective Swallowing Evaluation: Type of Study: MBS-Modified Barium Swallow  Study Patient Details Name: Derry SkillWilliam R Daughtrey MRN: [id number] Date of Birth: Oct 12, 1922 Today's Date: 04/04/2016 Time: SLP Start Time (ACUTE ONLY): 1101-SLP Stop Time (ACUTE ONLY): 1140 SLP Time Calculation (min) (ACUTE ONLY): 39 min Past Medical History: Past Medical History: Diagnosis Date . Anemia   NOS . BPH (benign prostatic hyperplasia)  . Colon polyps  . Dementia  . Diverticulosis  . Hypertension  . Hypothyroidism  Past Surgical History: Past Surgical History: Procedure Laterality Date . COLONOSCOPY    multiple . EYE SURGERY   . HEMORRHOID SURGERY  1972 . INGUINAL HERNIA REPAIR  04/1995 . VITRECTOMY  07/1990  for macular hole HPI: Pt found down after multiple days.  Pt has degenerative cervical spine disease.  Chest x-ray features hazy perihilar opacities which could reflect CHF, aspiration, or pneumonia per MD note.  PMH + for dementia, anemia, diverticulosis.  US unremarkable except pleural effusion.  Pt was provided with 2 L/m supplemental oxygen and his O2 saturations normalized in ED per notes.  Antibiotics started for suspected community-acquired pneumonia.  Pt admited for aspiration pneumonia, as well as rhabdomyolysis and failure to thrive per ED notes.  Subjective: pt alert Assessment / Plan / Recommendation CHL IP CLINICAL IMPRESSIONS 04/04/2016 Therapy Diagnosis Mild oral phase dysphagia;Suspected primary esophageal dysphagia Clinical Impression Pt with suprisingly only mild oropharyngeal dysphagia without aspiration and trace laryngeal penetration of thin cleared with reflexive throat clearing.  Oropharyngeal swallow was strong with only minimal residuals. Cued dry swallows helpful to decrease residuals.  Mr Reister did appear with delayed clearance of the distal esophagus of puree/tablet without sensation.  He required multiple boluses of thin liquid to clear into stomach. Radiologist not present to confirm.   Suspect this may be primary area of difficulty/dysphagia.    Impact on safety and function Mild  aspiration risk   No flowsheet data found.  Prognosis 04/04/2016 Prognosis for Safe Diet Advancement Fair Barriers to Reach Goals -- Barriers/Prognosis Comment -- CHL IP DIET RECOMMENDATION 04/04/2016 SLP Diet Recommendations Thin liquid Liquid Administration via Straw Medication Administration Crushed with puree Compensations Slow rate;Small sips/bites;Follow solids with liquid Postural Changes Seated upright at 90 degrees;Remain semi-upright after after feeds/meals (Comment)   CHL IP OTHER RECOMMENDATIONS 04/04/2016 Recommended Consults -- Oral Care Recommendations Oral care BID Other Recommendations Have oral suction available   CHL IP FOLLOW UP RECOMMENDATIONS 04/04/2016 Follow up Recommendations (No Data)   CHL IP FREQUENCY AND DURATION 04/04/2016 Speech Therapy Frequency (ACUTE ONLY) min 2x/week Treatment Duration 1 week      CHL IP ORAL PHASE 04/04/2016 Oral Phase Impaired Oral - Pudding Teaspoon -- Oral - Pudding Cup -- Oral - Honey Teaspoon -- Oral - Honey Cup -- Oral - Nectar Teaspoon -- Oral - Nectar Cup -- Oral - Nectar Straw Weak lingual manipulation;Premature spillage;Lingual/palatal residue Oral - Thin Teaspoon Weak lingual manipulation;Premature spillage;Lingual/palatal residue Oral - Thin Cup -- Oral - Thin Straw Weak lingual manipulation;Premature spillage Oral - Puree Weak lingual manipulation;Premature spillage Oral - Mech Soft Weak lingual manipulation;Premature spillage Oral - Regular -- Oral - Multi-Consistency -- Oral - Pill Weak lingual manipulation;Premature spillage Oral Phase - Comment trace oral residuals noted  CHL IP PHARYNGEAL PHASE 04/04/2016 Pharyngeal Phase Impaired Pharyngeal- Pudding Teaspoon -- Pharyngeal -- Pharyngeal- Pudding Cup -- Pharyngeal -- Pharyngeal- Honey Teaspoon -- Pharyngeal -- Pharyngeal- Honey Cup -- Pharyngeal -- Pharyngeal- Nectar Teaspoon -- Pharyngeal -- Pharyngeal- Nectar Cup -- Pharyngeal -- Pharyngeal- Nectar Straw Pharyngeal residue -  valleculae;Pharyngeal residue - pyriform Pharyngeal -- Pharyngeal- Thin Teaspoon Pharyngeal residue - valleculae;WFL;Pharyngeal residue - pyriform Pharyngeal -- Pharyngeal- Thin Cup NT;Pharyngeal residue - valleculae;Pharyngeal residue - pyriform Pharyngeal -- Pharyngeal- Thin Straw Penetration/Aspiration during swallow Pharyngeal Material enters airway, CONTACTS cords and then ejected out Pharyngeal- Puree WFL Pharyngeal -- Pharyngeal- Mechanical Soft WFL Pharyngeal -- Pharyngeal- Regular -- Pharyngeal -- Pharyngeal- Multi-consistency -- Pharyngeal -- Pharyngeal- Pill WFL Pharyngeal -- Pharyngeal Comment --  CHL IP CERVICAL ESOPHAGEAL PHASE 04/04/2016 Cervical Esophageal Phase WFL Pudding Teaspoon -- Pudding Cup -- Honey Teaspoon -- Honey Cup -- Nectar Teaspoon -- Nectar Cup -- Nectar Straw -- Thin Teaspoon -- Thin Cup -- Thin Straw -- Puree -- Mechanical Soft -- Regular -- Multi-consistency -- Pill -- Cervical Esophageal Comment -- No flowsheet data found. [name], MS [facility] SLP [phone]              US Abdomen Limited Ruq  Result Date: 04/03/2016 CLINICAL DATA:  Abnormal LFTs EXAM: US ABDOMEN LIMITED - RIGHT UPPER QUADRANT COMPARISON:  10/16/2006 CT abdomen with contrast FINDINGS: Gallbladder: Limited visualization because of upper abdominal obscuring bowel gas. No definite gallstones, wall thickening or pericholecystic fluid. Wall thickness measures 2 mm.  No Murphy's sign. Common bile duct: Diameter: 1.7 mm. Liver: Also limited because of overlying obscuring bowel gas. No definite focal abnormality or intrahepatic biliary dilatation. Patent hepatic and portal veins with normal directional flow. Small right effusion noted during the exam. IMPRESSION: Limited exam because of bowel gas but no definite acute finding by ultrasound. No biliary dilatation Small right pleural effusion Electronically Signed   By: Judie Petit.  Shick M.D.   On: 04/03/2016 08:38    Microbiology: No results found for this or any previous  visit (from the past 240 hour(s)).   Labs: Basic Metabolic Panel:  Recent Labs Lab 04/06/16 0533 04/09/16 0509 04/10/16 0452 04/11/16 0509  NA 138 135 137 138  K 4.6 4.0 3.5 3.3*  CL 109 105 103 99*  CO2 23 26 27 31   GLUCOSE 91 91 81 87  BUN 27* 22* 21* 26*  CREATININE 0.99 1.08  1.11 1.03 1.26*  CALCIUM 7.8* 7.9* 8.0* 8.2*   Liver Function Tests:  Recent Labs Lab 04/10/16 0452 04/11/16 0509  AST 29 24  ALT 34 31  ALKPHOS 63 72  BILITOT 0.9 0.8  PROT 4.9* 5.1*  ALBUMIN 2.5* 2.5*   No results for input(s): LIPASE, AMYLASE in the last 168 hours. No results for input(s): AMMONIA in the last 168 hours. CBC:  Recent Labs Lab 04/06/16 0533 04/09/16 0509 04/11/16 0509  WBC 6.1 8.7 10.3  HGB 9.3* 8.9* 8.9*  HCT 27.3* 25.8* 26.0*  MCV 103.0* 105.3* 104.4*  PLT 120* 203 288   Cardiac Enzymes:  Recent Labs Lab 04/06/16 0533  CKTOTAL 574*   BNP: BNP (last 3 results)  Recent Labs  04/02/16 1640  BNP 205.6*    ProBNP (last 3 results) No results for input(s): PROBNP in the last 8760 hours.  CBG:  Recent Labs Lab 04/06/16 0727 04/08/16 0809 04/08/16 1144 04/09/16 0721 04/11/16 0738  GLUCAP 90 81 117* 89 90

## 2016-04-11 NOTE — Clinical Social Work Placement (Signed)
Medical Social Worker facilitated patient discharge including contacting patient family and facility to confirm patient discharge plans.  Clinical information faxed to facility and family agreeable with plan.  MSW arranged ambulance transport via [facility] to [facility] .  RN to call report prior to discharge.  Medical Social Worker will sign off for now as social work intervention is no longer needed. Please consult us again if new need arises.   CLINICAL SOCIAL WORK PLACEMENT  NOTE  Date:  04/11/2016  Patient Details  Name: Derry SkillWilliam R Gundy MRN: [id number] Date of Birth: [birth year]  Clinical Social Work is seeking post-discharge placement for this patient at the Skilled  Nursing Facility level of care (*CSW will initial, date and re-position this form in  chart as items are completed):  Yes   Patient/family provided with [place] Clinical Social Work Department's list of facilities offering this level of care within the geographic area requested by the patient (or if unable, by the patient's family).  Yes   Patient/family informed of their freedom to choose among providers that offer the needed level of care, that participate in Medicare, Medicaid or managed care program needed by the patient, have an available bed and are willing to accept the patient.  Yes   Patient/family informed of [facility]'s ownership interest in [facility] and [facility], as well as of the fact that they are under no obligation to receive care at these facilities.  PASRR submitted to EDS on 04/06/16     PASRR number received on 04/06/16     Existing PASRR number confirmed on       FL2 transmitted to all facilities in geographic area requested by pt/family on       FL2 transmitted to all facilities within larger geographic area on       Patient informed that his/her managed care company has contracts with or will negotiate with certain facilities, including the following:        Yes    Patient/family informed of bed offers received.  Patient chooses bed at  [facility] )     Physician recommends and patient chooses bed at      Patient to be transferred to  [facility] ) on 04/11/16.  Patient to be transferred to facility by  [facility] )     Patient family notified on 04/11/16 of transfer.  Name of family member notified:   (Pt's cousin-in-law, Allyson SabalBerry )     PHYSICIAN Please sign FL2, Please prepare priority discharge summary, including medications     Additional Comment:    _______________________________________________ Derenda FennelNixon, Tammy Ericsson A 04/11/2016, 1:10 PM

## 2016-04-11 NOTE — Discharge Instructions (Signed)
Amoxicillin; Clavulanic Acid tablets  What is this medicine?  AMOXICILLIN; CLAVULANIC ACID (a mox i SIL in; KLAV yoo lan ic AS id) is a penicillin antibiotic. It is used to treat certain kinds of bacterial infections. It will not work for colds, flu, or other viral infections.  This medicine may be used for other purposes; ask your health care provider or pharmacist if you have questions.  What should I tell my health care provider before I take this medicine?  They need to know if you have any of these conditions:  -bowel disease, like colitis  -kidney disease  -liver disease  -mononucleosis  -an unusual or allergic reaction to amoxicillin, penicillin, cephalosporin, other antibiotics, clavulanic acid, other medicines, foods, dyes, or preservatives  -pregnant or trying to get pregnant  -breast-feeding  How should I use this medicine?  Take this medicine by mouth with a full glass of water. Follow the directions on the prescription label. Take at the start of a meal. Do not crush or chew. If the tablet has a score line, you may cut it in half at the score line for easier swallowing. Take your medicine at regular intervals. Do not take your medicine more often than directed. Take all of your medicine as directed even if you think you are better. Do not skip doses or stop your medicine early.  Talk to your pediatrician regarding the use of this medicine in children. Special care may be needed.  Overdosage: If you think you have taken too much of this medicine contact a poison control center or emergency room at once.  NOTE: This medicine is only for you. Do not share this medicine with others.  What if I miss a dose?  If you miss a dose, take it as soon as you can. If it is almost time for your next dose, take only that dose. Do not take double or extra doses.  What may interact with this medicine?  -allopurinol  -anticoagulants  -birth control pills  -methotrexate  -probenecid  This list may not describe all possible  interactions. Give your health care provider a list of all the medicines, herbs, non-prescription drugs, or dietary supplements you use. Also tell them if you smoke, drink alcohol, or use illegal drugs. Some items may interact with your medicine.  What should I watch for while using this medicine?  Tell your doctor or health care professional if your symptoms do not improve.  Do not treat diarrhea with over the counter products. Contact your doctor if you have diarrhea that lasts more than 2 days or if it is severe and watery.  If you have diabetes, you may get a false-positive result for sugar in your urine. Check with your doctor or health care professional.  Birth control pills may not work properly while you are taking this medicine. Talk to your doctor about using an extra method of birth control.  What side effects may I notice from receiving this medicine?  Side effects that you should report to your doctor or health care professional as soon as possible:  -allergic reactions like skin rash, itching or hives, swelling of the face, lips, or tongue  -breathing problems  -dark urine  -fever or chills, sore throat  -redness, blistering, peeling or loosening of the skin, including inside the mouth  -seizures  -trouble passing urine or change in the amount of urine  -unusual bleeding, bruising  -unusually weak or tired  -white patches or sores in the mouth   or throat  Side effects that usually do not require medical attention (report to your doctor or health care professional if they continue or are bothersome):  -diarrhea  -dizziness  -headache  -nausea, vomiting  -stomach upset  -vaginal or anal irritation  This list may not describe all possible side effects. Call your doctor for medical advice about side effects. You may report side effects to FDA at 1-800-FDA-1088.  Where should I keep my medicine?  Keep out of the reach of children.  Store at room temperature below 25 degrees C (77 degrees F). Keep container  tightly closed. Throw away any unused medicine after the expiration date.  NOTE: This sheet is a summary. It may not cover all possible information. If you have questions about this medicine, talk to your doctor, pharmacist, or health care provider.     © 2016, [employer]vier/[employer]. (2007-08-19 12:04:30)

## 2016-04-11 NOTE — Care Management Important Message (Signed)
Important Message  Patient Details  Name: Derry SkillWilliam R Alridge MRN: [id number] Date of Birth: [birth year]   Medicare Important Message Given:  Yes    Haskell FlirtJamison, Hadessah Grennan 04/11/2016, 10:52 AMImportant Message  Patient Details  Name: Derry SkillWilliam R Kearl MRN: [id number] Date of Birth: [birth year]   Medicare Important Message Given:  Yes    Haskell FlirtJamison, Abdirahim Flavell 04/11/2016, 10:52 AM

## 2016-04-11 NOTE — Progress Notes (Signed)
Pharmacy Antibiotic Note  Connor Miller is a 82 y.o. male admitted on 04/02/2016 with pneumonia, thought likely secondary to aspiration .  Patient has been treated with Unasyn since admission but today's CXR shows worsening, now with multifocal consolidation.   Empiric antibiotic regimen is therefore being changed to Zosyn + vancomycin and pharmacy has been consulted for dosing. Vancomycin has subsequently been dc'ed.  Plan: 1. Zosyn 3.375 grams IV q8h (extended-infusion) 2. Follow serum creatinine, culture results, clinical course.   Check vancomycin trough unless de-escalation feasible.  Dosage remains stable at above dose and need for further dosage adjustment appears unlikely at present.    Will sign off at this time.  Please reconsult if a change in clinical status warrants re-evaluation of dosage.    Height: 5\' 10"  (177.8 cm) Weight: 148 lb 14.4 oz (67.5 kg) IBW/kg (Calculated) : 73  Temp (24hrs), Avg:98.3 F (36.8 C), Min:97.7 F (36.5 C), Max:98.9 F (37.2 C)   Recent Labs Lab 04/06/16 0533 04/09/16 0509 04/10/16 0452 04/11/16 0509  WBC 6.1 8.7  --  10.3  CREATININE 0.99 1.08  1.11 1.03 1.26*    Estimated Creatinine Clearance: 36.5 mL/min (by C-G formula based on SCr of 1.26 mg/dL (H)).    No Known Allergies  Antimicrobials this admission: 10/25 Ceftriaxone x1 10/25 Unasyn >>10/31 10/31 Zosyn  >> 10/31 vancomycin >> 11/2   Microbiology results: 10/25 Urine strep Ag: neg 10/25 Urine legionella Ag: neg   Thank you for allowing pharmacy to be a part of this patient's care.   Adalberto ColeNikola Mckell Riecke, PharmD, BCPS Pager [phone] 04/11/2016 10:55 AM

## 2016-04-14 ENCOUNTER — Encounter: Payer: Self-pay | Admitting: Internal Medicine

## 2016-04-14 ENCOUNTER — Non-Acute Institutional Stay (SKILLED_NURSING_FACILITY): Payer: Medicare Other | Admitting: Internal Medicine

## 2016-04-14 DIAGNOSIS — D519 Vitamin B12 deficiency anemia, unspecified: Secondary | ICD-10-CM

## 2016-04-14 DIAGNOSIS — R7989 Other specified abnormal findings of blood chemistry: Secondary | ICD-10-CM | POA: Diagnosis not present

## 2016-04-14 DIAGNOSIS — F0151 Vascular dementia with behavioral disturbance: Secondary | ICD-10-CM | POA: Diagnosis not present

## 2016-04-14 DIAGNOSIS — L89150 Pressure ulcer of sacral region, unstageable: Secondary | ICD-10-CM

## 2016-04-14 DIAGNOSIS — M6282 Rhabdomyolysis: Secondary | ICD-10-CM | POA: Diagnosis not present

## 2016-04-14 DIAGNOSIS — L89102 Pressure ulcer of unspecified part of back, stage 2: Secondary | ICD-10-CM

## 2016-04-14 DIAGNOSIS — J690 Pneumonitis due to inhalation of food and vomit: Secondary | ICD-10-CM

## 2016-04-14 DIAGNOSIS — F01518 Vascular dementia, unspecified severity, with other behavioral disturbance: Secondary | ICD-10-CM

## 2016-04-14 DIAGNOSIS — D518 Other vitamin B12 deficiency anemias: Secondary | ICD-10-CM

## 2016-04-14 DIAGNOSIS — E034 Atrophy of thyroid (acquired): Secondary | ICD-10-CM | POA: Diagnosis not present

## 2016-04-14 DIAGNOSIS — N179 Acute kidney failure, unspecified: Secondary | ICD-10-CM

## 2016-04-14 DIAGNOSIS — I10 Essential (primary) hypertension: Secondary | ICD-10-CM

## 2016-04-14 DIAGNOSIS — N183 Chronic kidney disease, stage 3 unspecified: Secondary | ICD-10-CM

## 2016-04-14 DIAGNOSIS — J9601 Acute respiratory failure with hypoxia: Secondary | ICD-10-CM | POA: Diagnosis not present

## 2016-04-14 DIAGNOSIS — R627 Adult failure to thrive: Secondary | ICD-10-CM

## 2016-04-14 DIAGNOSIS — R945 Abnormal results of liver function studies: Secondary | ICD-10-CM

## 2016-04-14 NOTE — Progress Notes (Signed)
: Provider:  Randon GoldsmithAnne D. Lyn HollingsheadAlexander, MD Location:  [facility] Room Number: 112P Place of Service:  SNF ([phone])  PCP: Sanda Lingerhomas Jones, MD Patient Care Team: Etta Grandchildhomas L Jones, MD as PCP - General (Internal Medicine)  Extended Emergency Contact Information Primary Emergency Contact: Erline LevineShaw,Helen E Address: [address]          [place], [place] [address] [place] Home Phone: [phone] Relation: Spouse Secondary Emergency Contact: Cindie LarocheWeaver,Betty  [place] Home Phone: [phone] Relation: Relative     Allergies: Patient has no known allergies.  Chief Complaint  Patient presents with  . New Admit To SNF    Admit to Facility    HPI: Patient is 82 y.o. male with hypertension, hypothyroidism, B12 deficiency with anemia, and dementia who presents to the emergency department after being found on the floor in his feces and urine. [facility] where pt as dx with PNA, proabably aspiration and rhabdomyolysis.PNA was treated with zosyn and O2 by [place] and CK improved with IVF. LFT's were noted to be elevated, felt 2/2 rhabdo and decubitus ulcers on sacrum and back were discovered.Pt is admitted to SNF for generalized weakness for OT/PT. While at SNF pt will be followed for hypothyroidism, tx with synthroid, b12 def, tx with supplementation and HTN, tx with monitoring and meds prn.  Past Medical History:  Diagnosis Date  . Absolute anemia   . Anemia    NOS  . Atherosclerosis of native arteries of extremities with intermittent claudication, unspecified extremity (HCC) 01/20/2013  . BPH (benign prostatic hyperplasia) 05/07/2011  . CAP (community acquired pneumonia) 05/28/2011  . Colon polyps   . Complete rotator cuff tear of left shoulder 03/31/2013  . Constipation 06/18/2011  . Dementia   . Dermatitis, atopic 08/05/2012  . Diverticulosis   . Essential hypertension, benign 08/05/2010  . Failure to thrive in adult  02/14/2014  . Hypertension   . Hypothyroidism 08/05/2010  . Lumbago 02/08/2015  . OAB (overactive bladder) 03/24/2012  . Post-traumatic wound infection 03/20/2015    Past Surgical History:  Procedure Laterality Date  . COLONOSCOPY     multiple  . EYE SURGERY    . HEMORRHOID SURGERY  1972  . INGUINAL HERNIA REPAIR  04/1995  . VITRECTOMY  07/1990   for macular hole      Medication List       Accurate as of 04/14/16  9:59 AM. Always use your most recent med list.          acetaminophen 325 MG tablet Commonly known as:  TYLENOL Take 2 tablets (650 mg total) by mouth every 6 (six) hours as needed for mild pain (or Fever >/= 101).   amoxicillin-clavulanate 875-125 MG tablet Commonly known as:  AUGMENTIN Take 1 tablet by mouth 2 (two) times daily.   B-12 IJ Inject as directed every 30 (thirty) days.   collagenase ointment Commonly known as:  SANTYL Apply topically daily.   dorzolamide-timolol 22.3-6.8 MG/ML ophthalmic solution Commonly known as:  COSOPT Place 1 drop into the left eye 2 (two) times daily.   feeding supplement (ENSURE ENLIVE) Liqd Take 237 mLs by mouth 2 (two) times daily between meals.   ipratropium-albuterol 0.5-2.5 (3) MG/3ML Soln Commonly known as:  DUONEB Take 3 mLs by nebulization every 4 (four) hours as needed.   SYNTHROID 75 MCG tablet Generic drug:  levothyroxine Take 1 tablet (75 mcg total) by mouth daily. Yearly physical is  due must see MD for refills       No orders of the defined types were placed in this encounter.   Immunization History  Administered Date(s) Administered  . Influenza Split 03/19/2011, 03/03/2012  . Influenza Whole 03/14/2010  . Influenza, High Dose Seasonal PF 03/24/2013, 02/08/2015  . Influenza,inj,Quad PF,36+ Mos 02/08/2014  . PPD Test 04/11/2016  . Pneumococcal Conjugate-13 03/24/2013  . Pneumococcal Polysaccharide-23 03/10/2007  . Td 09/08/2007  . Tdap 07/09/2011, 02/27/2015  . Zoster 12/03/2012     Social History  Substance Use Topics  . Smoking status: Never Smoker  . Smokeless tobacco: Never Used     Comment: "few cigarettes when young"  . Alcohol use No     Comment: occasionally,     Family history is   Family History  Problem Relation Age of Onset  . Lung cancer Father   . Lung cancer Brother   . Other Mother     aorta ruptured      Review of Systems  DATA OBTAINED: from nurse GENERAL:  no fevers, fatigue, appetite changes SKIN: No itching, or rash EYES: No eye pain, redness, discharge EARS: No earache, tinnitus, change in hearing NOSE: No congestion, drainage or bleeding  MOUTH/THROAT: No mouth or tooth pain, No sore throat RESPIRATORY: No cough, wheezing, SOB CARDIAC: No chest pain, palpitations, lower extremity edema  GI: No abdominal pain, No N/V/D or constipation, No heartburn or reflux  GU: No dysuria, frequency or urgency, or incontinence  MUSCULOSKELETAL: No unrelieved bone/joint pain NEUROLOGIC: No headache, dizziness or focal weakness PSYCHIATRIC: No c/o anxiety or sadness   Vitals:   04/14/16 0923  BP: 128/78  Pulse: 70  Resp: 20  Temp: 97.3 F (36.3 C)    SpO2 Readings from Last 1 Encounters:  04/14/16 94%   Body mass index is 18.79 kg/m.     Physical Exam  GENERAL APPEARANCE: Alert, mod conversant,  No acute distress.  SKIN: No diaphoresis rash HEAD: Normocephalic, atraumatic  EYES: Conjunctiva/lids clear. Pupils round, reactive. EOMs intact.  EARS: External exam WNL, canals clear. Hearing grossly normal.  NOSE: No deformity or discharge.  MOUTH/THROAT: Lips w/o lesions  RESPIRATORY: Breathing is even, unlabored. Lung sounds are clear   CARDIOVASCULAR: Heart RRR no murmurs, rubs or gallops. No peripheral edema.   GASTROINTESTINAL: Abdomen is soft, non-tender, not distended w/ normal bowel sounds. GENITOURINARY: Bladder non tender, not distended  MUSCULOSKELETAL: No abnormal joints or musculature NEUROLOGIC:  Cranial  nerves 2-12 grossly intact. Moves all extremities  PSYCHIATRIC: Mood and affect appropriate to situation with dementia, no behavioral issues  Patient Active Problem List   Diagnosis Date Noted  . Rhabdomyolysis 04/02/2016  . Hypoxia 04/02/2016  . Decubitus ulcer 04/02/2016  . Dementia 04/02/2016  . CKD (chronic kidney disease), stage III 04/02/2016  . Macrocytic anemia with vitamin B12 deficiency 04/02/2016  . Thrombocytopenia (HCC) 04/02/2016  . Acute respiratory failure with hypoxia (HCC) 04/02/2016  . Pneumonia 04/02/2016  . Abnormal LFTs 04/02/2016  . AKI (acute kidney injury) (HCC)   . Aspiration pneumonia (HCC)   . Non-traumatic rhabdomyolysis   . Absolute anemia   . Post-traumatic wound infection 03/20/2015  . Lumbago 02/08/2015  . Hip pain, bilateral 02/08/2015  . Failure to thrive in adult 02/14/2014  . Complete rotator cuff tear of left shoulder 03/31/2013  . Atherosclerosis of native arteries of the extremities with intermittent claudication 01/20/2013  . Dermatitis, atopic 08/05/2012  . OAB (overactive bladder) 03/24/2012  . Constipation 06/18/2011  . CAP (  community acquired pneumonia) 05/28/2011  . BPH (benign prostatic hyperplasia) 05/07/2011  . Hypothyroidism 08/05/2010  . Essential hypertension, benign 08/05/2010      Labs reviewed: Basic Metabolic Panel:    Component Value Date/Time   NA 138 04/11/2016 0509   NA 138 04/11/2016   K 3.3 (L) 04/11/2016 0509   CL 99 (L) 04/11/2016 0509   CO2 31 04/11/2016 0509   GLUCOSE 87 04/11/2016 0509   BUN 26 (H) 04/11/2016 0509   BUN 26 (A) 04/11/2016   CREATININE 1.26 (H) 04/11/2016 0509   CALCIUM 8.2 (L) 04/11/2016 0509   PROT 5.1 (L) 04/11/2016 0509   ALBUMIN 2.5 (L) 04/11/2016 0509   AST 24 04/11/2016 0509   ALT 31 04/11/2016 0509   ALKPHOS 72 04/11/2016 0509   BILITOT 0.8 04/11/2016 0509   GFRNONAA 48 (L) 04/11/2016 0509   GFRAA 56 (L) 04/11/2016 0509     Recent Labs  04/09/16 0509 04/10/16  04/10/16 0452 04/11/16 04/11/16 0509  NA 135 137 137 138 138  K 4.0 3.5 3.5  --  3.3*  CL 105  --  103  --  99*  CO2 26  --  27  --  31  GLUCOSE 91  --  81  --  87  BUN 22* 21 21* 26* 26*  CREATININE 1.08  1.11 1.0 1.03 1.3 1.26*  CALCIUM 7.9*  --  8.0*  --  8.2*   Liver Function Tests:  Recent Labs  04/03/16 0424 04/10/16 04/10/16 0452 04/11/16 0509  AST 121* 29 29 24   ALT 75* 34 34 31  ALKPHOS 32* 63 63 72  BILITOT 1.1  --  0.9 0.8  PROT 5.5*  --  4.9* 5.1*  ALBUMIN 3.2*  --  2.5* 2.5*   No results for input(s): LIPASE, AMYLASE in the last 8760 hours. No results for input(s): AMMONIA in the last 8760 hours. CBC:  Recent Labs  04/02/16 1640  04/03/16 0424  04/06/16 0533 04/09/16 04/09/16 0509 04/11/16 04/11/16 0509  WBC 4.7  --  3.8*  < > 6.1 8.7 8.7 10.3 10.3  NEUTROABS 3.5  --  2.9  --   --   --   --   --   --   HGB 12.3*  --  10.2*  < > 9.3* 8.9* 8.9*  --  8.9*  HCT 36.0*  < > 29.3*  < > 27.3* 26* 25.8*  --  26.0*  MCV 103.2*  --  103.9*  --  103.0*  --  105.3*  --  104.4*  PLT 113*  --  90*  < > 120* 203 203  --  288  < > = values in this interval not displayed. Lipid No results for input(s): CHOL, HDL, LDLCALC, TRIG in the last 8760 hours.  Cardiac Enzymes:  Recent Labs  04/02/16 1640  04/03/16 0424 04/03/16 1027 04/03/16 1720 04/06/16 0533  CKTOTAL 4,826*  --  2,900*  --   --  574*  TROPONINI  --   < > 0.11* 0.09* 0.09*  --   < > = values in this interval not displayed. BNP:  Recent Labs  04/02/16 1640  BNP 205.6*   No results found for: [facility] Lab Results  Component Value Date   HGBA1C 5.5 05/07/2011   Lab Results  Component Value Date   TSH 33.990 (H) 04/02/2016   Lab Results  Component Value Date   VITAMINB12 316 04/02/2016   Lab Results  Component Value Date  FOLATE 18.1 05/07/2011   Lab Results  Component Value Date   IRON 29 (L) 04/02/2016   TIBC 228 (L) 04/02/2016   FERRITIN 241 04/02/2016    Imaging and  Procedures obtained prior to SNF admission: Dg Chest 2 View  Result Date: 04/02/2016 CLINICAL DATA:  Unwitnessed fall EXAM: CHEST  2 VIEW COMPARISON:  02/27/2015 chest radiograph. FINDINGS: Left rotated chest radiograph. Stable cardiomediastinal silhouette with borderline mild cardiomegaly and aortic atherosclerosis. No pneumothorax. Trace bilateral pleural effusions. There are hazy bilateral parahilar opacities, most prominent in the right upper lung. No displaced fractures. IMPRESSION: Hazy parahilar lung opacities, most prominent in the right upper lung, potentially representing mild pulmonary edema given the borderline cardiomegaly and trace bilateral pleural effusions, with the differential including infection or aspiration. Recommend follow-up PA and lateral post treatment chest radiographs in 4-6 weeks. Electronically Signed   By: Delbert Phenix M.D.   On: 04/02/2016 17:16   Ct Head Wo Contrast  Result Date: 04/02/2016 CLINICAL DATA:  Unwitnessed fall. EXAM: CT HEAD WITHOUT CONTRAST CT CERVICAL SPINE WITHOUT CONTRAST TECHNIQUE: Multidetector CT imaging of the head and cervical spine was performed following the standard protocol without intravenous contrast. Multiplanar CT image reconstructions of the cervical spine were also generated. COMPARISON:  02/27/2015 head and cervical spine CT. FINDINGS: CT HEAD FINDINGS Brain: No evidence of parenchymal hemorrhage or extra-axial fluid collection. No mass lesion, mass effect, or midline shift. No CT evidence of acute infarction. Intracranial atherosclerosis. Nonspecific moderate subcortical and periventricular white matter hypodensity, most in keeping with chronic small vessel ischemic change. Generalized cerebral volume loss. Cerebral ventricle sizes are stable and concordant with the degree of cerebral volume loss. Vascular: No hyperdense vessel or unexpected calcification. Skull: No evidence of calvarial fracture. Bilateral scleral banding. Sinuses/Orbits:  The visualized paranasal sinuses are essentially clear. Other:  The mastoid air cells are unopacified. CT CERVICAL SPINE FINDINGS Alignment: Minimal 2 mm anterolisthesis at C4-5 is stable. Mild 3 mm anterolisthesis at C7-T1 is stable. No acute subluxation. Skull base and vertebrae: No acute fracture. No primary bone lesion or focal pathologic process. Soft tissues and spinal canal: No prevertebral fluid or swelling. No visible canal hematoma. Disc levels: Severe degenerative disc disease throughout the cervical spine, most prominent at C6-7, noting stable ankylosis at C3-4. Severe bilateral facet arthropathy. Moderate degenerative foraminal stenosis on the right at C2-3. Mild degenerative foraminal stenosis bilaterally at C3-4. Mild degenerative foraminal stenosis on the right at C4-5 and C5-6. Upper chest: Stable pleural-parenchymal scarring at the right greater than left lung apices. Other: Stable calcified 1.0 cm nodule at the superior right side lobe. IMPRESSION: 1. No evidence of acute intracranial abnormality. No evidence of calvarial fracture . 2. Moderate chronic small vessel ischemia and generalized cerebral volume loss. 3. No fracture or acute malalignment in the cervical spine . 4. Severe degenerative changes in the cervical spine as described. Electronically Signed   By: Delbert Phenix M.D.   On: 04/02/2016 17:37   Ct Cervical Spine Wo Contrast  Result Date: 04/02/2016 CLINICAL DATA:  Unwitnessed fall. EXAM: CT HEAD WITHOUT CONTRAST CT CERVICAL SPINE WITHOUT CONTRAST TECHNIQUE: Multidetector CT imaging of the head and cervical spine was performed following the standard protocol without intravenous contrast. Multiplanar CT image reconstructions of the cervical spine were also generated. COMPARISON:  02/27/2015 head and cervical spine CT. FINDINGS: CT HEAD FINDINGS Brain: No evidence of parenchymal hemorrhage or extra-axial fluid collection. No mass lesion, mass effect, or midline shift. No CT evidence  of acute  infarction. Intracranial atherosclerosis. Nonspecific moderate subcortical and periventricular white matter hypodensity, most in keeping with chronic small vessel ischemic change. Generalized cerebral volume loss. Cerebral ventricle sizes are stable and concordant with the degree of cerebral volume loss. Vascular: No hyperdense vessel or unexpected calcification. Skull: No evidence of calvarial fracture. Bilateral scleral banding. Sinuses/Orbits: The visualized paranasal sinuses are essentially clear. Other:  The mastoid air cells are unopacified. CT CERVICAL SPINE FINDINGS Alignment: Minimal 2 mm anterolisthesis at C4-5 is stable. Mild 3 mm anterolisthesis at C7-T1 is stable. No acute subluxation. Skull base and vertebrae: No acute fracture. No primary bone lesion or focal pathologic process. Soft tissues and spinal canal: No prevertebral fluid or swelling. No visible canal hematoma. Disc levels: Severe degenerative disc disease throughout the cervical spine, most prominent at C6-7, noting stable ankylosis at C3-4. Severe bilateral facet arthropathy. Moderate degenerative foraminal stenosis on the right at C2-3. Mild degenerative foraminal stenosis bilaterally at C3-4. Mild degenerative foraminal stenosis on the right at C4-5 and C5-6. Upper chest: Stable pleural-parenchymal scarring at the right greater than left lung apices. Other: Stable calcified 1.0 cm nodule at the superior right side lobe. IMPRESSION: 1. No evidence of acute intracranial abnormality. No evidence of calvarial fracture . 2. Moderate chronic small vessel ischemia and generalized cerebral volume loss. 3. No fracture or acute malalignment in the cervical spine . 4. Severe degenerative changes in the cervical spine as described. Electronically Signed   By: Delbert Phenix M.D.   On: 04/02/2016 17:37   US Abdomen Limited Ruq  Result Date: 04/03/2016 CLINICAL DATA:  Abnormal LFTs EXAM: US ABDOMEN LIMITED - RIGHT UPPER QUADRANT COMPARISON:   10/16/2006 CT abdomen with contrast FINDINGS: Gallbladder: Limited visualization because of upper abdominal obscuring bowel gas. No definite gallstones, wall thickening or pericholecystic fluid. Wall thickness measures 2 mm. No Murphy's sign. Common bile duct: Diameter: 1.7 mm. Liver: Also limited because of overlying obscuring bowel gas. No definite focal abnormality or intrahepatic biliary dilatation. Patent hepatic and portal veins with normal directional flow. Small right effusion noted during the exam. IMPRESSION: Limited exam because of bowel gas but no definite acute finding by ultrasound. No biliary dilatation Small right pleural effusion Electronically Signed   By: Judie Petit.  Shick M.D.   On: 04/03/2016 08:38     Not all labs, radiology exams or other studies done during hospitalization come through on my EPIC note; however they are reviewed by me.    Assessment and Plan  ASPIRATION PNA/ ACUTE RESP FAILURE WITH HYPOXIA - lobar RUL and LLL, txed with zosyn then augmentin SNF - cont Augmentin for 5 more days and O2 by [place] to keep O2> 90%  RHABDOMYOLYSIS/ ELEVATED LFT - tx with IVF; last 10/29 CK 574 down from 2900 3 days prior SNF - support pt in keeping hydrated  FTT/DEMENTIA SNF - admitted for OT/PT and supportive care  ACUTE ON CKD3- resolved with IVF SNF - will f/u BMP  HYPOTHYROIDISM SNF - stable; cont synthroid 75 mcg daily  HTN SNF - pt was taken of BP meds in hospital;will monitor and restart meds as needed  DECUBITUS ULCERS-  Sacrum and 4 on scapulae and mid thorax SNF - wound care nurse to delineate and follow  B12 DEFICIENCY/MACROCYTIC ANEMIA SNF - cont monthly supplementation; will follow CBC      Time spent . 45 min;> 50% of time with patient was spent reviewing records, labs, tests and studies, counseling and developing plan of care  Thurston Hole D. [name], MD

## 2016-05-15 ENCOUNTER — Non-Acute Institutional Stay (SKILLED_NURSING_FACILITY): Payer: Medicare Other | Admitting: Internal Medicine

## 2016-05-15 ENCOUNTER — Encounter: Payer: Self-pay | Admitting: Internal Medicine

## 2016-05-15 DIAGNOSIS — D519 Vitamin B12 deficiency anemia, unspecified: Secondary | ICD-10-CM | POA: Diagnosis not present

## 2016-05-15 DIAGNOSIS — D696 Thrombocytopenia, unspecified: Secondary | ICD-10-CM | POA: Diagnosis not present

## 2016-05-15 DIAGNOSIS — D518 Other vitamin B12 deficiency anemias: Secondary | ICD-10-CM

## 2016-05-15 DIAGNOSIS — I10 Essential (primary) hypertension: Secondary | ICD-10-CM | POA: Diagnosis not present

## 2016-05-15 NOTE — Progress Notes (Signed)
Location:  [facility] Room Number: 112P Place of Service:  SNF ([phone])  Randon Goldsmithnne D. Lyn HollingsheadAlexander, MD  Patient Care Team: Etta Grandchildhomas L Jones, MD as PCP - General (Internal Medicine)  Extended Emergency Contact Information Primary Emergency Contact: Erline LevineShaw,Helen E Address: [address]          [place], [place] [address] [place] Home Phone: [phone] Relation: Spouse Secondary Emergency Contact: Cindie LarocheWeaver,Betty  [place] Home Phone: [phone] Relation: Relative    Allergies: Patient has no known allergies.  Chief Complaint  Patient presents with  . Medical Management of Chronic Issues    Routine Visit    HPI: Patient is 82 y.o. male who is being seen for routine issues of HTN, b12 def and thrombocytopenia.  Past Medical History:  Diagnosis Date  . Absolute anemia   . Anemia    NOS  . Atherosclerosis of native arteries of extremities with intermittent claudication, unspecified extremity (HCC) 01/20/2013  . BPH (benign prostatic hyperplasia) 05/07/2011  . CAP (community acquired pneumonia) 05/28/2011  . Colon polyps   . Complete rotator cuff tear of left shoulder 03/31/2013  . Constipation 06/18/2011  . Dementia   . Dermatitis, atopic 08/05/2012  . Diverticulosis   . Essential hypertension, benign 08/05/2010  . Failure to thrive in adult 02/14/2014  . Hypertension   . Hypothyroidism 08/05/2010  . Lumbago 02/08/2015  . OAB (overactive bladder) 03/24/2012  . Post-traumatic wound infection 03/20/2015    Past Surgical History:  Procedure Laterality Date  . COLONOSCOPY     multiple  . EYE SURGERY    . HEMORRHOID SURGERY  1972  . INGUINAL HERNIA REPAIR  04/1995  . VITRECTOMY  07/1990   for macular hole    Allergies as of 05/15/2016   No Known Allergies     Medication List       Accurate as of 05/15/16 11:59 PM. Always use your most recent med list.          acetaminophen 325 MG tablet Commonly  known as:  TYLENOL Take 2 tablets (650 mg total) by mouth every 6 (six) hours as needed for mild pain (or Fever >/= 101).   B-12 IJ Inject as directed every 30 (thirty) days.   dorzolamide-timolol 22.3-6.8 MG/ML ophthalmic solution Commonly known as:  COSOPT Place 1 drop into the left eye 2 (two) times daily.   feeding supplement (ENSURE ENLIVE) Liqd Take 237 mLs by mouth 2 (two) times daily between meals.   feeding supplement (PRO-STAT SUGAR FREE 64) Liqd Take 30 mLs by mouth 2 (two) times daily.   ipratropium-albuterol 0.5-2.5 (3) MG/3ML Soln Commonly known as:  DUONEB Take 3 mLs by nebulization every 4 (four) hours as needed.   SYNTHROID 75 MCG tablet Generic drug:  levothyroxine Take 1 tablet (75 mcg total) by mouth daily. Yearly physical is due must see MD for refills       No orders of the defined types were placed in this encounter.   Immunization History  Administered Date(s) Administered  . Influenza Split 03/19/2011, 03/03/2012  . Influenza Whole 03/14/2010  . Influenza, High Dose Seasonal PF 03/24/2013, 02/08/2015  . Influenza,inj,Quad PF,36+ Mos 02/08/2014  . PPD Test 04/11/2016  . Pneumococcal Conjugate-13 03/24/2013  . Pneumococcal Polysaccharide-23 03/10/2007  . Td 09/08/2007  . Tdap 07/09/2011, 02/27/2015  . Zoster 12/03/2012    Social History  Substance Use Topics  . Smoking status: Never Smoker  . Smokeless tobacco: Never Used  Comment: "few cigarettes when young"  . Alcohol use No     Comment: occasionally,     Review of Systems  UTO 2/2 dementi ; nursing with no cncerns    Vitals:   05/15/16 1353  BP: 128/70  Pulse: 77  Resp: 16  Temp: 97.8 F (36.6 C)   Body mass index is 18.51 kg/m. Physical Exam  GENERAL APPEARANCE: Alert, mod conversant, No acute distress  SKIN: No diaphoresis rash HEENT: Unremarkable RESPIRATORY: Breathing is even, unlabored. Lung sounds are clear   CARDIOVASCULAR: Heart RRR no murmurs, rubs or  gallops. No peripheral edema  GASTROINTESTINAL: Abdomen is soft, non-tender, not distended w/ normal bowel sounds.  GENITOURINARY: Bladder non tender, not distended  MUSCULOSKELETAL: No abnormal joints or musculature NEUROLOGIC: Cranial nerves 2-12 grossly intact. Moves all extremities PSYCHIATRIC: dementia, no behavioral issues  Patient Active Problem List   Diagnosis Date Noted  . Decubitus ulcer of back, stage 2 04/14/2016  . Rhabdomyolysis 04/02/2016  . Hypoxia 04/02/2016  . Decubitus ulcer 04/02/2016  . Dementia 04/02/2016  . CKD (chronic kidney disease), stage III 04/02/2016  . Macrocytic anemia with vitamin B12 deficiency 04/02/2016  . Thrombocytopenia (HCC) 04/02/2016  . Acute respiratory failure with hypoxia (HCC) 04/02/2016  . Pneumonia 04/02/2016  . Abnormal LFTs 04/02/2016  . AKI (acute kidney injury) (HCC)   . Aspiration pneumonia (HCC)   . Non-traumatic rhabdomyolysis   . Absolute anemia   . Post-traumatic wound infection 03/20/2015  . Lumbago 02/08/2015  . Hip pain, bilateral 02/08/2015  . Failure to thrive in adult 02/14/2014  . Complete rotator cuff tear of left shoulder 03/31/2013  . Atherosclerosis of native arteries of the extremities with intermittent claudication 01/20/2013  . Dermatitis, atopic 08/05/2012  . OAB (overactive bladder) 03/24/2012  . Constipation 06/18/2011  . CAP (community acquired pneumonia) 05/28/2011  . BPH (benign prostatic hyperplasia) 05/07/2011  . Hypothyroidism 08/05/2010  . Essential hypertension, benign 08/05/2010    CMP     Component Value Date/Time   NA 142 05/27/2016   K 4.5 05/27/2016   CL 99 (L) 04/11/2016 0509   CO2 31 04/11/2016 0509   GLUCOSE 87 04/11/2016 0509   BUN 26 (A) 05/27/2016   CREATININE 0.8 05/27/2016   CREATININE 1.26 (H) 04/11/2016 0509   CALCIUM 8.2 (L) 04/11/2016 0509   PROT 5.1 (L) 04/11/2016 0509   ALBUMIN 2.5 (L) 04/11/2016 0509   AST 16 05/27/2016   ALT 12 05/27/2016   ALKPHOS 95  05/27/2016   BILITOT 0.8 04/11/2016 0509   GFRNONAA 48 (L) 04/11/2016 0509   GFRAA 56 (L) 04/11/2016 0509    Recent Labs  04/09/16 0509  04/10/16 0452 04/11/16 04/11/16 0509 05/27/16  NA 135  < > 137 138 138 142  K 4.0  < > 3.5  --  3.3* 4.5  CL 105  --  103  --  99*  --   CO2 26  --  27  --  31  --   GLUCOSE 91  --  81  --  87  --   BUN 22*  < > 21* 26* 26* 26*  CREATININE 1.08  1.11  < > 1.03 1.3 1.26* 0.8  CALCIUM 7.9*  --  8.0*  --  8.2*  --   < > = values in this interval not displayed.  Recent Labs  04/03/16 0424  04/10/16 0452 04/11/16 0509 05/27/16  AST 121*  < > 29 24 16   ALT 75*  < > 34 31  12  ALKPHOS 32*  < > 63 72 95  BILITOT 1.1  --  0.9 0.8  --   PROT 5.5*  --  4.9* 5.1*  --   ALBUMIN 3.2*  --  2.5* 2.5*  --   < > = values in this interval not displayed.  Recent Labs  04/02/16 1640  04/03/16 0424  04/06/16 0533  04/09/16 0509 04/11/16 04/11/16 0509 05/27/16  WBC 4.7  --  3.8*  < > 6.1  < > 8.7 10.3 10.3 6.9  NEUTROABS 3.5  --  2.9  --   --   --   --   --   --   --   HGB 12.3*  --  10.2*  < > 9.3*  < > 8.9*  --  8.9* 12.0*  HCT 36.0*  < > 29.3*  < > 27.3*  < > 25.8*  --  26.0* 37*  MCV 103.2*  --  103.9*  --  103.0*  --  105.3*  --  104.4*  --   PLT 113*  --  90*  < > 120*  < > 203  --  288 209  < > = values in this interval not displayed. No results for input(s): CHOL, LDLCALC, TRIG in the last 8760 hours.  Invalid input(s): HCL No results found for: [facility] Lab Results  Component Value Date   TSH 33.990 (H) 04/02/2016   Lab Results  Component Value Date   HGBA1C 5.5 05/07/2011   Lab Results  Component Value Date   CHOL 148 11/08/2014   HDL 54.10 11/08/2014   LDLCALC 79 11/08/2014   TRIG 73.0 11/08/2014   CHOLHDL 3 11/08/2014    Significant Diagnostic Results in last 30 days:  No results found.  Assessment and Plan  Essential hypertension, benign Controlled on no meds; will monitor  Macrocytic anemia with vitamin B12  deficiency B12 level was 313, low normal;plan to cont 1000u monthly per injection  Thrombocytopenia (HCC) PLT 209 in 05/2016; will monitor at intervals    Thurston Hole D. [name], MD

## 2016-05-26 ENCOUNTER — Encounter: Payer: Self-pay | Admitting: Internal Medicine

## 2016-05-26 ENCOUNTER — Non-Acute Institutional Stay (SKILLED_NURSING_FACILITY): Payer: Medicare Other | Admitting: Internal Medicine

## 2016-05-26 DIAGNOSIS — F05 Delirium due to known physiological condition: Secondary | ICD-10-CM | POA: Diagnosis not present

## 2016-05-26 DIAGNOSIS — J189 Pneumonia, unspecified organism: Secondary | ICD-10-CM | POA: Diagnosis not present

## 2016-05-26 NOTE — Progress Notes (Signed)
Location[facility] Room Number: [id number] Place of Service:  SNF [phone])  Connor Miller. Connor Hollingshead, MD  Patient Care Team: Etta Grandchild, MD as PCP - General (Internal Medicine)  Extended Emergency Contact Information Primary Emergency Contact: Connor Miller, Kinker Address: [address]          [place], [place] [address] [place] Home Phone: [phone] Relation: Spouse Secondary Emergency Contact: Connor [place] Home Phone: [phone] Relation: Relative    Allergies: Patient has no known allergies.  Chief Complaint  Patient presents with  . Acute Visit    Acute    HPI: Patient is 82 y.o. male who is being seen acutely because he is falling more and appears more confused per nursing. Per wife ( who also has dementia)  He is actijg o differently. Per pt who is eating his lunch he feels fine.He denies cough. No fever per nursing.  Past Medical History:  Diagnosis Date  . Absolute anemia   . Anemia    NOS  . Atherosclerosis of native arteries of extremities with intermittent claudication, unspecified extremity (HCC) 01/20/2013  . BPH (benign prostatic hyperplasia) 05/07/2011  . CAP (community acquired pneumonia) 05/28/2011  . Colon polyps   . Complete rotator cuff tear of left shoulder 03/31/2013  . Constipation 06/18/2011  . Dementia   . Dermatitis, atopic 08/05/2012  . Diverticulosis   . Essential hypertension, benign 08/05/2010  . Failure to thrive in adult 02/14/2014  . Hypertension   . Hypothyroidism 08/05/2010  . Lumbago 02/08/2015  . OAB (overactive bladder) 03/24/2012  . Post-traumatic wound infection 03/20/2015    Past Surgical History:  Procedure Laterality Date  . COLONOSCOPY     multiple  . EYE SURGERY    . HEMORRHOID SURGERY  1972  . INGUINAL HERNIA REPAIR  04/1995  . VITRECTOMY  07/1990   for macular hole    Allergies as of 05/26/2016   No Known Allergies     Medication List         Accurate as of 05/26/16 11:46 AM. Always use your most recent med list.          acetaminophen 325 MG tablet Commonly known as:  TYLENOL Take 2 tablets (650 mg total) by mouth every 6 (six) hours as needed for mild pain (or Fever >/= 101).   cyanocobalamin 1000 MCG/ML injection Commonly known as:  (VITAMIN B-12) Inject 1,000 mcg into the muscle every 30 (thirty) days.   dorzolamide-timolol 22.3-6.8 MG/ML ophthalmic solution Commonly known as:  COSOPT Place 1 drop into the left eye 2 (two) times daily.   feeding supplement (ENSURE ENLIVE) Liqd Take 237 mLs by mouth 2 (two) times daily between meals.   feeding supplement (PRO-STAT SUGAR FREE 64) Liqd Take 30 mLs by mouth 2 (two) times daily.   ipratropium-albuterol 0.5-2.5 (3) MG/3ML Soln Commonly known as:  DUONEB Take 3 mLs by nebulization every 4 (four) hours as needed.   LORazepam 0.5 MG tablet Commonly known as:  ATIVAN Take 0.5 mg by mouth 2 (two) times daily. At 6 am and 8 pm.   SYNTHROID 75 MCG tablet Generic drug:  levothyroxine Take 1 tablet (75 mcg total) by mouth daily. Yearly physical is due must see MD for refills       Meds ordered this encounter  Medications  . LORazepam (ATIVAN) 0.5 MG tablet    Sig: Take 0.5 mg by mouth 2 (two) times daily. At 6  am and 8 pm.  . cyanocobalamin (,VITAMIN B-12,) 1000 MCG/ML injection    Sig: Inject 1,000 mcg into the muscle every 30 (thirty) days.    Immunization History  Administered Date(s) Administered  . Influenza Split 03/19/2011, 03/03/2012  . Influenza Whole 03/14/2010  . Influenza, High Dose Seasonal PF 03/24/2013, 02/08/2015  . Influenza,inj,Quad PF,36+ Mos 02/08/2014  . PPD Test 04/11/2016  . Pneumococcal Conjugate-13 03/24/2013  . Pneumococcal Polysaccharide-23 03/10/2007  . Td 09/08/2007  . Tdap 07/09/2011, 02/27/2015  . Zoster 12/03/2012    Social History  Substance Use Topics  . Smoking status: Never Smoker  . Smokeless tobacco: Never  Used     Comment: "few cigarettes when young"  . Alcohol use No     Comment: occasionally,     Review of Systems  DATA OBTAINED: from patient- can not fully participate; per nursing as in HPI GENERAL:  no fevers, fatigue, appetite changes SKIN: No itching, rash HEENT: No complaint RESPIRATORY: No cough, wheezing, SOB CARDIAC: No chest pain, palpitations, lower extremity edema  GI: No abdominal pain, No N/V/D or constipation, No heartburn or reflux  GU: No dysuria, frequency or urgency, or incontinence  MUSCULOSKELETAL: No unrelieved bone/joint pain NEUROLOGIC: No headache, dizziness  PSYCHIATRIC: No overt anxiety or sadness  Vitals:   05/26/16 1134  BP: 130/75  Pulse: 98  Resp: 18  Temp: (!) 87 F (30.6 C)   Body mass index is 18.54 kg/m. Physical Exam  GENERAL APPEARANCE: Alert, conversant, No acute distress  SKIN: No diaphoresis rash HEENT: Unremarkable RESPIRATORY: Breathing is even, unlabored. Lung sounds are decreased B base; O2 sat 94% RA  CARDIOVASCULAR: Heart RRR no murmurs, rubs or gallops. No peripheral edema  GASTROINTESTINAL: Abdomen is soft, non-tender, not distended w/ normal bowel sounds.  GENITOURINARY: Bladder non tender, not distended  MUSCULOSKELETAL: No abnormal joints or musculature NEUROLOGIC: Cranial nerves 2-12 grossly intact. Moves all extremities PSYCHIATRIC: Mood and affect appropriate to situation with dementia, no behavioral issues  Patient Active Problem List   Diagnosis Date Noted  . Decubitus ulcer of back, stage 2 04/14/2016  . Rhabdomyolysis 04/02/2016  . Hypoxia 04/02/2016  . Decubitus ulcer 04/02/2016  . Dementia 04/02/2016  . CKD (chronic kidney disease), stage III 04/02/2016  . Macrocytic anemia with vitamin B12 deficiency 04/02/2016  . Thrombocytopenia (HCC) 04/02/2016  . Acute respiratory failure with hypoxia (HCC) 04/02/2016  . Pneumonia 04/02/2016  . Abnormal LFTs 04/02/2016  . AKI (acute kidney injury) (HCC)   .  Aspiration pneumonia (HCC)   . Non-traumatic rhabdomyolysis   . Absolute anemia   . Post-traumatic wound infection 03/20/2015  . Lumbago 02/08/2015  . Hip pain, bilateral 02/08/2015  . Failure to thrive in adult 02/14/2014  . Complete rotator cuff tear of left shoulder 03/31/2013  . Atherosclerosis of native arteries of the extremities with intermittent claudication 01/20/2013  . Dermatitis, atopic 08/05/2012  . OAB (overactive bladder) 03/24/2012  . Constipation 06/18/2011  . CAP (community acquired pneumonia) 05/28/2011  . BPH (benign prostatic hyperplasia) 05/07/2011  . Hypothyroidism 08/05/2010  . Essential hypertension, benign 08/05/2010    CMP     Component Value Date/Time   NA 138 04/11/2016 0509   NA 138 04/11/2016   K 3.3 (L) 04/11/2016 0509   CL 99 (L) 04/11/2016 0509   CO2 31 04/11/2016 0509   GLUCOSE 87 04/11/2016 0509   BUN 26 (H) 04/11/2016 0509   BUN 26 (A) 04/11/2016   CREATININE 1.26 (H) 04/11/2016 0509   CALCIUM 8.2 (L)  04/11/2016 0509   PROT 5.1 (L) 04/11/2016 0509   ALBUMIN 2.5 (L) 04/11/2016 0509   AST 24 04/11/2016 0509   ALT 31 04/11/2016 0509   ALKPHOS 72 04/11/2016 0509   BILITOT 0.8 04/11/2016 0509   GFRNONAA 48 (L) 04/11/2016 0509   GFRAA 56 (L) 04/11/2016 0509    Recent Labs  04/09/16 0509 04/10/16 04/10/16 0452 04/11/16 04/11/16 0509  NA 135 137 137 138 138  K 4.0 3.5 3.5  --  3.3*  CL 105  --  103  --  99*  CO2 26  --  27  --  31  GLUCOSE 91  --  81  --  87  BUN 22* 21 21* 26* 26*  CREATININE 1.08  1.11 1.0 1.03 1.3 1.26*  CALCIUM 7.9*  --  8.0*  --  8.2*    Recent Labs  04/03/16 0424 04/10/16 04/10/16 0452 04/11/16 0509  AST 121* 29 29 24   ALT 75* 34 34 31  ALKPHOS 32* 63 63 72  BILITOT 1.1  --  0.9 0.8  PROT 5.5*  --  4.9* 5.1*  ALBUMIN 3.2*  --  2.5* 2.5*    Recent Labs  04/02/16 1640  04/03/16 0424  04/06/16 0533 04/09/16 04/09/16 0509 04/11/16 04/11/16 0509  WBC 4.7  --  3.8*  < > 6.1 8.7 8.7 10.3 10.3   NEUTROABS 3.5  --  2.9  --   --   --   --   --   --   HGB 12.3*  --  10.2*  < > 9.3* 8.9* 8.9*  --  8.9*  HCT 36.0*  < > 29.3*  < > 27.3* 26* 25.8*  --  26.0*  MCV 103.2*  --  103.9*  --  103.0*  --  105.3*  --  104.4*  PLT 113*  --  90*  < > 120* 203 203  --  288  < > = values in this interval not displayed. No results for input(s): CHOL, LDLCALC, TRIG in the last 8760 hours.  Invalid input(s): HCL No results found for: [facility] Lab Results  Component Value Date   TSH 33.990 (H) 04/02/2016   Lab Results  Component Value Date   HGBA1C 5.5 05/07/2011   Lab Results  Component Value Date   CHOL 148 11/08/2014   HDL 54.10 11/08/2014   LDLCALC 79 11/08/2014   TRIG 73.0 11/08/2014   CHOLHDL 3 11/08/2014    Significant Diagnostic Results in last 30 days:  No results found.  Assessment and Plan  CONFUSION/ HCAP - have ordered CMP, CBC, U/A, CXR - could be anythng   LATER entry - CXR has returned with bibasilar infiltrates; pt's Cr is 1.3, CrCl is calculated at 36.6; have written for Levaquin 750 mg q 48 hours for 4 doses    Time spent > 35 min;> 50% of time with patient was spent reviewing records, labs, tests and studies, counseling and developing plan of care  Thurston Holenne D. Connor HollingsheadAlexander, MD

## 2016-05-27 LAB — BASIC METABOLIC PANEL
BUN: 26 mg/dL — AB (ref 4–21)
CREATININE: 0.8 mg/dL (ref 0.6–1.3)
GLUCOSE: 78 mg/dL
POTASSIUM: 4.5 mmol/L (ref 3.4–5.3)
Sodium: 142 mmol/L (ref 137–147)

## 2016-05-27 LAB — HEPATIC FUNCTION PANEL
ALT: 12 U/L (ref 10–40)
AST: 16 U/L (ref 14–40)
Alkaline Phosphatase: 95 U/L (ref 25–125)
BILIRUBIN, TOTAL: 0.4 mg/dL

## 2016-05-27 LAB — CBC AND DIFFERENTIAL
HCT: 37 % — AB (ref 41–53)
Hemoglobin: 12 g/dL — AB (ref 13.5–17.5)
Platelets: 209 10*3/uL (ref 150–399)
WBC: 6.9 10*3/mL

## 2016-05-30 IMAGING — DX DG SHOULDER 2+V*R*
2 series · 2 of 2 positions shown · non-contrast
Comparison: 02/11/2012

CLINICAL DATA: Fell tonight on escalator, multiple skin tears and
abrasions RIGHT arm

EXAM:
RIGHT SHOULDER - 2+ VIEW

[shoulder grashey]
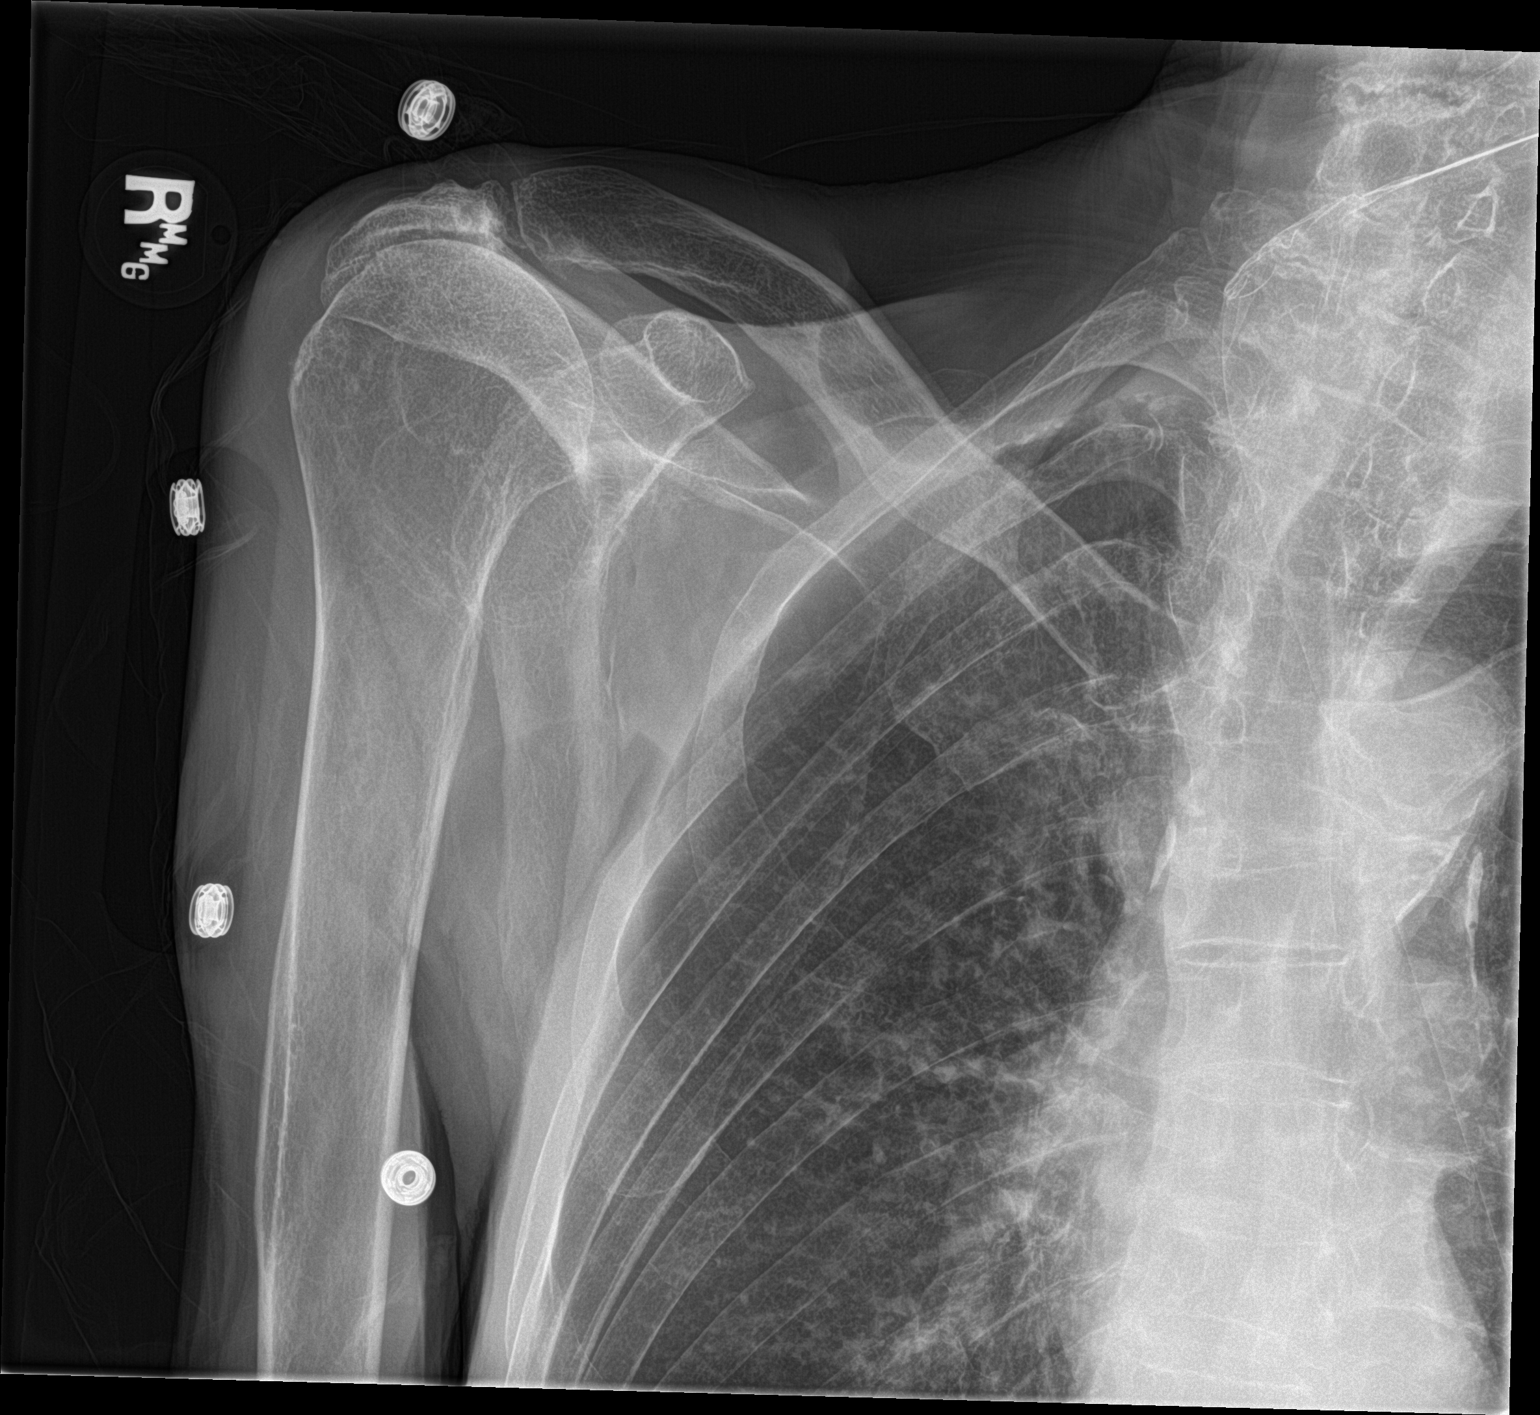

[shoulder y view]
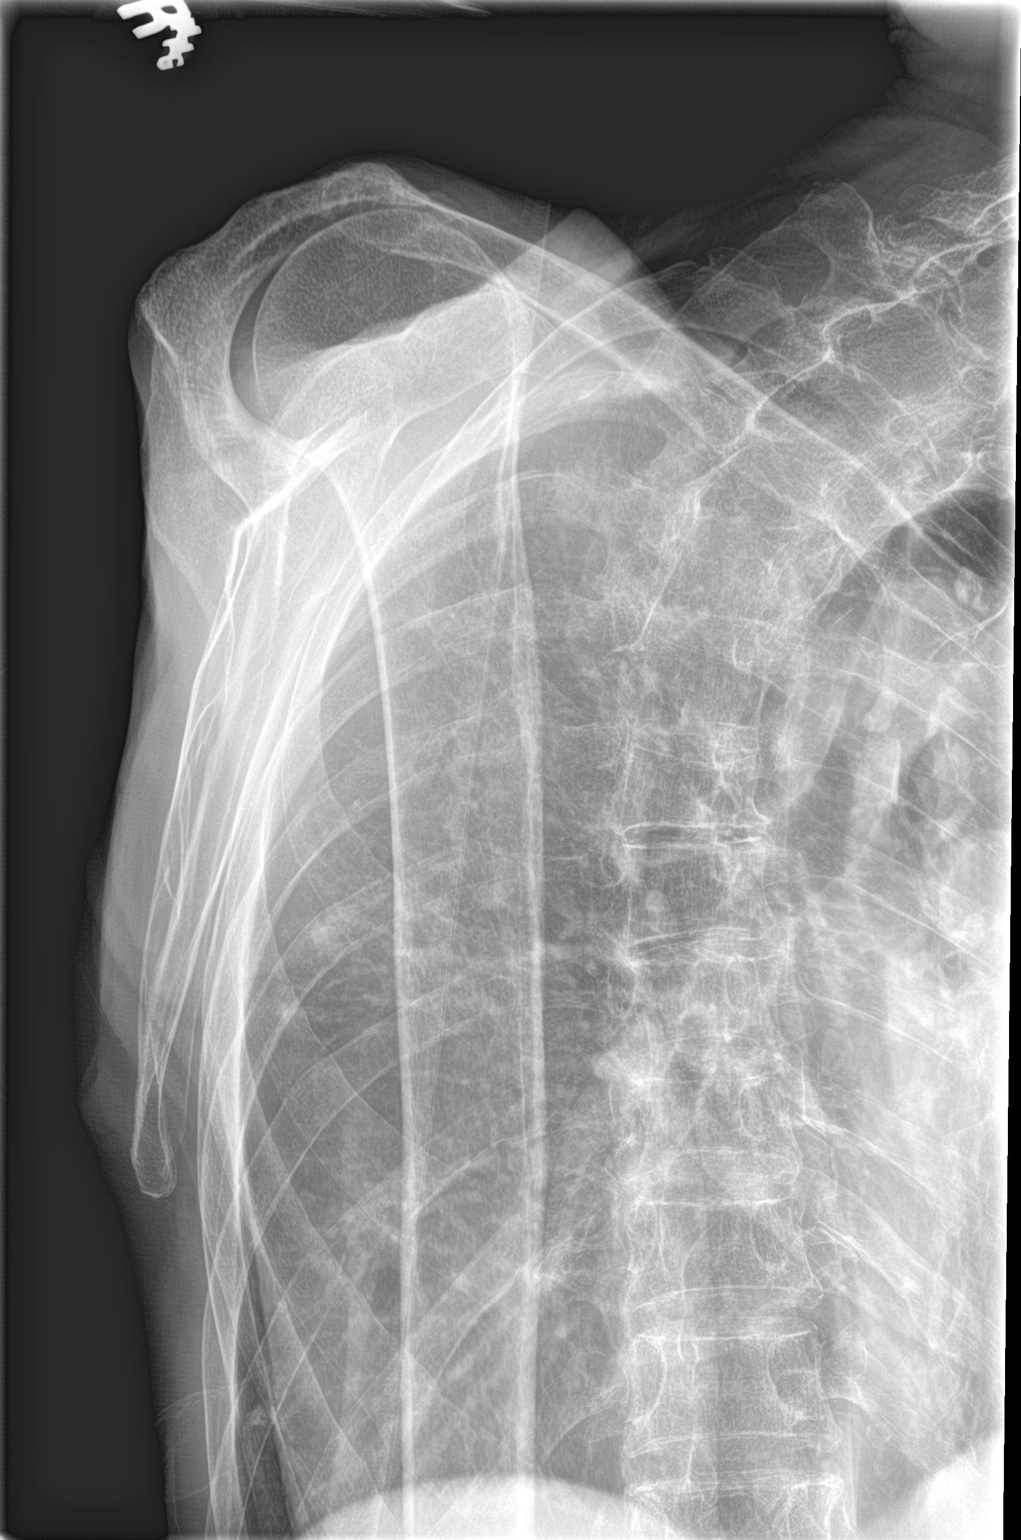

[2 of 2 positions shown; findings below may reference images not displayed]

FINDINGS: Osseous demineralization.

AC joint alignment normal with minimal degenerative changes.

Humeral head approximates undersurface of acromion compatible with
chronic rotator cuff tear.

No acute fracture, dislocation, or bone destruction.

Visualized RIGHT ribs intact.
IMPRESSION: Osseous demineralization with chronic rotator cuff tear.

No acute osseous abnormalities.

## 2016-05-30 IMAGING — DX DG FOREARM 2V*R*
2 series · 2 of 2 positions shown · non-contrast
Comparison: None

CLINICAL DATA: Fell on escalator, multiple abrasions and skin tears
RIGHT arm

EXAM:
RIGHT FOREARM - 2 VIEW

[forearm ap]
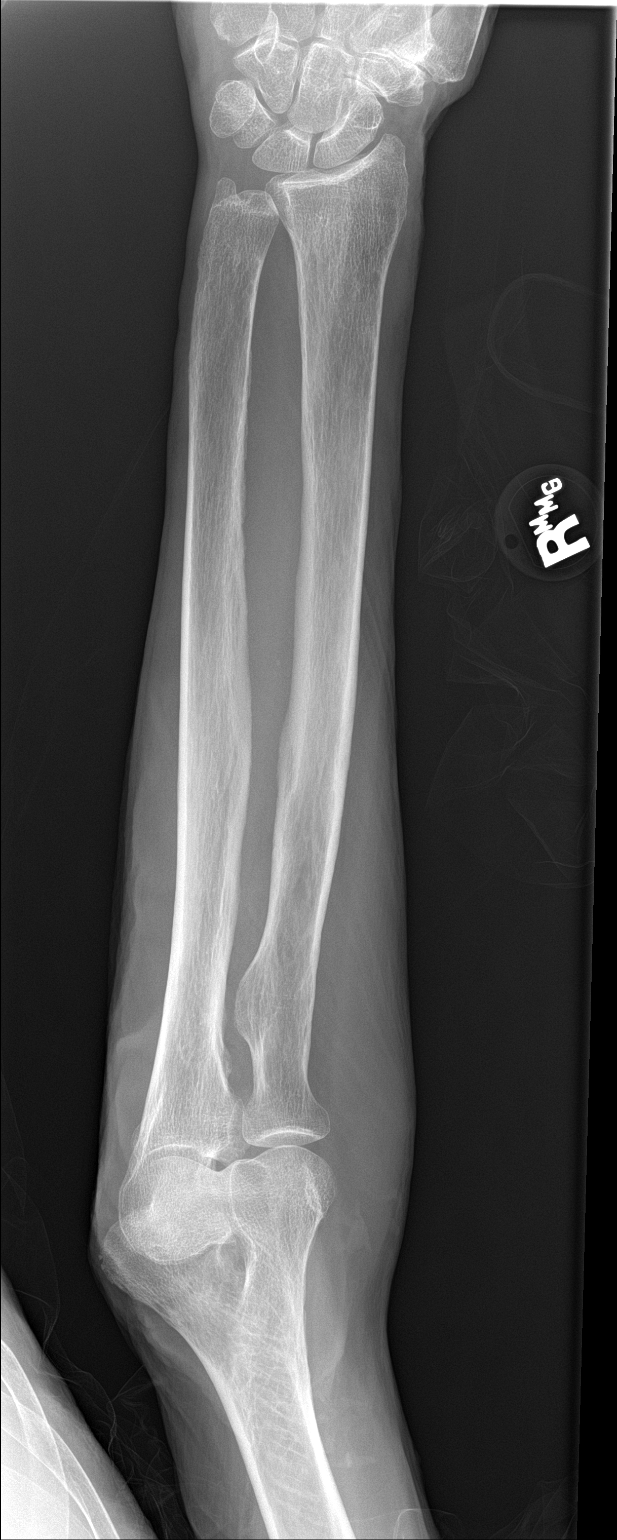

[forearm lat]
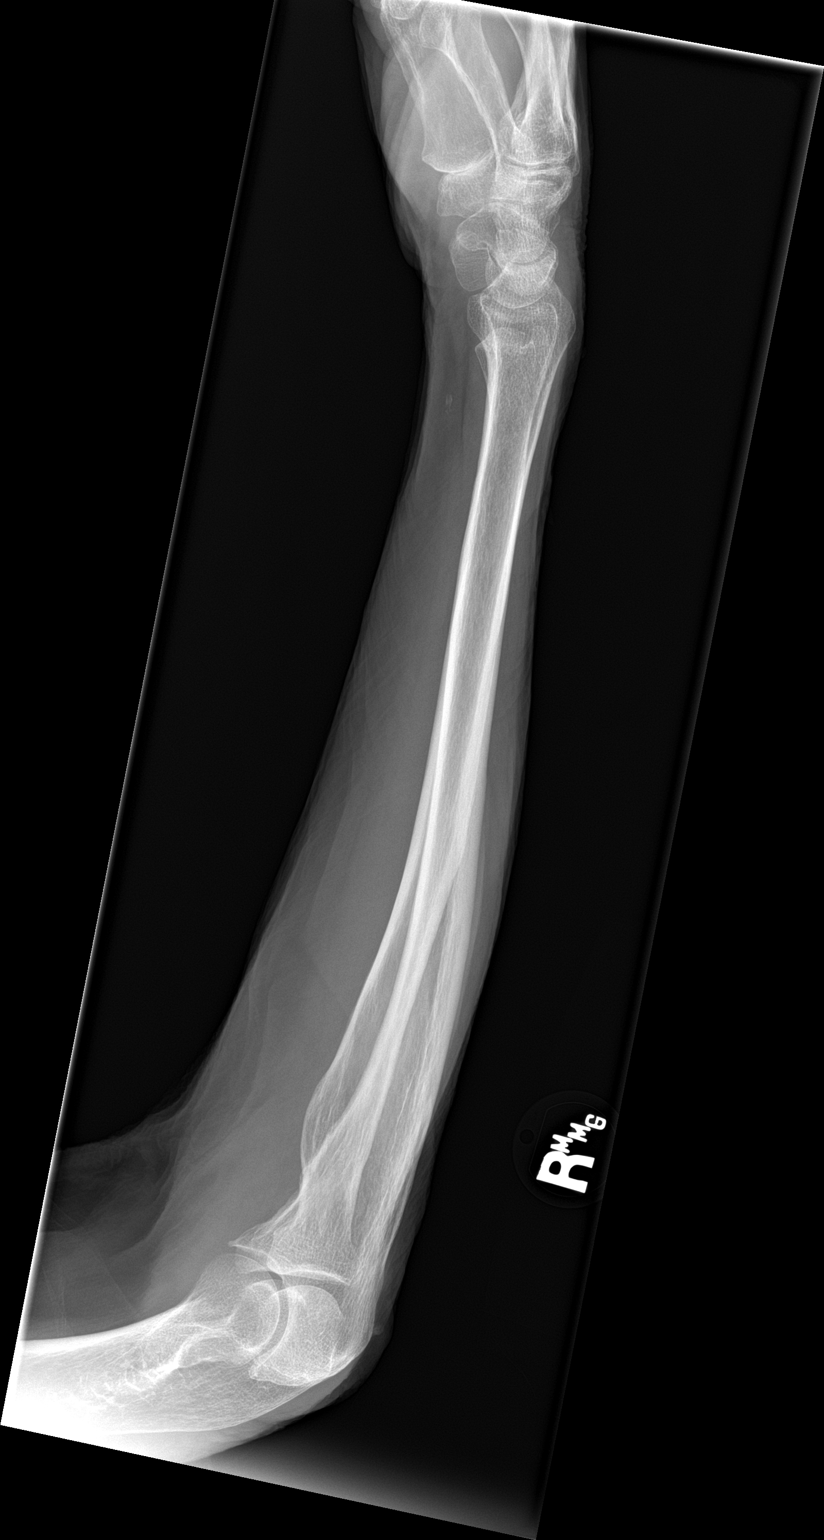

[2 of 2 positions shown; findings below may reference images not displayed]

FINDINGS: Osseous demineralization.

Wrist and elbow joint alignments normal.

No acute fracture, dislocation or bone destruction.
IMPRESSION: No acute osseous abnormalities.

## 2016-05-30 IMAGING — DX DG CHEST 2V
2 series · 2 of 2 positions shown · non-contrast
Comparison: Radiograph dated 09/13/2014

CLINICAL DATA: [AGE] male with fall and multiple abrasions to
the right arm.

EXAM:
CHEST  2 VIEW

[chest lat]
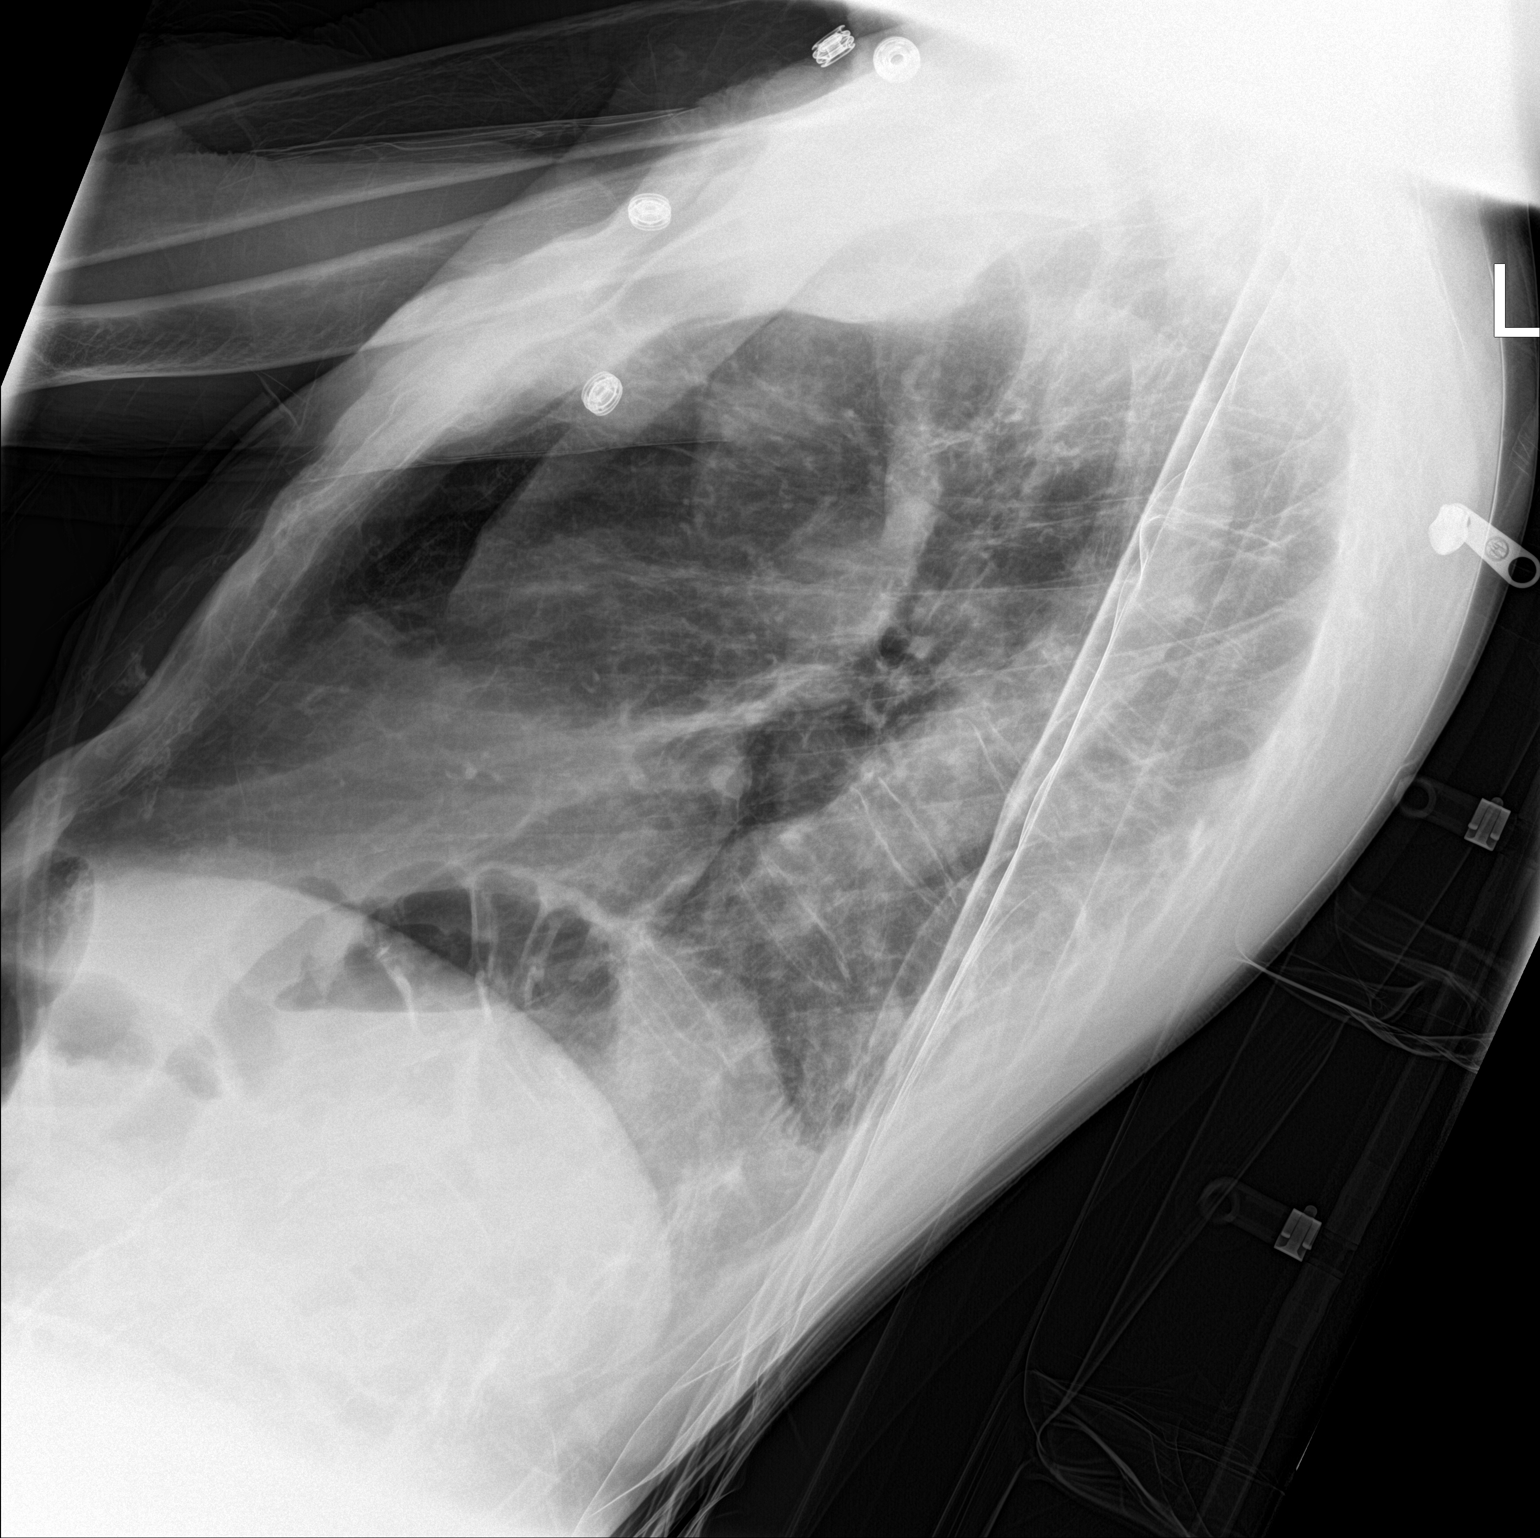

[chest ap]
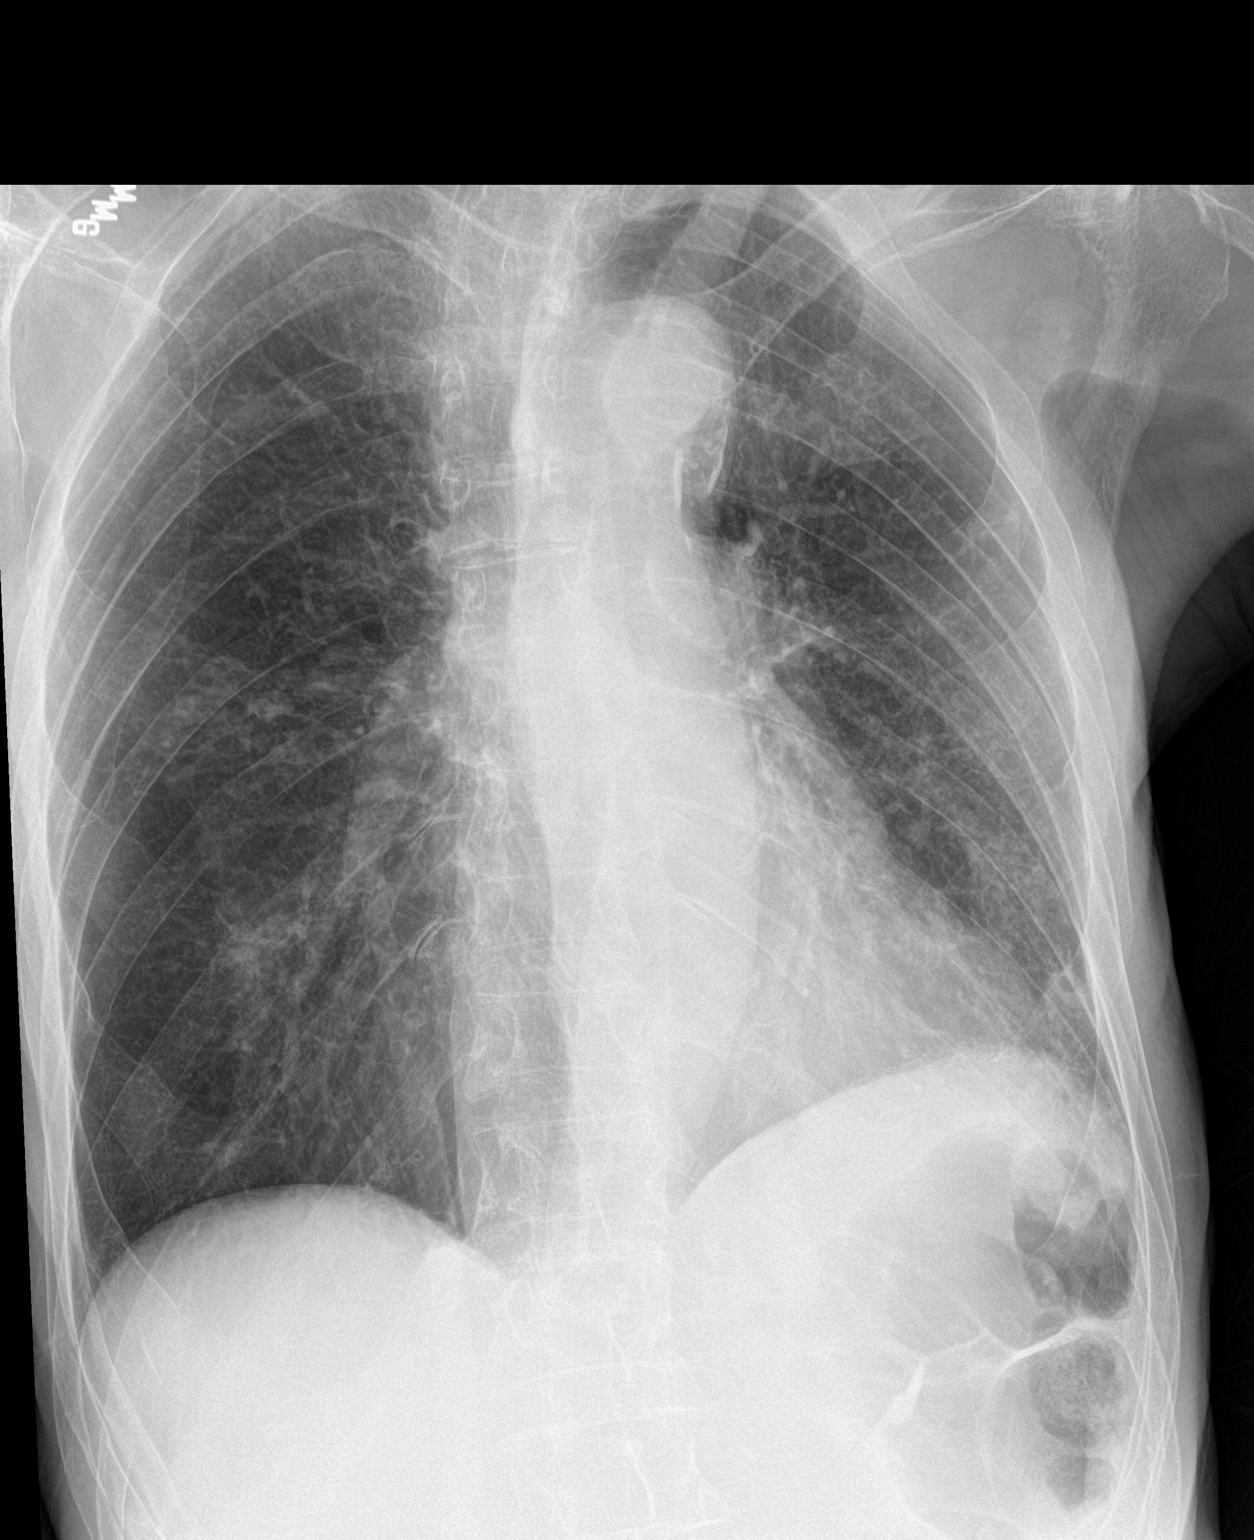

[2 of 2 positions shown; findings below may reference images not displayed]

FINDINGS: Two views of the chest demonstrate emphysematous changes of the
lungs. There is no focal consolidation, pleural effusion, or
pneumothorax. Stable cardiac silhouette. The bones are osteopenic.
There are focal irregularities of the right anterior fifth and sixth
ribs, age indeterminate, possibly chronic. Clinical correlation is
recommended. No definite other acute fracture.
IMPRESSION: Right anterior fifth and sixth rib irregularity, possibly chronic
changes. Clinical correlation is recommended. No other acute
intrathoracic pathology identified.

## 2016-06-08 ENCOUNTER — Encounter: Payer: Self-pay | Admitting: Internal Medicine

## 2016-06-08 NOTE — Assessment & Plan Note (Signed)
B12 level was 313, low normal;plan to cont 1000u monthly per injection

## 2016-06-08 NOTE — Assessment & Plan Note (Signed)
Controlled on no meds; will monitor

## 2016-06-08 NOTE — Assessment & Plan Note (Signed)
PLT 209 in 05/2016; will monitor at intervals

## 2016-06-13 ENCOUNTER — Other Ambulatory Visit: Payer: Self-pay | Admitting: *Deleted

## 2016-06-13 MED ORDER — LORAZEPAM 0.5 MG PO TABS: 0.5000 mg | ORAL_TABLET | Freq: Two times a day (BID) | ORAL | 0 refills | Status: AC

## 2016-06-13 NOTE — Telephone Encounter (Signed)
[facility] #[phone] Fax: [phone]  

## 2016-06-17 ENCOUNTER — Non-Acute Institutional Stay (SKILLED_NURSING_FACILITY): Payer: Medicare Other | Admitting: Internal Medicine

## 2016-06-17 ENCOUNTER — Encounter: Payer: Self-pay | Admitting: Internal Medicine

## 2016-06-17 DIAGNOSIS — N183 Chronic kidney disease, stage 3 unspecified: Secondary | ICD-10-CM

## 2016-06-17 DIAGNOSIS — R7989 Other specified abnormal findings of blood chemistry: Secondary | ICD-10-CM

## 2016-06-17 DIAGNOSIS — R627 Adult failure to thrive: Secondary | ICD-10-CM

## 2016-06-17 DIAGNOSIS — R945 Abnormal results of liver function studies: Secondary | ICD-10-CM

## 2016-06-17 DIAGNOSIS — D518 Other vitamin B12 deficiency anemias: Secondary | ICD-10-CM

## 2016-06-17 NOTE — Progress Notes (Signed)
Location:  [facility] Room Number: 212D Place of Service:  SNF [phone])  Connor Miller. Connor Hollingshead, MD  Patient Care Team: Etta Grandchild, MD as PCP - General (Internal Medicine)  Extended Emergency Contact Information Primary Emergency Contact: Cord, Wilczynski Address: [address]          [place], [place] [address] [place] Home Phone: [phone] Relation: Spouse Secondary Emergency Contact: [name] [place] Home Phone: [phone] Relation: Relative    Allergies: Patient has no known allergies.  Chief Complaint  Patient presents with  . Medical Management of Chronic Issues    Routine Visit    HPI: Patient is 83 y.o. male who is being seen for routine issues of abnormal LFT's, B12 def amemia, CKD3 and FTT.  Past Medical History:  Diagnosis Date  . Absolute anemia   . Anemia    NOS  . Atherosclerosis of native arteries of extremities with intermittent claudication, unspecified extremity (HCC) 01/20/2013  . BPH (benign prostatic hyperplasia) 05/07/2011  . CAP (community acquired pneumonia) 05/28/2011  . Colon polyps   . Complete rotator cuff tear of left shoulder 03/31/2013  . Constipation 06/18/2011  . Dementia   . Dermatitis, atopic 08/05/2012  . Diverticulosis   . Essential hypertension, benign 08/05/2010  . Failure to thrive in adult 02/14/2014  . Hypertension   . Hypothyroidism 08/05/2010  . Lumbago 02/08/2015  . OAB (overactive bladder) 03/24/2012  . Post-traumatic wound infection 03/20/2015    Past Surgical History:  Procedure Laterality Date  . COLONOSCOPY     multiple  . EYE SURGERY    . HEMORRHOID SURGERY  1972  . INGUINAL HERNIA REPAIR  04/1995  . VITRECTOMY  07/1990   for macular hole    Allergies as of 06/17/2016   No Known Allergies     Medication List       Accurate as of 06/17/16 11:59 PM. Always use your most recent med list.          acetaminophen 325 MG  tablet Commonly known as:  TYLENOL Take 2 tablets (650 mg total) by mouth every 6 (six) hours as needed for mild pain (or Fever >/= 101).   cyanocobalamin 1000 MCG/ML injection Commonly known as:  (VITAMIN B-12) Inject 1,000 mcg into the muscle every 30 (thirty) days.   dorzolamide-timolol 22.3-6.8 MG/ML ophthalmic solution Commonly known as:  COSOPT Place 1 drop into the left eye 2 (two) times daily.   feeding supplement (ENSURE ENLIVE) Liqd Take 237 mLs by mouth 2 (two) times daily between meals.   feeding supplement (PRO-STAT SUGAR FREE 64) Liqd Take 30 mLs by mouth 2 (two) times daily.   ipratropium-albuterol 0.5-2.5 (3) MG/3ML Soln Commonly known as:  DUONEB Take 3 mLs by nebulization every 4 (four) hours as needed.   levothyroxine 75 MCG tablet Commonly known as:  SYNTHROID, LEVOTHROID Take 75 mcg by mouth daily before breakfast.   LORazepam 0.5 MG tablet Commonly known as:  ATIVAN Take 1 tablet (0.5 mg total) by mouth 2 (two) times daily. At 6 am and 8 pm.       Meds ordered this encounter  Medications  . levothyroxine (SYNTHROID, LEVOTHROID) 75 MCG tablet    Sig: Take 75 mcg by mouth daily before breakfast.    Immunization History  Administered Date(s) Administered  . Influenza Split 03/19/2011, 03/03/2012  . Influenza Whole 03/14/2010  . Influenza, High Dose Seasonal PF 03/24/2013, 02/08/2015  . Influenza,inj,Quad PF,36+  Mos 02/08/2014  . PPD Test 04/11/2016  . Pneumococcal Conjugate-13 03/24/2013  . Pneumococcal Polysaccharide-23 03/10/2007  . Td 09/08/2007  . Tdap 07/09/2011, 02/27/2015  . Zoster 12/03/2012    Social History  Substance Use Topics  . Smoking status: Never Smoker  . Smokeless tobacco: Never Used     Comment: "few cigarettes when young"  . Alcohol use No     Comment: occasionally,     Review of Systems  UTO 2/2 dementia; nursing without concerns    Vitals:   06/17/16 1154  BP: 116/78  Pulse: 83  Resp: 17  Temp: 98.1 F  (36.7 C)   Body mass index is 18.54 kg/m. Physical Exam  GENERAL APPEARANCE: Alert, min conversant, No acute distress  SKIN: No diaphoresis rash HEENT: Unremarkable RESPIRATORY: Breathing is even, unlabored. Lung sounds are clear   CARDIOVASCULAR: Heart RRR no murmurs, rubs or gallops. No peripheral edema  GASTROINTESTINAL: Abdomen is soft, non-tender, not distended w/ normal bowel sounds.  GENITOURINARY: Bladder non tender, not distended  MUSCULOSKELETAL: very thin NEUROLOGIC: Cranial nerves 2-12 grossly intact. Moves all extremities PSYCHIATRIC - dementia, no behavioral issues  Patient Active Problem List   Diagnosis Date Noted  . Decubitus ulcer of back, stage 2 04/14/2016  . Rhabdomyolysis 04/02/2016  . Hypoxia 04/02/2016  . Decubitus ulcer 04/02/2016  . Dementia 04/02/2016  . CKD (chronic kidney disease), stage III 04/02/2016  . Macrocytic anemia with vitamin B12 deficiency 04/02/2016  . Thrombocytopenia (HCC) 04/02/2016  . Acute respiratory failure with hypoxia (HCC) 04/02/2016  . Pneumonia 04/02/2016  . Abnormal LFTs 04/02/2016  . AKI (acute kidney injury) (HCC)   . Aspiration pneumonia (HCC)   . Non-traumatic rhabdomyolysis   . Absolute anemia   . Post-traumatic wound infection 03/20/2015  . Lumbago 02/08/2015  . Hip pain, bilateral 02/08/2015  . Failure to thrive in adult 02/14/2014  . Complete rotator cuff tear of left shoulder 03/31/2013  . Atherosclerosis of native arteries of the extremities with intermittent claudication 01/20/2013  . Dermatitis, atopic 08/05/2012  . OAB (overactive bladder) 03/24/2012  . Constipation 06/18/2011  . CAP (community acquired pneumonia) 05/28/2011  . BPH (benign prostatic hyperplasia) 05/07/2011  . Hypothyroidism 08/05/2010  . ANEMIA, B12 DEFICIENCY 08/05/2010  . Essential hypertension, benign 08/05/2010    CMP     Component Value Date/Time   NA 142 05/27/2016   K 4.5 05/27/2016   CL 99 (L) 04/11/2016 0509   CO2 31  04/11/2016 0509   GLUCOSE 87 04/11/2016 0509   BUN 26 (A) 05/27/2016   CREATININE 0.8 05/27/2016   CREATININE 1.26 (H) 04/11/2016 0509   CALCIUM 8.2 (L) 04/11/2016 0509   PROT 5.1 (L) 04/11/2016 0509   ALBUMIN 2.5 (L) 04/11/2016 0509   AST 16 05/27/2016   ALT 12 05/27/2016   ALKPHOS 95 05/27/2016   BILITOT 0.8 04/11/2016 0509   GFRNONAA 48 (L) 04/11/2016 0509   GFRAA 56 (L) 04/11/2016 0509    Recent Labs  04/09/16 0509  04/10/16 0452 04/11/16 04/11/16 0509 05/27/16  NA 135  < > 137 138 138 142  K 4.0  < > 3.5  --  3.3* 4.5  CL 105  --  103  --  99*  --   CO2 26  --  27  --  31  --   GLUCOSE 91  --  81  --  87  --   BUN 22*  < > 21* 26* 26* 26*  CREATININE 1.08  1.11  < >  1.03 1.3 1.26* 0.8  CALCIUM 7.9*  --  8.0*  --  8.2*  --   < > = values in this interval not displayed.  Recent Labs  04/03/16 0424  04/10/16 0452 04/11/16 0509 05/27/16  AST 121*  < > 29 24 16   ALT 75*  < > 34 31 12  ALKPHOS 32*  < > 63 72 95  BILITOT 1.1  --  0.9 0.8  --   PROT 5.5*  --  4.9* 5.1*  --   ALBUMIN 3.2*  --  2.5* 2.5*  --   < > = values in this interval not displayed.  Recent Labs  04/02/16 1640  04/03/16 0424  04/06/16 0533  04/09/16 0509 04/11/16 04/11/16 0509 05/27/16  WBC 4.7  --  3.8*  < > 6.1  < > 8.7 10.3 10.3 6.9  NEUTROABS 3.5  --  2.9  --   --   --   --   --   --   --   HGB 12.3*  --  10.2*  < > 9.3*  < > 8.9*  --  8.9* 12.0*  HCT 36.0*  < > 29.3*  < > 27.3*  < > 25.8*  --  26.0* 37*  MCV 103.2*  --  103.9*  --  103.0*  --  105.3*  --  104.4*  --   PLT 113*  --  90*  < > 120*  < > 203  --  288 209  < > = values in this interval not displayed. No results for input(s): CHOL, LDLCALC, TRIG in the last 8760 hours.  Invalid input(s): HCL No results found for: [facility] Lab Results  Component Value Date   TSH 33.990 (H) 04/02/2016   Lab Results  Component Value Date   HGBA1C 5.5 05/07/2011   Lab Results  Component Value Date   CHOL 148 11/08/2014   HDL  54.10 11/08/2014   LDLCALC 79 11/08/2014   TRIG 73.0 11/08/2014   CHOLHDL 3 11/08/2014    Significant Diagnostic Results in last 30 days:  No results found.  Assessment and Plan  Abnormal LFTs In 04/2016 LFT's were normal; will cont to monitor at intervals  ANEMIA, B12 DEFICIENCY In 05/2016 Hb 12, much improved from prior; will cont b12 1000 mcg IM monthly  CKD (chronic kidney disease), stage III BUN 26/ Cr 0.8 which is improved from ; will monitor at intervals  Failure to thrive in adult Slow steady decline but still eating and talking ; will cont supportive care    Connor Hanks, MD

## 2016-06-19 ENCOUNTER — Non-Acute Institutional Stay (SKILLED_NURSING_FACILITY): Payer: Medicare Other | Admitting: Internal Medicine

## 2016-06-19 DIAGNOSIS — Z515 Encounter for palliative care: Secondary | ICD-10-CM | POA: Diagnosis not present

## 2016-06-20 ENCOUNTER — Encounter: Payer: Self-pay | Admitting: Internal Medicine

## 2016-06-21 ENCOUNTER — Encounter: Payer: Self-pay | Admitting: Internal Medicine

## 2016-06-21 NOTE — Assessment & Plan Note (Signed)
BUN 26/ Cr 0.8 which is improved from ; will monitor at intervals

## 2016-06-21 NOTE — Assessment & Plan Note (Signed)
In 04/2016 LFT's were normal; will cont to monitor at intervals

## 2016-06-21 NOTE — Assessment & Plan Note (Signed)
Slow steady decline but still eating and talking ; will cont supportive care

## 2016-06-21 NOTE — Assessment & Plan Note (Signed)
In 05/2016 Hb 12, much improved from prior; will cont b12 1000 mcg IM monthly

## 2016-07-10 DIAGNOSIS — 419620001 Death: Secondary | SNOMED CT

## 2016-07-10 NOTE — Progress Notes (Signed)
Location:  [facility] Room Number: 212D Place of Service:  SNF [phone])  Randon Goldsmithnne D. Lyn HollingsheadAlexander, MD  Patient Care Team: Etta Grandchildhomas L Jones, MD as PCP - General (Internal Medicine)  Extended Emergency Contact Information Primary Emergency Contact: Erline LevineShaw,Helen E Address: [address]          [place], [place] [address] [place] Home Phone: [phone] Relation: Spouse Secondary Emergency Contact: Cindie LarocheWeaver,Betty  [place] Home Phone: [phone] Relation: Relative    Allergies: Patient has no known allergies.  Chief Complaint  Patient presents with  . Acute Visit    Acute    HPI: Patient is 83 y.o. male who was speaking and eating in the dining room when the DON noted pt not speaking and breathing in a different way. He was taken to his room and put to bed and the nurse came to get me believing that pt was acutely dying.  Past Medical History:  Diagnosis Date  . Absolute anemia   . Anemia    NOS  . Atherosclerosis of native arteries of extremities with intermittent claudication, unspecified extremity (HCC) 01/20/2013  . BPH (benign prostatic hyperplasia) 05/07/2011  . CAP (community acquired pneumonia) 05/28/2011  . Colon polyps   . Complete rotator cuff tear of left shoulder 03/31/2013  . Constipation 06/18/2011  . Dementia   . Dermatitis, atopic 08/05/2012  . Diverticulosis   . Essential hypertension, benign 08/05/2010  . Failure to thrive in adult 02/14/2014  . Hypertension   . Hypothyroidism 08/05/2010  . Lumbago 02/08/2015  . OAB (overactive bladder) 03/24/2012  . Post-traumatic wound infection 03/20/2015    Past Surgical History:  Procedure Laterality Date  . COLONOSCOPY     multiple  . EYE SURGERY    . HEMORRHOID SURGERY  1972  . INGUINAL HERNIA REPAIR  04/1995  . VITRECTOMY  07/1990   for macular hole    Allergies as of 2017-06-07   No Known Allergies     Medication List       Accurate as  of 2016-09-08 11:59 PM. Always use your most recent med list.          acetaminophen 325 MG tablet Commonly known as:  TYLENOL Take 2 tablets (650 mg total) by mouth every 6 (six) hours as needed for mild pain (or Fever >/= 101).   cyanocobalamin 1000 MCG/ML injection Commonly known as:  (VITAMIN B-12) Inject 1,000 mcg into the muscle every 30 (thirty) days.   dorzolamide-timolol 22.3-6.8 MG/ML ophthalmic solution Commonly known as:  COSOPT Place 1 drop into the left eye 2 (two) times daily.   feeding supplement (ENSURE ENLIVE) Liqd Take 237 mLs by mouth 2 (two) times daily between meals.   feeding supplement (PRO-STAT SUGAR FREE 64) Liqd Take 30 mLs by mouth 2 (two) times daily.   ipratropium-albuterol 0.5-2.5 (3) MG/3ML Soln Commonly known as:  DUONEB Take 3 mLs by nebulization every 4 (four) hours as needed.   levothyroxine 75 MCG tablet Commonly known as:  SYNTHROID, LEVOTHROID Take 75 mcg by mouth daily before breakfast.   LORazepam 0.5 MG tablet Commonly known as:  ATIVAN Take 1 tablet (0.5 mg total) by mouth 2 (two) times daily. At 6 am and 8 pm.       No orders of the defined types were placed in this encounter.   Immunization History  Administered Date(s) Administered  . Influenza Split 03/19/2011, 03/03/2012  . Influenza Whole 03/14/2010  . Influenza, High  Dose Seasonal PF 03/24/2013, 02/08/2015  . Influenza,inj,Quad PF,36+ Mos 02/08/2014  . PPD Test 04/11/2016  . Pneumococcal Conjugate-13 03/24/2013  . Pneumococcal Polysaccharide-23 03/10/2007  . Td 09/08/2007  . Tdap 07/09/2011, 02/27/2015  . Zoster 12/03/2012    Social History  Substance Use Topics  . Smoking status: Never Smoker  . Smokeless tobacco: Never Used     Comment: "few cigarettes when young"  . Alcohol use No     Comment: occasionally,     Review of Systems  UTO 2/2    Vitals:   2016/07/19 1436  BP: 116/78  Pulse: 83  Resp: 17  Temp: 98.1 F (36.7 C)   Body mass index is  18.54 kg/m. Physical Exam  GENERAL APPEARANCE: unresponsive No acute distress  SKIN: No diaphoresis rash HEENT: Unremarkable RESPIRATORY: Breathing is even, minmally labored. Lung sounds are decreased   CARDIOVASCULAR: Heart RRR no murmurs, rubs or gallops. No peripheral edema ; no radial pulse, femoral pulse is present GASTROINTESTINAL: Abdomen is soft, non-tender, not distended w no  bowel sounds.  GENITOURINARY: Bladder non tender, not distended  MUSCULOSKELETAL:very thin NEUROLOGIC: pupils not reactive, pt unconscious PSYCHIATRIC: n/a  Patient Active Problem List   Diagnosis Date Noted  . Decubitus ulcer of back, stage 2 04/14/2016  . Rhabdomyolysis 04/02/2016  . Hypoxia 04/02/2016  . Decubitus ulcer 04/02/2016  . Dementia 04/02/2016  . CKD (chronic kidney disease), stage III 04/02/2016  . Macrocytic anemia with vitamin B12 deficiency 04/02/2016  . Thrombocytopenia (HCC) 04/02/2016  . Acute respiratory failure with hypoxia (HCC) 04/02/2016  . Pneumonia 04/02/2016  . Abnormal LFTs 04/02/2016  . AKI (acute kidney injury) (HCC)   . Aspiration pneumonia (HCC)   . Non-traumatic rhabdomyolysis   . Absolute anemia   . Post-traumatic wound infection 03/20/2015  . Lumbago 02/08/2015  . Hip pain, bilateral 02/08/2015  . Failure to thrive in adult 02/14/2014  . Complete rotator cuff tear of left shoulder 03/31/2013  . Atherosclerosis of native arteries of the extremities with intermittent claudication 01/20/2013  . Dermatitis, atopic 08/05/2012  . OAB (overactive bladder) 03/24/2012  . Constipation 06/18/2011  . CAP (community acquired pneumonia) 05/28/2011  . BPH (benign prostatic hyperplasia) 05/07/2011  . Hypothyroidism 08/05/2010  . Essential hypertension, benign 08/05/2010    CMP     Component Value Date/Time   NA 142 05/27/2016   K 4.5 05/27/2016   CL 99 (L) 04/11/2016 0509   CO2 31 04/11/2016 0509   GLUCOSE 87 04/11/2016 0509   BUN 26 (A) 05/27/2016    CREATININE 0.8 05/27/2016   CREATININE 1.26 (H) 04/11/2016 0509   CALCIUM 8.2 (L) 04/11/2016 0509   PROT 5.1 (L) 04/11/2016 0509   ALBUMIN 2.5 (L) 04/11/2016 0509   AST 16 05/27/2016   ALT 12 05/27/2016   ALKPHOS 95 05/27/2016   BILITOT 0.8 04/11/2016 0509   GFRNONAA 48 (L) 04/11/2016 0509   GFRAA 56 (L) 04/11/2016 0509    Recent Labs  04/09/16 0509  04/10/16 0452 04/11/16 04/11/16 0509 05/27/16  NA 135  < > 137 138 138 142  K 4.0  < > 3.5  --  3.3* 4.5  CL 105  --  103  --  99*  --   CO2 26  --  27  --  31  --   GLUCOSE 91  --  81  --  87  --   BUN 22*  < > 21* 26* 26* 26*  CREATININE 1.08  1.11  < > 1.03 1.3  1.26* 0.8  CALCIUM 7.9*  --  8.0*  --  8.2*  --   < > = values in this interval not displayed.  Recent Labs  04/03/16 0424  04/10/16 0452 04/11/16 0509 05/27/16  AST 121*  < > 29 24 16   ALT 75*  < > 34 31 12  ALKPHOS 32*  < > 63 72 95  BILITOT 1.1  --  0.9 0.8  --   PROT 5.5*  --  4.9* 5.1*  --   ALBUMIN 3.2*  --  2.5* 2.5*  --   < > = values in this interval not displayed.  Recent Labs  04/02/16 1640  04/03/16 0424  04/06/16 0533  04/09/16 0509 04/11/16 04/11/16 0509 05/27/16  WBC 4.7  --  3.8*  < > 6.1  < > 8.7 10.3 10.3 6.9  NEUTROABS 3.5  --  2.9  --   --   --   --   --   --   --   HGB 12.3*  --  10.2*  < > 9.3*  < > 8.9*  --  8.9* 12.0*  HCT 36.0*  < > 29.3*  < > 27.3*  < > 25.8*  --  26.0* 37*  MCV 103.2*  --  103.9*  --  103.0*  --  105.3*  --  104.4*  --   PLT 113*  --  90*  < > 120*  < > 203  --  288 209  < > = values in this interval not displayed. No results for input(s): CHOL, LDLCALC, TRIG in the last 8760 hours.  Invalid input(s): HCL No results found for: [facility] Lab Results  Component Value Date   TSH 33.990 (H) 04/02/2016   Lab Results  Component Value Date   HGBA1C 5.5 05/07/2011   Lab Results  Component Value Date   CHOL 148 11/08/2014   HDL 54.10 11/08/2014   LDLCALC 79 11/08/2014   TRIG 73.0 11/08/2014   CHOLHDL 3  11/08/2014    Significant Diagnostic Results in last 30 days:  No results found.  Assessment and Plan  CARE OF DYING/DEATH -  RP was made aware, she is coming; she asked we not tell pt's wife, also demented, as she can be quite dramatic; by the time I  Returned to the room pt's breathing was decreasing in rate. I sat with pt until his breathing stopped. He was pronounced at 1331. I called the RP and made her aware.   Time spent >35 min  [name]. [name], MD

## 2016-07-10 DEATH — deceased
